# Patient Record
Sex: Female | Born: 1960 | Race: Black or African American | Hispanic: No | Marital: Single | Smoking: Former smoker
Health system: Southern US, Community
[De-identification: ages and names within clinical notes are randomized; demographics above are authoritative.]

## PROBLEM LIST (undated history)

## (undated) DIAGNOSIS — I10 Essential (primary) hypertension: Secondary | ICD-10-CM

## (undated) DIAGNOSIS — B977 Papillomavirus as the cause of diseases classified elsewhere: Secondary | ICD-10-CM

## (undated) DIAGNOSIS — M1990 Unspecified osteoarthritis, unspecified site: Secondary | ICD-10-CM

## (undated) DIAGNOSIS — K219 Gastro-esophageal reflux disease without esophagitis: Secondary | ICD-10-CM

## (undated) DIAGNOSIS — R011 Cardiac murmur, unspecified: Secondary | ICD-10-CM

## (undated) DIAGNOSIS — N939 Abnormal uterine and vaginal bleeding, unspecified: Secondary | ICD-10-CM

## (undated) DIAGNOSIS — E785 Hyperlipidemia, unspecified: Secondary | ICD-10-CM

## (undated) DIAGNOSIS — Z86010 Personal history of colonic polyps: Secondary | ICD-10-CM

## (undated) DIAGNOSIS — E119 Type 2 diabetes mellitus without complications: Secondary | ICD-10-CM

## (undated) DIAGNOSIS — F1910 Other psychoactive substance abuse, uncomplicated: Secondary | ICD-10-CM

## (undated) DIAGNOSIS — T7840XA Allergy, unspecified, initial encounter: Secondary | ICD-10-CM

## (undated) HISTORY — DX: Papillomavirus as the cause of diseases classified elsewhere: B97.7

## (undated) HISTORY — DX: Abnormal uterine and vaginal bleeding, unspecified: N93.9

## (undated) HISTORY — DX: Personal history of colonic polyps: Z86.010

## (undated) HISTORY — DX: Allergy, unspecified, initial encounter: T78.40XA

## (undated) HISTORY — DX: Essential (primary) hypertension: I10

## (undated) HISTORY — DX: Hyperlipidemia, unspecified: E78.5

## (undated) HISTORY — DX: Unspecified osteoarthritis, unspecified site: M19.90

## (undated) HISTORY — DX: Other psychoactive substance abuse, uncomplicated: F19.10

## (undated) HISTORY — DX: Type 2 diabetes mellitus without complications: E11.9

## (undated) HISTORY — PX: TONSILLECTOMY: SUR1361

## (undated) HISTORY — DX: Gastro-esophageal reflux disease without esophagitis: K21.9

## (undated) HISTORY — DX: Cardiac murmur, unspecified: R01.1

---

## 1999-01-10 ENCOUNTER — Emergency Department (HOSPITAL_COMMUNITY): Admission: EM | Admit: 1999-01-10 | Discharge: 1999-01-11 | Payer: Self-pay

## 1999-02-26 ENCOUNTER — Emergency Department (HOSPITAL_COMMUNITY): Admission: EM | Admit: 1999-02-26 | Discharge: 1999-02-26 | Payer: Self-pay | Admitting: Emergency Medicine

## 2000-04-25 ENCOUNTER — Emergency Department (HOSPITAL_COMMUNITY): Admission: EM | Admit: 2000-04-25 | Discharge: 2000-04-25 | Payer: Self-pay | Admitting: *Deleted

## 2001-04-19 ENCOUNTER — Emergency Department (HOSPITAL_COMMUNITY): Admission: EM | Admit: 2001-04-19 | Discharge: 2001-04-19 | Payer: Self-pay | Admitting: Internal Medicine

## 2001-12-15 ENCOUNTER — Ambulatory Visit (HOSPITAL_COMMUNITY): Admission: RE | Admit: 2001-12-15 | Discharge: 2001-12-15 | Payer: Self-pay | Admitting: Radiology

## 2002-05-09 ENCOUNTER — Emergency Department (HOSPITAL_COMMUNITY): Admission: EM | Admit: 2002-05-09 | Discharge: 2002-05-09 | Payer: Self-pay | Admitting: Emergency Medicine

## 2002-11-03 ENCOUNTER — Encounter: Admission: RE | Admit: 2002-11-03 | Discharge: 2002-11-03 | Payer: Self-pay | Admitting: *Deleted

## 2002-11-03 ENCOUNTER — Encounter (INDEPENDENT_AMBULATORY_CARE_PROVIDER_SITE_OTHER): Payer: Self-pay | Admitting: *Deleted

## 2002-11-03 ENCOUNTER — Other Ambulatory Visit: Admission: RE | Admit: 2002-11-03 | Discharge: 2002-11-03 | Payer: Self-pay | Admitting: Family Medicine

## 2002-12-19 ENCOUNTER — Emergency Department (HOSPITAL_COMMUNITY): Admission: EM | Admit: 2002-12-19 | Discharge: 2002-12-19 | Payer: Self-pay | Admitting: Emergency Medicine

## 2004-02-16 ENCOUNTER — Encounter (INDEPENDENT_AMBULATORY_CARE_PROVIDER_SITE_OTHER): Payer: Self-pay | Admitting: *Deleted

## 2004-02-16 ENCOUNTER — Encounter: Admission: RE | Admit: 2004-02-16 | Discharge: 2004-02-16 | Payer: Self-pay | Admitting: Family Medicine

## 2004-03-07 ENCOUNTER — Encounter: Admission: RE | Admit: 2004-03-07 | Discharge: 2004-03-07 | Payer: Self-pay | Admitting: *Deleted

## 2004-03-20 ENCOUNTER — Ambulatory Visit (HOSPITAL_COMMUNITY): Admission: RE | Admit: 2004-03-20 | Discharge: 2004-03-20 | Payer: Self-pay | Admitting: Internal Medicine

## 2004-03-20 ENCOUNTER — Encounter: Admission: RE | Admit: 2004-03-20 | Discharge: 2004-03-20 | Payer: Self-pay | Admitting: Internal Medicine

## 2004-03-21 ENCOUNTER — Encounter: Admission: RE | Admit: 2004-03-21 | Discharge: 2004-03-21 | Payer: Self-pay | Admitting: Internal Medicine

## 2004-03-22 ENCOUNTER — Encounter: Admission: RE | Admit: 2004-03-22 | Discharge: 2004-03-22 | Payer: Self-pay | Admitting: Internal Medicine

## 2004-03-27 ENCOUNTER — Encounter: Admission: RE | Admit: 2004-03-27 | Discharge: 2004-03-27 | Payer: Self-pay | Admitting: Internal Medicine

## 2004-05-16 ENCOUNTER — Ambulatory Visit: Payer: Self-pay

## 2004-05-22 ENCOUNTER — Ambulatory Visit (HOSPITAL_COMMUNITY): Admission: RE | Admit: 2004-05-22 | Discharge: 2004-05-22 | Payer: Self-pay | Admitting: Internal Medicine

## 2004-05-24 ENCOUNTER — Ambulatory Visit: Payer: Self-pay | Admitting: Internal Medicine

## 2004-11-22 ENCOUNTER — Ambulatory Visit: Payer: Self-pay | Admitting: Internal Medicine

## 2005-01-09 ENCOUNTER — Emergency Department (HOSPITAL_COMMUNITY): Admission: EM | Admit: 2005-01-09 | Discharge: 2005-01-09 | Payer: Self-pay | Admitting: Emergency Medicine

## 2005-03-21 ENCOUNTER — Ambulatory Visit: Payer: Self-pay | Admitting: Internal Medicine

## 2005-05-02 ENCOUNTER — Ambulatory Visit (HOSPITAL_COMMUNITY): Admission: RE | Admit: 2005-05-02 | Discharge: 2005-05-02 | Payer: Self-pay | Admitting: Internal Medicine

## 2005-05-02 ENCOUNTER — Ambulatory Visit: Payer: Self-pay | Admitting: Internal Medicine

## 2005-05-06 ENCOUNTER — Ambulatory Visit: Payer: Self-pay | Admitting: Internal Medicine

## 2005-06-28 ENCOUNTER — Emergency Department (HOSPITAL_COMMUNITY): Admission: EM | Admit: 2005-06-28 | Discharge: 2005-06-28 | Payer: Self-pay | Admitting: Emergency Medicine

## 2005-09-29 ENCOUNTER — Ambulatory Visit (HOSPITAL_COMMUNITY): Admission: RE | Admit: 2005-09-29 | Discharge: 2005-09-29 | Payer: Self-pay | Admitting: Internal Medicine

## 2005-12-29 ENCOUNTER — Ambulatory Visit: Payer: Self-pay | Admitting: Internal Medicine

## 2006-01-05 ENCOUNTER — Ambulatory Visit: Payer: Self-pay | Admitting: Internal Medicine

## 2006-03-16 ENCOUNTER — Ambulatory Visit: Payer: Self-pay | Admitting: Internal Medicine

## 2006-09-30 ENCOUNTER — Ambulatory Visit (HOSPITAL_COMMUNITY): Admission: RE | Admit: 2006-09-30 | Discharge: 2006-09-30 | Payer: Self-pay | Admitting: Internal Medicine

## 2006-10-12 ENCOUNTER — Ambulatory Visit: Payer: Self-pay | Admitting: Internal Medicine

## 2006-10-12 ENCOUNTER — Encounter (INDEPENDENT_AMBULATORY_CARE_PROVIDER_SITE_OTHER): Payer: Self-pay | Admitting: Internal Medicine

## 2006-10-12 LAB — CONVERTED CEMR LAB: VLDL: 15 mg/dL (ref 0–40)

## 2006-10-19 DIAGNOSIS — E785 Hyperlipidemia, unspecified: Secondary | ICD-10-CM | POA: Insufficient documentation

## 2006-10-19 DIAGNOSIS — M1990 Unspecified osteoarthritis, unspecified site: Secondary | ICD-10-CM

## 2006-10-19 DIAGNOSIS — I10 Essential (primary) hypertension: Secondary | ICD-10-CM

## 2006-10-19 DIAGNOSIS — F1910 Other psychoactive substance abuse, uncomplicated: Secondary | ICD-10-CM

## 2006-10-19 DIAGNOSIS — B977 Papillomavirus as the cause of diseases classified elsewhere: Secondary | ICD-10-CM

## 2006-10-19 DIAGNOSIS — H18609 Keratoconus, unspecified, unspecified eye: Secondary | ICD-10-CM | POA: Insufficient documentation

## 2006-10-21 ENCOUNTER — Ambulatory Visit: Payer: Self-pay | Admitting: Internal Medicine

## 2006-10-21 ENCOUNTER — Encounter (INDEPENDENT_AMBULATORY_CARE_PROVIDER_SITE_OTHER): Payer: Self-pay | Admitting: Unknown Physician Specialty

## 2006-10-27 ENCOUNTER — Encounter (INDEPENDENT_AMBULATORY_CARE_PROVIDER_SITE_OTHER): Payer: Self-pay | Admitting: Unknown Physician Specialty

## 2006-11-19 LAB — CONVERTED CEMR LAB: Pap Smear: NORMAL

## 2006-11-27 ENCOUNTER — Telehealth (INDEPENDENT_AMBULATORY_CARE_PROVIDER_SITE_OTHER): Payer: Self-pay | Admitting: *Deleted

## 2007-01-18 ENCOUNTER — Telehealth (INDEPENDENT_AMBULATORY_CARE_PROVIDER_SITE_OTHER): Payer: Self-pay | Admitting: *Deleted

## 2007-01-25 ENCOUNTER — Telehealth: Payer: Self-pay | Admitting: *Deleted

## 2007-02-14 ENCOUNTER — Emergency Department (HOSPITAL_COMMUNITY): Admission: EM | Admit: 2007-02-14 | Discharge: 2007-02-14 | Payer: Self-pay | Admitting: Family Medicine

## 2007-04-19 ENCOUNTER — Encounter (INDEPENDENT_AMBULATORY_CARE_PROVIDER_SITE_OTHER): Payer: Self-pay | Admitting: *Deleted

## 2007-04-19 ENCOUNTER — Ambulatory Visit: Payer: Self-pay | Admitting: Internal Medicine

## 2007-04-27 ENCOUNTER — Ambulatory Visit: Payer: Self-pay | Admitting: *Deleted

## 2007-04-27 ENCOUNTER — Encounter (INDEPENDENT_AMBULATORY_CARE_PROVIDER_SITE_OTHER): Payer: Self-pay | Admitting: *Deleted

## 2007-04-28 LAB — CONVERTED CEMR LAB
Alkaline Phosphatase: 64 units/L (ref 39–117)
Calcium: 8.8 mg/dL (ref 8.4–10.5)
Cholesterol: 148 mg/dL (ref 0–200)
Creatinine, Ser: 0.64 mg/dL (ref 0.40–1.20)
Glucose, Bld: 100 mg/dL — ABNORMAL HIGH (ref 70–99)
Potassium: 3.2 meq/L — ABNORMAL LOW (ref 3.5–5.3)
Triglycerides: 66 mg/dL (ref <150)
VLDL: 13 mg/dL (ref 0–40)

## 2007-06-08 ENCOUNTER — Encounter (INDEPENDENT_AMBULATORY_CARE_PROVIDER_SITE_OTHER): Payer: Self-pay | Admitting: *Deleted

## 2007-06-08 ENCOUNTER — Ambulatory Visit: Payer: Self-pay | Admitting: Hospitalist

## 2007-06-10 LAB — CONVERTED CEMR LAB
ALT: 17 units/L (ref 0–35)
CO2: 30 meq/L (ref 19–32)
Calcium: 8.7 mg/dL (ref 8.4–10.5)
Chloride: 101 meq/L (ref 96–112)
Creatinine, Ser: 0.69 mg/dL (ref 0.40–1.20)
Glucose, Bld: 110 mg/dL — ABNORMAL HIGH (ref 70–99)
Potassium: 3.3 meq/L — ABNORMAL LOW (ref 3.5–5.3)
Total Protein: 6.7 g/dL (ref 6.0–8.3)

## 2007-07-19 ENCOUNTER — Encounter (INDEPENDENT_AMBULATORY_CARE_PROVIDER_SITE_OTHER): Payer: Self-pay | Admitting: *Deleted

## 2007-07-19 ENCOUNTER — Telehealth (INDEPENDENT_AMBULATORY_CARE_PROVIDER_SITE_OTHER): Payer: Self-pay | Admitting: *Deleted

## 2007-07-30 ENCOUNTER — Telehealth (INDEPENDENT_AMBULATORY_CARE_PROVIDER_SITE_OTHER): Payer: Self-pay | Admitting: *Deleted

## 2007-07-30 ENCOUNTER — Encounter (INDEPENDENT_AMBULATORY_CARE_PROVIDER_SITE_OTHER): Payer: Self-pay | Admitting: *Deleted

## 2007-07-30 ENCOUNTER — Ambulatory Visit: Payer: Self-pay | Admitting: Internal Medicine

## 2007-07-30 LAB — CONVERTED CEMR LAB
CO2: 30 meq/L (ref 19–32)
Calcium: 9.3 mg/dL (ref 8.4–10.5)
Chloride: 100 meq/L (ref 96–112)
Magnesium: 2.1 mg/dL (ref 1.5–2.5)

## 2007-10-13 ENCOUNTER — Ambulatory Visit (HOSPITAL_COMMUNITY): Admission: RE | Admit: 2007-10-13 | Discharge: 2007-10-13 | Payer: Self-pay | Admitting: *Deleted

## 2007-11-03 ENCOUNTER — Ambulatory Visit: Payer: Self-pay | Admitting: Internal Medicine

## 2007-11-03 ENCOUNTER — Encounter (INDEPENDENT_AMBULATORY_CARE_PROVIDER_SITE_OTHER): Payer: Self-pay | Admitting: *Deleted

## 2007-11-03 LAB — CONVERTED CEMR LAB
Calcium: 8.6 mg/dL (ref 8.4–10.5)
Glucose, Bld: 96 mg/dL (ref 70–99)
Sodium: 139 meq/L (ref 135–145)

## 2007-11-10 ENCOUNTER — Ambulatory Visit: Payer: Self-pay | Admitting: Internal Medicine

## 2007-11-10 ENCOUNTER — Encounter (INDEPENDENT_AMBULATORY_CARE_PROVIDER_SITE_OTHER): Payer: Self-pay | Admitting: *Deleted

## 2008-02-15 ENCOUNTER — Ambulatory Visit: Payer: Self-pay | Admitting: Internal Medicine

## 2008-02-15 ENCOUNTER — Encounter (INDEPENDENT_AMBULATORY_CARE_PROVIDER_SITE_OTHER): Payer: Self-pay | Admitting: *Deleted

## 2008-02-16 LAB — CONVERTED CEMR LAB
AST: 16 units/L (ref 0–37)
Albumin: 4.4 g/dL (ref 3.5–5.2)
Alkaline Phosphatase: 60 units/L (ref 39–117)
Calcium: 9.2 mg/dL (ref 8.4–10.5)
Cholesterol: 180 mg/dL (ref 0–200)
Creatinine, Ser: 0.73 mg/dL (ref 0.40–1.20)
HDL: 74 mg/dL (ref >39)
LDL Cholesterol: 89 mg/dL (ref 0–99)
Total Bilirubin: 0.6 mg/dL (ref 0.3–1.2)
Total CHOL/HDL Ratio: 2.4
Total Protein: 7.5 g/dL (ref 6.0–8.3)
VLDL: 17 mg/dL (ref 0–40)

## 2008-10-31 ENCOUNTER — Telehealth: Payer: Self-pay | Admitting: Internal Medicine

## 2008-11-01 ENCOUNTER — Ambulatory Visit: Payer: Self-pay | Admitting: Internal Medicine

## 2008-11-01 ENCOUNTER — Encounter: Payer: Self-pay | Admitting: Internal Medicine

## 2008-11-01 LAB — CONVERTED CEMR LAB
Alkaline Phosphatase: 69 units/L (ref 39–117)
BUN: 10 mg/dL (ref 6–23)
Chloride: 101 meq/L (ref 96–112)
Creatinine, Ser: 0.71 mg/dL (ref 0.40–1.20)
HDL: 71 mg/dL (ref >39)
LDL Cholesterol: 70 mg/dL (ref 0–99)
Potassium: 3.6 meq/L (ref 3.5–5.3)
Total Bilirubin: 0.3 mg/dL (ref 0.3–1.2)
Total Protein: 7.5 g/dL (ref 6.0–8.3)
Triglycerides: 148 mg/dL (ref <150)

## 2008-11-14 ENCOUNTER — Encounter: Payer: Self-pay | Admitting: Internal Medicine

## 2008-12-12 ENCOUNTER — Ambulatory Visit (HOSPITAL_COMMUNITY): Admission: RE | Admit: 2008-12-12 | Discharge: 2008-12-12 | Payer: Self-pay | Admitting: *Deleted

## 2008-12-12 ENCOUNTER — Encounter: Payer: Self-pay | Admitting: *Deleted

## 2008-12-21 ENCOUNTER — Encounter: Payer: Self-pay | Admitting: Internal Medicine

## 2008-12-21 ENCOUNTER — Ambulatory Visit: Payer: Self-pay | Admitting: Internal Medicine

## 2008-12-21 LAB — CONVERTED CEMR LAB
CO2: 26 meq/L (ref 19–32)
Calcium: 8.8 mg/dL (ref 8.4–10.5)
GFR calc Af Amer: >60 mL/min (ref >60)
GFR calc non Af Amer: >60 mL/min (ref >60)
Potassium: 3.8 meq/L (ref 3.5–5.3)

## 2009-02-13 DIAGNOSIS — N939 Abnormal uterine and vaginal bleeding, unspecified: Secondary | ICD-10-CM

## 2009-02-13 HISTORY — DX: Abnormal uterine and vaginal bleeding, unspecified: N93.9

## 2009-03-06 ENCOUNTER — Ambulatory Visit: Payer: Self-pay | Admitting: Internal Medicine

## 2009-03-06 DIAGNOSIS — N926 Irregular menstruation, unspecified: Secondary | ICD-10-CM

## 2009-03-06 DIAGNOSIS — N939 Abnormal uterine and vaginal bleeding, unspecified: Secondary | ICD-10-CM

## 2009-03-20 ENCOUNTER — Telehealth: Payer: Self-pay | Admitting: Internal Medicine

## 2009-03-26 ENCOUNTER — Telehealth: Payer: Self-pay | Admitting: Internal Medicine

## 2009-04-05 ENCOUNTER — Encounter: Payer: Self-pay | Admitting: Internal Medicine

## 2009-04-06 ENCOUNTER — Ambulatory Visit (HOSPITAL_COMMUNITY): Admission: RE | Admit: 2009-04-06 | Discharge: 2009-04-06 | Payer: Self-pay | Admitting: Obstetrics & Gynecology

## 2009-07-13 ENCOUNTER — Ambulatory Visit: Payer: Self-pay | Admitting: Internal Medicine

## 2009-07-19 ENCOUNTER — Ambulatory Visit: Payer: Self-pay | Admitting: Internal Medicine

## 2009-07-19 LAB — CONVERTED CEMR LAB
ALT: 20 units/L (ref 0–35)
Albumin: 4.5 g/dL (ref 3.5–5.2)
Alkaline Phosphatase: 65 units/L (ref 39–117)
CO2: 26 meq/L (ref 19–32)
Calcium: 9.3 mg/dL (ref 8.4–10.5)
Creatinine, Ser: 0.64 mg/dL (ref 0.40–1.20)
Glucose, Bld: 98 mg/dL (ref 70–99)
Sodium: 139 meq/L (ref 135–145)
Total Bilirubin: 0.4 mg/dL (ref 0.3–1.2)
Total Protein: 7.3 g/dL (ref 6.0–8.3)
VLDL: 16 mg/dL (ref 0–40)

## 2009-07-26 ENCOUNTER — Encounter: Payer: Self-pay | Admitting: Internal Medicine

## 2009-08-28 ENCOUNTER — Telehealth: Payer: Self-pay | Admitting: Internal Medicine

## 2009-11-26 ENCOUNTER — Encounter: Payer: Self-pay | Admitting: Internal Medicine

## 2009-12-03 ENCOUNTER — Ambulatory Visit: Payer: Self-pay | Admitting: Internal Medicine

## 2009-12-04 ENCOUNTER — Encounter: Payer: Self-pay | Admitting: Internal Medicine

## 2009-12-04 LAB — CONVERTED CEMR LAB
ALT: 22 units/L (ref 0–35)
Albumin: 4.3 g/dL (ref 3.5–5.2)
Alkaline Phosphatase: 65 units/L (ref 39–117)
BUN: 8 mg/dL (ref 6–23)
CO2: 27 meq/L (ref 19–32)
Calcium: 9.7 mg/dL (ref 8.4–10.5)
Chloride: 101 meq/L (ref 96–112)
Glucose, Bld: 116 mg/dL — ABNORMAL HIGH (ref 70–99)
Total Protein: 7 g/dL (ref 6.0–8.3)

## 2009-12-18 ENCOUNTER — Ambulatory Visit (HOSPITAL_COMMUNITY): Admission: RE | Admit: 2009-12-18 | Discharge: 2009-12-18 | Payer: Self-pay | Admitting: Internal Medicine

## 2009-12-18 LAB — HM MAMMOGRAPHY

## 2010-03-19 ENCOUNTER — Ambulatory Visit: Payer: Self-pay | Admitting: Internal Medicine

## 2010-03-21 ENCOUNTER — Ambulatory Visit: Payer: Self-pay | Admitting: Internal Medicine

## 2010-03-21 LAB — CONVERTED CEMR LAB
ALT: 16 units/L (ref 0–35)
AST: 17 units/L (ref 0–37)
Albumin: 4.1 g/dL (ref 3.5–5.2)
Alkaline Phosphatase: 56 units/L (ref 39–117)
BUN: 9 mg/dL (ref 6–23)
Calcium: 8.8 mg/dL (ref 8.4–10.5)
Cholesterol: 168 mg/dL (ref 0–200)
Glucose, Bld: 97 mg/dL (ref 70–99)
LDL Cholesterol: 75 mg/dL (ref 0–99)
Potassium: 4.1 meq/L (ref 3.5–5.3)
Total Protein: 6.7 g/dL (ref 6.0–8.3)
Triglycerides: 113 mg/dL (ref <150)

## 2010-03-25 ENCOUNTER — Encounter (INDEPENDENT_AMBULATORY_CARE_PROVIDER_SITE_OTHER): Payer: Self-pay | Admitting: *Deleted

## 2010-03-27 ENCOUNTER — Ambulatory Visit: Payer: Self-pay | Admitting: Internal Medicine

## 2010-06-18 ENCOUNTER — Telehealth: Payer: Self-pay | Admitting: Internal Medicine

## 2010-07-02 ENCOUNTER — Telehealth (INDEPENDENT_AMBULATORY_CARE_PROVIDER_SITE_OTHER): Payer: Self-pay | Admitting: *Deleted

## 2010-07-10 ENCOUNTER — Ambulatory Visit: Payer: Self-pay | Admitting: Internal Medicine

## 2010-09-26 ENCOUNTER — Ambulatory Visit: Admit: 2010-09-26 | Payer: Self-pay

## 2010-10-17 ENCOUNTER — Other Ambulatory Visit: Payer: Self-pay | Admitting: *Deleted

## 2010-10-17 MED ORDER — PRAVASTATIN SODIUM 20 MG PO TABS: 20.0000 mg | ORAL_TABLET | Freq: Every evening | ORAL | Status: DC

## 2010-10-17 NOTE — Progress Notes (Signed)
Summary: Refill/gh  Phone Note Refill Request Message from:  Patient on July 02, 2010 9:01 AM  Refills Requested: Medication #1:  OSCAL 500/200 D-3 500-200 MG-UNIT TABS take 1 tablet twice a day.. Last labs and visit were 03/19/2010 annd 03/21/2010.   Method Requested: Fax to Local Pharmacy Initial call taken by: Angelina Ok RN,  July 02, 2010 9:01 AM  Follow-up for Phone Call        Rx faxed to pharmacy Follow-up by: Mariea Stable MD,  July 02, 2010 9:04 AM    Prescriptions: OSCAL 500/200 D-3 500-200 MG-UNIT TABS (CALCIUM-VITAMIN D) take 1 tablet twice a day.  #60 x 1   Entered and Authorized by:   Mariea Stable MD   Signed by:   Mariea Stable MD on 07/02/2010   Method used:   [facility]        [facility] [phone]* (retail)       [address]       [place], [place]  [address]       Ph: [phone] or [phone]       Fax: [phone]   RxID:   [id number]

## 2010-10-17 NOTE — Assessment & Plan Note (Signed)
Summary: REASSIGNED NEW TO DR/CFB   Vital Signs:  Patient profile:   50 year old female Height:      68.5 inches (173.99 cm) Weight:      197.2 pounds (89.64 kg) BMI:     29.65 Temp:     97.2 degrees F (36.22 degrees C) oral Pulse rate:   81 / minute BP sitting:   141 / 89  (right arm) Cuff size:   regular  Vitals Entered By: Cynda Familia Duncan Dull) (March 19, 2010 3:35 PM) CC: routine f/u, new to md Is Patient Diabetic? No Pain Assessment Patient in pain? no      Nutritional Status BMI of > 30 = obese  Have you ever been in a relationship where you felt threatened, hurt or afraid?No   Does patient need assistance? Functional Status Self care, Social activities Ambulation Normal   Primary Care Provider:  Hartley Barefoot MD  CC:  routine f/u and new to md.  History of Present Illness: Pt is a 50 yo AAF w/ h/o HTN, HL and OA here for a routine f/u and prescription refil. She has no complaints, has been doing very well clinically since her last visit and has been compliant with her meds.        HTN: well controlled and at goal on Duazide. Blood pressure today is 132/84, although she states she did not take her medication earlier today because she ran out but plans to refill her prescription on her way home.  HL: Last lipid panel obtained in 06/2009 was great (total chol-152, HDL-72, LDL-64, triglycerides- 81).  However her statin was changed in 08/2009 to Pravastatin 20mg  and so we will be checking a fasting lipid panel.   Habits & Providers  Alcohol-Tobacco-Diet     Tobacco Status: current     Tobacco Counseling: to quit use of tobacco products     Cigarette Packs/Day: 1/2     Year Quit: 02/11/08   Allergies (verified): No Known Drug Allergies   Impression & Recommendations:  Problem # 1:  HYPERTENSION (ICD-401.9)  HTN: well controlled and at goal on Dyazide. Blood pressure today is 132/84, although she states she did not take her medication earlier today  because she ran out but plans to refill her prescription on her way home.     Her updated medication list for this problem includes:    Dyazide 37.5-25 Mg Caps (Triamterene-hctz) .Marland Kitchen... Take 1 tablet by mouth once a day  BP today: 141/89 Prior BP: 132/73 (12/03/2009)  10 Yr Risk Heart Disease: 7 %  Labs Reviewed: K+: 3.2 (12/03/2009) Creat: : 0.68 (12/03/2009)   Chol: 152 (07/19/2009)   HDL: 72 (07/19/2009)   LDL: 64 (07/19/2009)   TG: 81 (07/19/2009)  Problem # 2:  HYPERLIPIDEMIA (ICD-272.4)  Last lipid panel obtained in 06/2009 was great (total chol-152, HDL-72, LDL-64, triglycerides- 81).  However her statin was changed in 08/2009 to Pravastatin 20mg  and so we will be checking a fasting lipid panel. She  Her updated medication list for this problem includes:    Pravastatin Sodium 20 Mg Tabs (Pravastatin sodium) .Marland Kitchen... Take 1 tablet by mouth daily.  Orders: T-Lipid Profile [phone])  Labs Reviewed: SGOT: 19 (12/03/2009)   SGPT: 22 (12/03/2009)  Lipid Goals: LDL Goal: 160 (03/19/2010)     10 Yr Risk Heart Disease: 7 %   HDL:72 (07/19/2009), 71 (11/01/2008)  LDL:64 (07/19/2009), 70 (11/01/2008)  Chol:152 (07/19/2009), 171 (11/01/2008)  Trig:81 (07/19/2009), 148 (11/01/2008)  Problem # 3:  HEALTH SCREENING (ICD-V70.0) Pap smear (3/11) - Normal Smoking cessation counseling  Complete Medication List: 1)  Pravastatin Sodium 20 Mg Tabs (Pravastatin sodium) .... Take 1 tablet by mouth daily. 2)  Dyazide 37.5-25 Mg Caps (Triamterene-hctz) .... Take 1 tablet by mouth once a day 3)  Oscal 500/200 D-3 500-200 Mg-unit Tabs (Calcium-vitamin d) .... Take 1 tablet twice a day.  Other Orders: T-Hemoccult Card-Multiple (take home) ([id number]) Gastroenterology Referral (GI) T-Comprehensive Metabolic Panel ([id number])  Patient Instructions: 1)  Pls schedule appointment for fasting labs in the next few days. Also schedule an appointment for a followup clinic appointment in 3 months. 2)   Pls schedule an appointment with our smoking cessation counselor.  3)  Call if there are any problems.  Prevention & Chronic Care Immunizations   Influenza vaccine: Not documented   Influenza vaccine deferral: Deferred  (03/19/2010)   Influenza vaccine due: 05/16/2010    Tetanus booster: 03/06/2009: Td   Td booster deferral: Deferred  (03/19/2010)    Pneumococcal vaccine: Not documented   Pneumococcal vaccine deferral: Refused  (03/06/2009)  Colorectal Screening   Hemoccult: Not documented   Hemoccult action/deferral: Ordered  (03/19/2010)    Colonoscopy: Not documented   Colonoscopy action/deferral: GI referral  (03/19/2010)  Other Screening   Pap smear:  Specimen Adequacy: Satisfactory for evaluation.   Interpretation/Result:Negative for intraepithelial Lesion or Malignancy.     (11/26/2009)   Pap smear action/deferral: GYN Referral  (03/19/2010)   Pap smear due: 11/2010    Mammogram: ASSESSMENT: Negative - BI-RADS 1^MM DIGITAL SCREENING  (12/18/2009)   Mammogram action/deferral: Ordered  (12/03/2009)   Mammogram due: 10/2007   Smoking status: current  (03/19/2010)   Smoking cessation counseling: yes  (03/19/2010)  Lipids   Total Cholesterol: 152  (07/19/2009)   Lipid panel action/deferral: Lipid Panel ordered   LDL: 64  (07/19/2009)   LDL Direct: Not documented   HDL: 72  (07/19/2009)   Triglycerides: 81  (07/19/2009)   Lipid panel due: 11/01/2009    SGOT (AST): 19  (12/03/2009)   BMP action: Ordered   SGPT (ALT): 22  (12/03/2009) CMP ordered    Alkaline phosphatase: 65  (12/03/2009)   Total bilirubin: 0.4  (12/03/2009)    Lipid flowsheet reviewed?: Yes   Progress toward LDL goal: At goal  Hypertension   Last Blood Pressure: 141 / 89  (03/19/2010)   Serum creatinine: 0.68  (12/03/2009)   BMP action: Ordered   Serum potassium 3.2  (12/03/2009) CMP ordered    Basic metabolic panel due: 03/06/2010    Hypertension flowsheet reviewed?: Yes   Progress  toward BP goal: At goal  Self-Management Support :   Personal Goals (by the next clinic visit) :      Personal blood pressure goal: 140/90  (03/06/2009)   Patient will work on the following items until the next clinic visit to reach self-care goals:     Medications and monitoring: take my medicines every day  (03/19/2010)     Eating: drink diet soda or water instead of juice or soda, eat more vegetables, eat foods that are low in salt, eat baked foods instead of fried foods  (03/19/2010)     Activity: take a 30 minute walk every day  (03/19/2010)    Hypertension self-management support: Pre-printed educational material, Resources for patients handout, Written self-care plan  (03/19/2010)   Hypertension self-care plan printed.    Lipid self-management support: Pre-printed educational material, Resources for patients handout, Written self-care plan  (03/19/2010)  Lipid self-care plan printed.      Resource handout printed.   Nursing Instructions: GI  prefers am appt Provide Hemoccult cards with instructions (see order)   Process Orders Check Orders Results:     [facility]: ABN not required for this insurance Tests Sent for requisitioning (March 26, 2010 9:53 AM):     03/19/2010: [facility] -- T-Lipid Profile [phone] (signed)     03/19/2010: [facility] -- T-Comprehensive Metabolic Panel [phone] (signed)     Pap Smear  Procedure date:  11/26/2009  Findings:       Specimen Adequacy: Satisfactory for evaluation.   Interpretation/Result:Negative for intraepithelial Lesion or Malignancy.     Comments:      Repeat Pap in 1 year.    Procedures Next Due Date:    Pap Smear: 11/2010

## 2010-10-17 NOTE — Assessment & Plan Note (Signed)
Summary: ACUTE-LEG PAIN(ISAMAH)/CFB   Vital Signs:  Patient profile:   50 year old female Height:      68.5 inches (173.99 cm) Weight:      198.8 pounds (89.64 kg) BMI:     29.65 Temp:     97.7 degrees F (36.50 degrees C) oral Pulse rate:   77 / minute BP sitting:   128 / 71  (left arm) Cuff size:   regular  Vitals Entered By: Theotis Barrio NT II (July 10, 2010 11:58 AM) CC: left leg pain / ? lab work to be done Is Patient Diabetic? No Pain Assessment Patient in pain? yes     Location: left leg Intensity: 8 Type: aching Onset of pain  last thursday  Have you ever been in a relationship where you felt threatened, hurt or afraid?No   Does patient need assistance? Functional Status Self care Ambulation Normal Comments left leg pain / ? lab work to be done    Primary Care Provider:  Hartley Barefoot MD  CC:  left leg pain / ? lab work to be done.  History of Present Illness: 50 yr old woman with pmhx as described below comes to the clinic complaining left leg pain since last thursday. Patient has been to  the clinic before for this pain. In the past was told that it was due to arthritis. Associated with stiffness. Patient has not been taking anything for pain.   Preventive Screening-Counseling & Management  Alcohol-Tobacco     Smoking Status: current     Smoking Cessation Counseling: yes     Packs/Day: 1/2     Year Quit: 02/11/08      Tobacco Counseling: to quit use of tobacco products  Caffeine-Diet-Exercise     Does Patient Exercise: yes     Type of exercise: walk 30 minutes     Times/week: 3  Problems Prior to Update: 1)  Vaginal Bleeding, Abnormal  (ICD-626.9) 2)  Allergic Rhinitis  (ICD-477.9) 3)  Hypertension  (ICD-401.9) 4)  Hyperlipidemia  (ICD-272.4) 5)  Osteoarthritis  (ICD-715.90) 6)  Health Screening  (ICD-V70.0) 7)  Substance Abuse, Multiple  (ICD-305.90) 8)  Hx, Personal, Tobacco Use  (ICD-V15.82) 9)  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 Take 1 Tablet By Mouth Daily. 2)  Dyazide 37.5-25 Mg  Caps (Triamterene-Hctz) .... Take 1 Tablet By Mouth Once A Day 3)  Oscal 500/200 D-3 500-200 Mg-Unit Tabs (Calcium-Vitamin D) .... Take 1 Tablet Twice A Day.  Current Medications (verified): 1)  Pravastatin Sodium 20 Mg Tabs (Pravastatin Sodium) .... Take 1 Tablet By Mouth Daily. 2)  Dyazide 37.5-25 Mg  Caps (Triamterene-Hctz) .... Take 1 Tablet By Mouth Once A Day 3)  Oscal 500/200 D-3 500-200 Mg-Unit Tabs (Calcium-Vitamin D) .... Take 1 Tablet Twice A Day.  Allergies: No Known Drug Allergies  Past History:  Past Medical History: Last updated: 10/21/2006 Hyperlipidemia Hypertension Osteoarthritis H/o substance abuse- Tobacco, marijuana H/o Keratoconus- f/b Dr. 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  Family History: Last updated: 2008-03-05 Father: deceased appendicitis, otherwise healthy Mother: healthy Siblings: healthy  Social History: Last updated: 04/19/2007 Marital Status: divorced Children: 2 girls- 30, 26 1 boy-16 Occupation: Engineer, site-- Server in cafeteria  Risk Factors: Exercise: yes (07/10/2010)  Risk Factors: Smoking Status: current (07/10/2010) Packs/Day: 1/2 (07/10/2010)  Family History: Reviewed history from 02/15/2008 and no changes required. Father: deceased appendicitis, otherwise healthy Mother: healthy Siblings: healthy  Social History: Reviewed history from 04/19/2007 and no changes required. Marital Status: divorced Children: 2 girls- 54, 45 1 boy-16 Occupation: Acupuncturist in [employer]  Review of Systems  The patient denies fever, chest pain, dyspnea on exertion, hemoptysis, abdominal pain, melena, hematochezia, hematuria, muscle weakness, and difficulty walking.     Physical Exam  General:  NAD Mouth:  MMM Neck:  supple.   Lungs:  normal respiratory effort, no intercostal retractions, no accessory muscle use, and normal breath sounds.   Heart:  normal rate and regular rhythm.   Abdomen:  soft, non-tender, normal bowel sounds, and no distention.   Msk:  normal ROM, no joint swelling, no joint warmth, and no redness over joints.   Extremities:  no edema Neurologic:  alert & oriented X3.     Impression & Recommendations:  Problem # 1:  OSTEOARTHRITIS (ICD-715.90) OA left hip. Most likely causing patient's leg pain. Start patient on naproxen and follow up.  Her updated medication list for this problem includes:    Naproxen 500 Mg Tabs (Naproxen) .Marland Kitchen... Take 1 tablet by mouth two times a day as needed for pain  Problem # 2:  HYPERTENSION (ICD-401.9) Controlled. Continue current regimen.  Her updated medication list for this problem includes:    Dyazide 37.5-25 Mg Caps (Triamterene-hctz) .Marland Kitchen... Take 1 tablet by mouth once a day  BP today: 128/21 Prior BP: 141/89 (03/19/2010)  Prior 10 Yr Risk Heart Disease: 7 % (03/19/2010)  Labs Reviewed: K+: 4.1 (03/21/2010) Creat: : 0.67 (03/21/2010)   Chol: 168 (03/21/2010)   HDL: 70 (03/21/2010)   LDL: 75 (03/21/2010)   TG: 113 (03/21/2010)  Complete Medication List: 1)  Pravastatin Sodium 20 Mg Tabs (Pravastatin sodium) .... Take 1 tablet by mouth daily. 2)  Dyazide 37.5-25 Mg Caps (Triamterene-hctz) .... Take 1 tablet by mouth once a day 3)  Oscal 500/200 D-3 500-200 Mg-unit Tabs (Calcium-vitamin d) .... Take 1 tablet twice a day. 4)  Naproxen 500 Mg Tabs (Naproxen) .... Take 1 tablet by mouth two times a day as needed for pain  Patient Instructions: 1)  Please schedule a follow-up appointment in 3 months. 2)  Take all medication as directed.  Prescriptions: NAPROXEN 500 MG TABS (NAPROXEN) Take 1 tablet by mouth two times a day  #30 x 2   Entered and Authorized by:   Laren Everts MD    Signed by:   Laren Everts MD on 07/10/2010   Method used:   Faxed to ...       [facility] (retail)       [address]       [place], [place]  [address]       Ph: [phone]       Fax: [phone]   RxID:   [id number]    Orders Added: 1)  Est. Patient Level III [25366]     Prevention & Chronic Care Immunizations   Influenza vaccine: Not documented   Influenza vaccine deferral: Deferred  (03/19/2010)   Influenza vaccine due: 05/16/2010    Tetanus booster: 03/06/2009: Td   Td booster deferral: Deferred  (03/19/2010)    Pneumococcal vaccine: Not documented   Pneumococcal vaccine deferral: Refused  (03/06/2009)  Colorectal Screening   Hemoccult: Not documented   Hemoccult action/deferral: Ordered  (03/19/2010)    Colonoscopy: Not documented   Colonoscopy action/deferral: GI  referral  (03/19/2010)  Other Screening   Pap smear:  Specimen Adequacy: Satisfactory for evaluation.   Interpretation/Result:Negative for intraepithelial Lesion or Malignancy.     (11/26/2009)   Pap smear action/deferral: GYN Referral  (03/19/2010)   Pap smear due: 11/2010    Mammogram: ASSESSMENT: Negative - BI-RADS 1^MM DIGITAL SCREENING  (12/18/2009)   Mammogram action/deferral: Ordered  (12/03/2009)   Mammogram due: 10/2007   Smoking status: current  (07/10/2010)   Smoking cessation counseling: yes  (07/10/2010)  Lipids   Total Cholesterol: 168  (03/21/2010)   Lipid panel action/deferral: Lipid Panel ordered   LDL: 75  (03/21/2010)   LDL Direct: Not documented   HDL: 70  (03/21/2010)   Triglycerides: 113  (03/21/2010)   Lipid panel due: 11/01/2009    SGOT (AST): 17  (03/21/2010)   BMP action: Ordered   SGPT (ALT): 16  (03/21/2010)   Alkaline phosphatase: 56  (03/21/2010)   Total bilirubin: 0.4  (03/21/2010)    Lipid flowsheet reviewed?: Yes   Progress toward LDL goal: At goal  Hypertension   Last Blood Pressure: 128 / 71   (07/10/2010)   Serum creatinine: 0.67  (03/21/2010)   BMP action: Ordered   Serum potassium 4.1  (03/21/2010)   Basic metabolic panel due: 03/06/2010    Hypertension flowsheet reviewed?: Yes   Progress toward BP goal: At goal  Self-Management Support :   Personal Goals (by the next clinic visit) :      Personal blood pressure goal: 140/90  (03/06/2009)     Personal LDL goal: 100  (07/10/2010)    Patient will work on the following items until the next clinic visit to reach self-care goals:     Medications and monitoring: take my medicines every day, bring all of my medications to every visit  (07/10/2010)     Eating: drink diet soda or water instead of juice or soda, eat more vegetables, use fresh or frozen vegetables, eat baked foods instead of fried foods, eat fruit for snacks and desserts, limit or avoid alcohol  (07/10/2010)     Activity: take a 30 minute walk every day  (07/10/2010)    Hypertension self-management support: Resources for patients handout  (07/10/2010)    Lipid self-management support: Resources for patients handout  (07/10/2010)        Resource handout printed.   Appended Document: ACUTE-LEG PAIN(ISAMAH)/CFB Naproxen Rx called to [facility] pharmacy; pt. was called and made awared.

## 2010-10-17 NOTE — Progress Notes (Signed)
Summary: refill/gg  Phone Note Refill Request  on June 18, 2010 11:15 AM  Refills Requested: Medication #1:  DYAZIDE 37.5-25 MG  CAPS Take 1 tablet by mouth once a day   Last Refilled: 05/19/2010  Method Requested: Electronic Initial call taken by: Merrie Roof RN,  June 18, 2010 11:15 AM  Follow-up for Phone Call        Rx faxed to pharmacy Follow-up by: Mariea Stable MD,  June 18, 2010 11:19 AM    Prescriptions: Ilsa Iha 37.5-25 MG  CAPS (TRIAMTERENE-HCTZ) Take 1 tablet by mouth once a day  #30 x 5   Entered and Authorized by:   Mariea Stable MD   Signed by:   Mariea Stable MD on 06/18/2010   Method used:   [facility]        [facility] [phone]* (retail)       [address]       [place], [place]  [address]       Ph: [phone] or [phone]       Fax: [phone]   RxID:   [id number]

## 2010-10-17 NOTE — Miscellaneous (Signed)
Summary: [facility]  Clinical Lists Changes  Medications: Added new medication of MOVIPREP 100 GM  SOLR (PEG-KCL-NACL-NASULF-NA ASC-C) As per prep instructions. - Signed Rx of MOVIPREP 100 GM  SOLR (PEG-KCL-NACL-NASULF-NA ASC-C) As per prep instructions.;  #1 x 0;  Signed;  Entered by: Ezra Sites RN;  Authorized by: Hilarie Fredrickson MD;  Method used: [facility] [phone]*, [address], [place], [place]  [address], Ph: [phone] or [phone], Fax: [phone] Observations: Added new observation of NKA: T (03/27/2010 12:46)    Prescriptions: MOVIPREP 100 GM  SOLR (PEG-KCL-NACL-NASULF-NA ASC-C) As per prep instructions.  #1 x 0   Entered by:   Ezra Sites RN   Authorized by:   Hilarie Fredrickson MD   Signed by:   Ezra Sites RN on 03/27/2010   Method used:   [facility] to        [facility] [phone]* (retail)       [address]       [place], [place]  [address]       Ph: [phone] or [phone]       Fax: [phone]   RxID:   [id number]

## 2010-10-17 NOTE — Miscellaneous (Signed)
  Clinical Lists Changes Hypokalemia: I will give Klor supplement for 5 days.  Medications: Added new medication of KLOR-CON 10 10 MEQ CR-TABS (POTASSIUM CHLORIDE) Take 2 tablet twice a day for 5 days. - Signed Rx of KLOR-CON 10 10 MEQ CR-TABS (POTASSIUM CHLORIDE) Take 2 tablet twice a day for 5 days.;  #20 x 0;  Signed;  Entered by: Hartley Barefoot MD;  Authorized by: Hartley Barefoot MD;  Method used: Telephoned to [facility], [address] [place], [place], [place]  [address], Ph: [phone], Fax: [phone]    Prescriptions: KLOR-CON 10 10 MEQ CR-TABS (POTASSIUM CHLORIDE) Take 2 tablet twice a day for 5 days.  #20 x 0   Entered and Authorized by:   Hartley Barefoot MD   Signed by:   Hartley Barefoot MD on 12/04/2009   Method used:   Telephoned to ...       [facility] (retail)       [address] [place], [place]  [address]       Ph: [phone]       Fax: [phone]   RxID:   [id number]

## 2010-10-17 NOTE — Letter (Signed)
Summary: [facility] Instructions  [facility]  [address] [place], [place] [address]   Phone: [phone]  Fax: [phone]       Cassandra Park    1961/05/25    MRN: [id number]        Procedure Day /Date:  Friday 04/05/2010     Arrival Time: 8:30 am      Procedure Time: 9:30 am     Location of Procedure:                    _x _  [facility] (4th Floor)                        PREPARATION FOR COLONOSCOPY WITH MOVIPREP   Starting 5 days prior to your procedure Sunday 7/17 do not eat nuts, seeds, popcorn, corn, beans, peas,  salads, or any raw vegetables.  Do not take any fiber supplements (e.g. Metamucil, Citrucel, and Benefiber).  THE DAY BEFORE YOUR PROCEDURE         DATE: Thursday 7/21  1.  Drink clear liquids the entire day-NO SOLID FOOD  2.  Do not drink anything colored red or purple.  Avoid juices with pulp.  No orange juice.  3.  Drink at least 64 oz. (8 glasses) of fluid/clear liquids during the day to prevent dehydration and help the prep work efficiently.  CLEAR LIQUIDS INCLUDE: Water Jello Ice Popsicles Tea (sugar ok, no milk/cream) Powdered fruit flavored drinks Coffee (sugar ok, no milk/cream) Gatorade Juice: apple, white grape, white cranberry  Lemonade Clear bullion, consomm, broth Carbonated beverages (any kind) Strained chicken noodle soup Hard Candy                             4.  In the morning, mix first dose of MoviPrep solution:    Empty 1 Pouch A and 1 Pouch B into the disposable container    Add lukewarm drinking water to the top line of the container. Mix to dissolve    Refrigerate (mixed solution should be used within 24 hrs)  5.  Begin drinking the prep at 5:00 p.m. The MoviPrep container is divided by 4 marks.   Every 15 minutes drink the solution down to the next mark (approximately 8 oz) until the full liter is complete.   6.  Follow completed prep with 16 oz of clear liquid of your choice (Nothing  red or purple).  Continue to drink clear liquids until bedtime.  7.  Before going to bed, mix second dose of MoviPrep solution:    Empty 1 Pouch A and 1 Pouch B into the disposable container    Add lukewarm drinking water to the top line of the container. Mix to dissolve    Refrigerate  THE DAY OF YOUR PROCEDURE      DATE: Friday 7/22  Beginning at 4:30 a.m. (5 hours before procedure):         1. Every 15 minutes, drink the solution down to the next mark (approx 8 oz) until the full liter is complete.  2. Follow completed prep with 16 oz. of clear liquid of your choice.    3. You may drink clear liquids until 7:30 am (2 HOURS BEFORE PROCEDURE).   MEDICATION INSTRUCTIONS  Unless otherwise instructed, you should take regular prescription medications with a small sip of water   as early as possible the morning of  your procedure.    Additional medication instructions:  Do not take Triam/HCTZ day of procedure.         OTHER INSTRUCTIONS  You will need a responsible adult at least 52 years of age to accompany you and drive you home.   This person must remain in the waiting room during your procedure.  Wear loose fitting clothing that is easily removed.  Leave jewelry and other valuables at home.  However, you may wish to bring a book to read or  an iPod/MP3 player to listen to music as you wait for your procedure to start.  Remove all body piercing jewelry and leave at home.  Total time from sign-in until discharge is approximately 2-3 hours.  You should go home directly after your procedure and rest.  You can resume normal activities the  day after your procedure.  The day of your procedure you should not:   Drive   Make legal decisions   Operate machinery   Drink alcohol   Return to work  You will receive specific instructions about eating, activities and medications before you leave.    The above instructions have been reviewed and explained to me by    Ezra Sites RN  March 27, 2010 1:02 PM     I fully understand and can verbalize these instructions _____________________________ Date _________

## 2010-10-17 NOTE — Assessment & Plan Note (Signed)
Summary: CHECKUP/SB.   Vital Signs:  Patient profile:   50 year old female Height:      68.5 inches Weight:      199.7 pounds BMI:     30.03 Temp:     98.1 degrees F oral Pulse rate:   95 / minute BP sitting:   132 / 73  (right arm)  Vitals Entered By: Filomena Jungling NT II (December 03, 2009 3:10 PM) CC: need  refill Is Patient Diabetic? No Pain Assessment Patient in pain? no      Nutritional Status BMI of > 30 = obese  Does patient need assistance? Functional Status Self care Ambulation Normal   Primary Care Provider:  Hartley Barefoot MD  CC:  need  refill.  History of Present Illness: 50 year old with Past Medical History: Hyperlipidemia Hypertension Osteoarthritis H/o substance abuse- Tobacco, marijuana H/o Keratoconus- f/b Dr. 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   She is doing exercise and working on diet. She is feeling good. Denies chest pain or dyspnea. She has been taking her medications. No adversed effect from medications. She needs refills. Her mestrual period has stop since January. Her irregular Bleed was 2 to perimenopause per GYN.  Preventive Screening-Counseling & Management  Alcohol-Tobacco     Smoking Status: current     Smoking Cessation Counseling: yes     Packs/Day: 1/2     Year Quit: 02/11/08   Caffeine-Diet-Exercise     Does Patient Exercise: yes     Type of exercise: walk 30 minutes     Times/week: 3  Current Medications (verified): 1)  Pravastatin Sodium 20 Mg Tabs (Pravastatin Sodium) .... Take 1 Tablet By Mouth Daily. 2)  Dyazide 37.5-25 Mg  Caps (Triamterene-Hctz) .... Take 1 Tablet By Mouth Once A Day 3)  Oscal 500/200 D-3 500-200 Mg-Unit Tabs (Calcium-Vitamin D) .... Take 1 Tablet Twice A Day.  Allergies: No Known Drug Allergies  Review of Systems  The patient denies fever, chest pain, dyspnea on exertion, peripheral edema, prolonged cough,  headaches, hemoptysis, abdominal pain, and melena.    Physical Exam  General:  alert, well-developed, and well-nourished.   Head:  normocephalic, atraumatic, and no abnormalities observed.   Lungs:  normal respiratory effort, no intercostal retractions, no accessory muscle use, and normal breath sounds.   Heart:  normal rate and regular rhythm.   Abdomen:  soft, non-tender, normal bowel sounds, and no distention.   Extremities:  no edema.   Impression & Recommendations:  Problem # 1:  HYPERTENSION (ICD-401.9) Her blood pressure has improved since prior visit. I will continue with current regimen. I will check Bmet today. Her updated medication list for this problem includes:    Dyazide 37.5-25 Mg Caps (Triamterene-hctz) .Marland Kitchen... Take 1 tablet by mouth once a day  Orders: T-Comprehensive Metabolic Panel ([id number])  BP today: 132/73 Prior BP: 139/85 (07/13/2009)  Labs Reviewed: K+: 3.8 (07/19/2009) Creat: : 0.64 (07/19/2009)   Chol: 152 (07/19/2009)   HDL: 72 (07/19/2009)   LDL: 64 (07/19/2009)   TG: 81 (07/19/2009)  Problem # 2:  HYPERLIPIDEMIA (ICD-272.4) I will check liver function test. Will check fasting lipid next visit. Her updated medication list for this problem includes:    Pravastatin Sodium 20 Mg Tabs (Pravastatin sodium) .Marland Kitchen... Take 1 tablet by mouth daily.  Orders: T-Comprehensive Metabolic Panel ([id number])  Problem # 3:  Preventive Health Care (ICD-V70.0) I will get mammogram.  She want to refer colonoscopy for the summer.   Problem # 4:  SUBSTANCE ABUSE, MULTIPLE (ICD-305.90) I counsel her for smoking cessation.   Complete Medication List: 1)  Pravastatin Sodium 20 Mg Tabs (Pravastatin sodium) .... Take 1 tablet by mouth daily. 2)  Dyazide 37.5-25 Mg Caps (Triamterene-hctz) .... Take 1 tablet by mouth once a day 3)  Oscal 500/200 D-3 500-200 Mg-unit Tabs (Calcium-vitamin d) .... Take 1 tablet twice a day.  Other Orders: Mammogram (Screening)  (Mammo)  Patient Instructions: 1)  Please schedule a follow-up appointment in 3 months. Prescriptions: DYAZIDE 37.5-25 MG  CAPS (TRIAMTERENE-HCTZ) Take 1 tablet by mouth once a day  #30 x 5   Entered and Authorized by:   Hartley Barefoot MD   Signed by:   Hartley Barefoot MD on 12/03/2009   Method used:   Print then Give to Patient   RxID:   [id number] PRAVASTATIN SODIUM 20 MG TABS (PRAVASTATIN SODIUM) Take 1 tablet by mouth daily.  #30 x 6   Entered and Authorized by:   Hartley Barefoot MD   Signed by:   Hartley Barefoot MD on 12/03/2009   Method used:   Print then Give to Patient   RxID:   [id number] DYAZIDE 37.5-25 MG  CAPS (TRIAMTERENE-HCTZ) Take 1 tablet by mouth once a day  #30 x 5   Entered and Authorized by:   Hartley Barefoot MD   Signed by:   Hartley Barefoot MD on 12/03/2009   Method used:   Electronically to        [facility] [phone]* (retail)       [address] [place]  [address]       Ph: [phone] or [phone]       Fax: [phone]   RxID:   [id number] PRAVASTATIN SODIUM 20 MG TABS (PRAVASTATIN SODIUM) Take 1 tablet by mouth daily.  #30 x 6   Entered and Authorized by:   Hartley Barefoot MD   Signed by:   Hartley Barefoot MD on 12/03/2009   Method used:   Electronically to        [facility] [phone]* (retail)       [address] [place]  [address]       Ph: [phone] or [phone]       Fax: [phone]   RxID:   [id number]   Prevention & Chronic Care Immunizations   Influenza vaccine: Not documented   Influenza vaccine deferral: Refused  (03/06/2009)    Tetanus booster: 03/06/2009: Td    Pneumococcal vaccine: Not documented   Pneumococcal vaccine deferral: Refused  (03/06/2009)  Colorectal Screening   Hemoccult: Not documented   Hemoccult action/deferral: Ordered  (03/06/2009)    Colonoscopy: Not documented   Colonoscopy action/deferral: Deferred  (12/03/2009)  Other Screening   Pap  smear: Normal  (11/19/2006)   Pap smear due: 03/06/2010    Mammogram: Assessment: BIRADS 1.   (10/13/2007)   Mammogram action/deferral: Ordered  (12/03/2009)   Mammogram due: 10/2007   Smoking status: current  (12/03/2009)   Smoking cessation counseling: yes  (12/03/2009)  Lipids   Total Cholesterol: 152  (07/19/2009)   LDL: 64  (07/19/2009)   LDL Direct: Not documented   HDL: 72  (07/19/2009)   Triglycerides: 81  (07/19/2009)   Lipid panel due: 11/01/2009    SGOT (AST): 23  (07/19/2009)   SGPT (ALT): 20  (  07/19/2009) CMP ordered    Alkaline phosphatase: 65  (07/19/2009)   Total bilirubin: 0.4  (07/19/2009)  Hypertension   Last Blood Pressure: 132 / 73  (12/03/2009)   Serum creatinine: 0.64  (07/19/2009)   Serum potassium 3.8  (07/19/2009) CMP ordered    Basic metabolic panel due: 03/06/2010    Hypertension flowsheet reviewed?: Yes   Progress toward BP goal: Improved  Self-Management Support :   Personal Goals (by the next clinic visit) :      Personal blood pressure goal: 140/90  (03/06/2009)   Patient will work on the following items until the next clinic visit to reach self-care goals:     Medications and monitoring: take my medicines every day, bring all of my medications to every visit  (12/03/2009)     Eating: drink diet soda or water instead of juice or soda, eat more vegetables, use fresh or frozen vegetables, eat foods that are low in salt, eat baked foods instead of fried foods, eat fruit for snacks and desserts  (12/03/2009)     Activity: take a 30 minute walk every day  (12/03/2009)    Hypertension self-management support: Education handout, Resources for patients handout, Written self-care plan  (12/03/2009)   Hypertension self-care plan printed.   Hypertension education handout printed    Lipid self-management support: Pre-printed educational material, Resources for patients handout  (12/03/2009)        Resource handout printed.   Nursing  Instructions: Schedule screening mammogram (see order)    Process Orders Check Orders Results:     [facility]: ABN not required for this insurance Tests Sent for requisitioning (December 03, 2009 4:18 PM):     12/03/2009: [facility] -- T-Comprehensive Metabolic Panel [phone] (signed)

## 2010-10-19 ENCOUNTER — Other Ambulatory Visit: Payer: Self-pay | Admitting: *Deleted

## 2010-11-07 ENCOUNTER — Telehealth: Payer: Self-pay | Admitting: *Deleted

## 2010-11-07 NOTE — Telephone Encounter (Signed)
Pt called to say she had "a bad cold", was getting better but stayed out of work today and just felt she probably needed to take something otc, she ask for suggestions and is referred to her pharmacy to speak w/ the pharmacist and ask for a review of her prescribed meds and what would be the best otc to use, she is agreeable w/ this and is cautioned if she becomes worse or feels the need to be seen that she may call back to clinic or visit the urgent care, she is agreeable

## 2010-11-21 ENCOUNTER — Encounter: Payer: Self-pay | Admitting: Internal Medicine

## 2010-12-16 ENCOUNTER — Other Ambulatory Visit: Payer: Self-pay | Admitting: *Deleted

## 2010-12-17 ENCOUNTER — Other Ambulatory Visit: Payer: Self-pay | Admitting: *Deleted

## 2010-12-17 MED ORDER — TRIAMTERENE-HCTZ 37.5-25 MG PO CAPS: 1.0000 | ORAL_CAPSULE | Freq: Every day | ORAL | Status: DC

## 2010-12-17 NOTE — Telephone Encounter (Signed)
Last seen 10/11 and BP good. Has May appt. Will refill

## 2010-12-31 ENCOUNTER — Encounter: Payer: Self-pay | Admitting: Internal Medicine

## 2011-01-06 ENCOUNTER — Other Ambulatory Visit: Payer: Self-pay | Admitting: *Deleted

## 2011-01-06 MED ORDER — NAPROXEN 500 MG PO TABS: 500.0000 mg | ORAL_TABLET | Freq: Two times a day (BID) | ORAL | Status: DC

## 2011-02-04 ENCOUNTER — Encounter: Payer: Self-pay | Admitting: Internal Medicine

## 2011-03-04 ENCOUNTER — Other Ambulatory Visit: Payer: Self-pay | Admitting: *Deleted

## 2011-03-04 MED ORDER — NAPROXEN 500 MG PO TABS: 500.0000 mg | ORAL_TABLET | Freq: Two times a day (BID) | ORAL | Status: DC

## 2011-03-04 NOTE — Telephone Encounter (Signed)
Faxed to the [facility]. 

## 2011-03-24 ENCOUNTER — Encounter: Payer: Self-pay | Admitting: Internal Medicine

## 2011-03-24 ENCOUNTER — Other Ambulatory Visit: Payer: Self-pay | Admitting: Internal Medicine

## 2011-03-24 ENCOUNTER — Ambulatory Visit (INDEPENDENT_AMBULATORY_CARE_PROVIDER_SITE_OTHER): Payer: Self-pay | Admitting: Internal Medicine

## 2011-03-24 VITALS — BP 160/84 | HR 90 | Temp 97.1°F | Ht 68.0 in | Wt 201.5 lb

## 2011-03-24 DIAGNOSIS — M1990 Unspecified osteoarthritis, unspecified site: Secondary | ICD-10-CM

## 2011-03-24 DIAGNOSIS — I10 Essential (primary) hypertension: Secondary | ICD-10-CM

## 2011-03-24 DIAGNOSIS — K3189 Other diseases of stomach and duodenum: Secondary | ICD-10-CM

## 2011-03-24 DIAGNOSIS — Z1239 Encounter for other screening for malignant neoplasm of breast: Secondary | ICD-10-CM

## 2011-03-24 DIAGNOSIS — E785 Hyperlipidemia, unspecified: Secondary | ICD-10-CM

## 2011-03-24 DIAGNOSIS — R1013 Epigastric pain: Secondary | ICD-10-CM

## 2011-03-24 DIAGNOSIS — Z1231 Encounter for screening mammogram for malignant neoplasm of breast: Secondary | ICD-10-CM

## 2011-03-24 DIAGNOSIS — Z1211 Encounter for screening for malignant neoplasm of colon: Secondary | ICD-10-CM

## 2011-03-24 DIAGNOSIS — B977 Papillomavirus as the cause of diseases classified elsewhere: Secondary | ICD-10-CM

## 2011-03-24 MED ORDER — AMLODIPINE BESYLATE 2.5 MG PO TABS: 2.5000 mg | ORAL_TABLET | Freq: Every day | ORAL | Status: DC

## 2011-03-24 MED ORDER — OMEPRAZOLE 20 MG PO CPDR: 20.0000 mg | DELAYED_RELEASE_CAPSULE | Freq: Every day | ORAL | Status: DC

## 2011-03-24 NOTE — Progress Notes (Signed)
  Subjective:    Patient ID: Cassandra Park, female    DOB: 28-May-1961, 50 y.o.   MRN: [id number]  HPI Patient is 50 yo female with PMH of hypertension, hyperlipidemia, osteoarthtitis, keratoconus and human papillomavirus return for a follow-up visit. Patient reports that she has been taking all her medications regularly. She has mild indigestion problem and denies heart burn and diarrhea. She has not noticed any blood stool in her stool.   Review of Systems  Constitutional: Negative.   HENT: Negative.   Eyes: Positive for visual disturbance.       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  Respiratory: Negative.   Cardiovascular: Negative.   Gastrointestinal: Negative for nausea, vomiting, abdominal pain, diarrhea, constipation, blood in stool, abdominal distention, anal bleeding and rectal pain.       Mild indigestion.  Musculoskeletal: Positive for arthralgias. Negative for myalgias, back pain, joint swelling and gait problem.  Skin: Negative.   Neurological: Negative.   Psychiatric/Behavioral: Negative.        Objective:   Physical Exam    General: alert, well-developed, and cooperative to examination.  Eyes: lost vision in left eye. Right eye has keratoconus. EOMI.  Mouth: pharynx pink and moist, no erythema, and no exudates.  Neck: supple, full ROM, no thyromegaly, no JVD, and no carotid bruits.  Lungs: normal respiratory effort, no accessory muscle use, normal breath sounds, no crackles, and no wheezes. Heart: normal rate, regular rhythm, no murmur, no gallop, and no rub.  Abdomen: soft, non-tender, normal bowel sounds, no distention, no guarding, no rebound tenderness, no hepatomegaly, and no splenomegaly.  Msk: no joint swelling, no joint warmth, and no redness over joints.  Pulses: 2+ DP/PT pulses bilaterally Extremities: No cyanosis, clubbing, edema Neurologic: alert & oriented X3, cranial nerves II-XII intact, strength normal in all extremities, sensation  intact to light touch, and gait normal.  Skin: turgor normal and no rashes.       Assessment & Plan:

## 2011-03-24 NOTE — Patient Instructions (Signed)
Please return for fasting blood work tomorrow morning. Please start Amlodipine 2.5 mg one tablet daily for high blood pressure. Please start Omeprazole (Prilosec) 20 mg one capsule daily for indigestion. Take the naproxen only when you have pain, not on a regular basis.

## 2011-03-24 NOTE — Assessment & Plan Note (Addendum)
Patient reports mild indigestion symptoms. This may be related to the Naproxen usage. Since patient does not have constant pain from her osteoarthritis, she is instructed to take Naproxen only when she has pain.  Prilosec is added to her treatment regimen.

## 2011-03-24 NOTE — Assessment & Plan Note (Signed)
Patient's recent Pap smear done on Oct 28, 2010 was normal. She does not have any new symptoms. No further treatment, will follow up.

## 2011-03-24 NOTE — Assessment & Plan Note (Signed)
Patient has been taking Pravachol regularly. Her last lipid panel done March 22, 2011 showed that LDL and HDL was 70 and 75, respectively. Patient will continue the treatment with same medication. Fasting lipid panel and CMBT are ordered.

## 2011-03-24 NOTE — Assessment & Plan Note (Signed)
Patient is currently taking Dyazide (one 37.5/25 mg capsule daily). She does not check her blood pressure at home regularly. Her Bp is 160/84 mmHg at clinic. Amlodipine (2.5 mg daily) is added to her treatment regimen. CMBT was ordered.

## 2011-03-24 NOTE — Assessment & Plan Note (Addendum)
Patient does not have constant pain. She is instructed to take Naproxen only when she has pain.

## 2011-03-25 ENCOUNTER — Other Ambulatory Visit: Payer: Self-pay

## 2011-03-25 DIAGNOSIS — M1990 Unspecified osteoarthritis, unspecified site: Secondary | ICD-10-CM

## 2011-03-25 DIAGNOSIS — E785 Hyperlipidemia, unspecified: Secondary | ICD-10-CM

## 2011-03-25 LAB — CBC WITH DIFFERENTIAL/PLATELET
Basophils Relative: 0 % (ref 0–1)
Eosinophils Absolute: 0.2 10*3/uL (ref 0.0–0.7)
Eosinophils Relative: 2 % (ref 0–5)
Hemoglobin: 14.5 g/dL (ref 12.0–15.0)
MCH: 31.5 pg (ref 26.0–34.0)
MCHC: 32.6 g/dL (ref 30.0–36.0)
Monocytes Absolute: 0.5 10*3/uL (ref 0.1–1.0)
Monocytes Relative: 6 % (ref 3–12)
Neutrophils Relative %: 56 % (ref 43–77)

## 2011-03-25 LAB — LIPID PANEL
Cholesterol: 187 mg/dL (ref 0–200)
LDL Cholesterol: 81 mg/dL (ref 0–99)
Triglycerides: 84 mg/dL (ref <150)
VLDL: 17 mg/dL (ref 0–40)

## 2011-03-25 NOTE — Progress Notes (Signed)
I saw patient and discussed her care with resident Dr. Clyde Lundborg.  I agree with the clinical findings and plans as outlined in his note.

## 2011-03-26 LAB — COMPLETE METABOLIC PANEL WITH GFR
ALT: 14 U/L (ref 0–35)
Albumin: 4.6 g/dL (ref 3.5–5.2)
Alkaline Phosphatase: 62 U/L (ref 39–117)
BUN: 10 mg/dL (ref 6–23)
Calcium: 9.6 mg/dL (ref 8.4–10.5)
Chloride: 103 mEq/L (ref 96–112)
Creat: 0.71 mg/dL (ref 0.50–1.10)
Total Bilirubin: 0.5 mg/dL (ref 0.3–1.2)

## 2011-04-03 ENCOUNTER — Ambulatory Visit (HOSPITAL_COMMUNITY)
Admission: RE | Admit: 2011-04-03 | Discharge: 2011-04-03 | Disposition: A | Discharge status: Home / Self Care | Payer: Self-pay | Source: Ambulatory Visit | Attending: Internal Medicine | Admitting: Internal Medicine

## 2011-04-03 DIAGNOSIS — Z1231 Encounter for screening mammogram for malignant neoplasm of breast: Secondary | ICD-10-CM | POA: Insufficient documentation

## 2011-04-09 ENCOUNTER — Other Ambulatory Visit (INDEPENDENT_AMBULATORY_CARE_PROVIDER_SITE_OTHER): Payer: Self-pay

## 2011-04-09 DIAGNOSIS — Z1211 Encounter for screening for malignant neoplasm of colon: Secondary | ICD-10-CM

## 2011-04-09 LAB — POC HEMOCCULT BLD/STL (HOME/3-CARD/SCREEN): Card #3 Fecal Occult Blood, POC: NEGATIVE

## 2011-04-29 ENCOUNTER — Ambulatory Visit (INDEPENDENT_AMBULATORY_CARE_PROVIDER_SITE_OTHER): Payer: Self-pay | Admitting: Internal Medicine

## 2011-04-29 ENCOUNTER — Encounter: Payer: Self-pay | Admitting: Internal Medicine

## 2011-04-29 VITALS — BP 125/78 | HR 84

## 2011-04-29 DIAGNOSIS — Z87891 Personal history of nicotine dependence: Secondary | ICD-10-CM

## 2011-04-29 DIAGNOSIS — M1990 Unspecified osteoarthritis, unspecified site: Secondary | ICD-10-CM

## 2011-04-29 DIAGNOSIS — R1013 Epigastric pain: Secondary | ICD-10-CM

## 2011-04-29 DIAGNOSIS — K3189 Other diseases of stomach and duodenum: Secondary | ICD-10-CM

## 2011-04-29 DIAGNOSIS — E785 Hyperlipidemia, unspecified: Secondary | ICD-10-CM

## 2011-04-29 DIAGNOSIS — I10 Essential (primary) hypertension: Secondary | ICD-10-CM

## 2011-04-29 NOTE — Assessment & Plan Note (Signed)
After stopped Naproxen due to no joint pain and started Prilosec, patient's dyspepsia is getting much better. She only has very mild heart burn which happens because she did not get her Prilosec refill. She was instructed to continue her Prilosec today.

## 2011-04-29 NOTE — Assessment & Plan Note (Signed)
After amlodipine was added to her regimen, her Bp is better controlled. Today her Bp is 125/78 mmHg. Her recent BMP was normal except for glucose (102). Will continue current regimen.

## 2011-04-29 NOTE — Assessment & Plan Note (Signed)
Patient's hyperlipidemia is well controlled with Pravachol. The result of her recent lipid panel was normal. She has not noticed any side effects from this medication such as muscle pain. Her AST/ALT was normal. Will continue her current regimen.

## 2011-04-29 NOTE — Assessment & Plan Note (Signed)
Patient is still not ready to quit her smoking. She was encouraged to cut down on her cigarettes.

## 2011-04-29 NOTE — Patient Instructions (Signed)
1. All your lab tests came back normal. You have done excellent in taking your medications regularly. I appreciate that very much. Please keep doing that.  2. Please let me know when you are ready to quit smoking.  3. Please come back for a follow-up visit in 6 months.

## 2011-04-29 NOTE — Progress Notes (Signed)
  Subjective:    Patient ID: Cassandra Park, female    DOB: 07-Mar-1961, 50 y.o.   MRN: [id number]  HPI Patient is 50 yo female with PMH of hypertension, hyperlipidemia, osteoarthritis, keratoconus and dyspepsia, who presents for a follow-up visit. Patient was last seen on March 24, 2011, when several tests were ordered. All tests including BMP, lipid panel, CBC and FOBT came back normal except for glucose (102). Patient reports that she has been taking all her medications regularly. Today patient feels good and does not have any new complaints. She did not notice any side effects from her current medications   Review of Systems Constitutional: Negative.  HENT: Negative.  Eyes: Positive for visual disturbance.  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  Respiratory: Negative.  Cardiovascular: Negative.  Gastrointestinal: Negative for nausea, vomiting, abdominal pain, diarrhea, constipation, blood in stool, abdominal distention, anal bleeding and rectal pain.  Mild heart burn.  Musculoskeletal: Negative for myalgias, back pain, joint swelling and gait problem.  Skin: Negative.  Neurological: Negative.  Psychiatric/Behavioral: Negative.     Objective:   Physical Exam General: alert, well-developed, and cooperative to examination.  Eyes: lost vision in left eye. Right eye has keratoconus. EOMI.  Mouth: pharynx pink and moist, no erythema, and no exudates.  Neck: supple, full ROM, no thyromegaly, no JVD, and no carotid bruits.  Lungs: normal respiratory effort, no accessory muscle use, normal breath sounds, no crackles, and no wheezes. Heart: normal rate, regular rhythm, no murmur, no gallop, and no rub.  Abdomen: soft, non-tender, normal bowel sounds, no distention, no guarding, no rebound tenderness, no hepatomegaly, and no splenomegaly.  Msk: no joint swelling, no joint warmth, and no redness over joints.  Pulses: 2+ DP/PT pulses bilaterally Extremities: No cyanosis, clubbing,  edema      Assessment & Plan:

## 2011-04-29 NOTE — Assessment & Plan Note (Signed)
Patient has not had pain in the past month. She is instructed to take Naproxen only when she has pain.

## 2011-05-12 NOTE — Progress Notes (Signed)
Ms. Priore' history and physical examination were reviewed with Dr. Clyde Lundborg and the assessment and plan were formulated together.  I agree with the documentation.

## 2011-06-10 ENCOUNTER — Other Ambulatory Visit: Payer: Self-pay | Admitting: Internal Medicine

## 2011-06-12 ENCOUNTER — Other Ambulatory Visit: Payer: Self-pay | Admitting: Internal Medicine

## 2011-07-22 ENCOUNTER — Other Ambulatory Visit: Payer: Self-pay | Admitting: *Deleted

## 2011-07-22 DIAGNOSIS — I10 Essential (primary) hypertension: Secondary | ICD-10-CM

## 2011-07-22 MED ORDER — AMLODIPINE BESYLATE 2.5 MG PO TABS: 2.5000 mg | ORAL_TABLET | Freq: Every day | ORAL | Status: DC

## 2011-07-23 NOTE — Telephone Encounter (Signed)
Called to [facility]

## 2011-10-23 ENCOUNTER — Other Ambulatory Visit: Payer: Self-pay | Admitting: Internal Medicine

## 2011-11-14 ENCOUNTER — Other Ambulatory Visit: Payer: Self-pay | Admitting: *Deleted

## 2011-11-14 DIAGNOSIS — I10 Essential (primary) hypertension: Secondary | ICD-10-CM

## 2011-11-17 MED ORDER — AMLODIPINE BESYLATE 2.5 MG PO TABS: 2.5000 mg | ORAL_TABLET | Freq: Every day | ORAL | Status: DC

## 2011-11-17 NOTE — Telephone Encounter (Signed)
Pt needs an appt. Was requested Feb F/U.

## 2011-11-17 NOTE — Telephone Encounter (Signed)
Message sent to front office to schedule appointment. Rx faxed in

## 2011-12-04 ENCOUNTER — Encounter: Payer: Self-pay | Admitting: Internal Medicine

## 2012-01-27 ENCOUNTER — Encounter: Payer: Self-pay | Admitting: Internal Medicine

## 2012-01-27 ENCOUNTER — Ambulatory Visit (INDEPENDENT_AMBULATORY_CARE_PROVIDER_SITE_OTHER): Payer: Self-pay | Admitting: Internal Medicine

## 2012-01-27 VITALS — BP 147/89 | HR 90 | Temp 97.0°F | Ht 68.5 in | Wt 191.8 lb

## 2012-01-27 DIAGNOSIS — Z0000 Encounter for general adult medical examination without abnormal findings: Secondary | ICD-10-CM | POA: Insufficient documentation

## 2012-01-27 DIAGNOSIS — Z1211 Encounter for screening for malignant neoplasm of colon: Secondary | ICD-10-CM

## 2012-01-27 DIAGNOSIS — I10 Essential (primary) hypertension: Secondary | ICD-10-CM

## 2012-01-27 DIAGNOSIS — Z1239 Encounter for other screening for malignant neoplasm of breast: Secondary | ICD-10-CM

## 2012-01-27 DIAGNOSIS — E785 Hyperlipidemia, unspecified: Secondary | ICD-10-CM

## 2012-01-27 DIAGNOSIS — J309 Allergic rhinitis, unspecified: Secondary | ICD-10-CM | POA: Insufficient documentation

## 2012-01-27 DIAGNOSIS — Z1231 Encounter for screening mammogram for malignant neoplasm of breast: Secondary | ICD-10-CM

## 2012-01-27 DIAGNOSIS — N951 Menopausal and female climacteric states: Secondary | ICD-10-CM

## 2012-01-27 LAB — COMPREHENSIVE METABOLIC PANEL
Albumin: 4.4 g/dL (ref 3.5–5.2)
Alkaline Phosphatase: 77 U/L (ref 39–117)
BUN: 10 mg/dL (ref 6–23)
Calcium: 9.8 mg/dL (ref 8.4–10.5)
Chloride: 103 mEq/L (ref 96–112)
Creat: 0.74 mg/dL (ref 0.50–1.10)
Glucose, Bld: 96 mg/dL (ref 70–99)
Potassium: 3.6 mEq/L (ref 3.5–5.3)
Sodium: 142 mEq/L (ref 135–145)
Total Bilirubin: 0.4 mg/dL (ref 0.3–1.2)

## 2012-01-27 LAB — LIPID PANEL
Cholesterol: 193 mg/dL (ref 0–200)
HDL: 63 mg/dL (ref >39)
Total CHOL/HDL Ratio: 3.1 Ratio
Triglycerides: 190 mg/dL — ABNORMAL HIGH (ref <150)
VLDL: 38 mg/dL (ref 0–40)

## 2012-01-27 MED ORDER — LORATADINE 10 MG PO TABS: 10.0000 mg | ORAL_TABLET | Freq: Every day | ORAL | Status: DC

## 2012-01-27 MED ORDER — AMLODIPINE BESYLATE 2.5 MG PO TABS: 2.5000 mg | ORAL_TABLET | Freq: Every day | ORAL | Status: DC

## 2012-01-27 MED ORDER — ASPIRIN EC 81 MG PO TBEC: 81.0000 mg | DELAYED_RELEASE_TABLET | Freq: Every day | ORAL | Status: AC

## 2012-01-27 NOTE — Assessment & Plan Note (Signed)
Blood pressure slightly above goal today but within normal limits. Will check metabolic panel to assess left assessment renal function. Have patient followup in one to 3 months repeat blood pressure check.  Will not make any changes to her blood pressure regimen at this time based on a single reading; would consider increasing Norvasc if blood pressure remains elevated.

## 2012-01-27 NOTE — Progress Notes (Signed)
Loratadine, ASA, and Amlodipine rxs called to [facility] pharmacy on [address] per pt's request.

## 2012-01-27 NOTE — Progress Notes (Signed)
Patient ID: Cassandra Park, female   DOB: Jan 16, 1961, 51 y.o.   MRN: [id number]  Subjective:     HPI: pt is a 51 y/o F here today for annual follow up.   Menopause: pt has started to have hot flashes.  She notes her hot flashes last for a few minutes at a time and do not interfere with her ability to work.  She has no other concerns or complaints today.  Review of Systems Constitutional: Negative for fever, chills, diaphoresis, activity change, appetite change, fatigue and unexpected weight change.  HENT: Negative for hearing loss, congestion and neck stiffness.   Eyes: Negative for photophobia, pain and visual disturbance.  Respiratory: Negative for cough, chest tightness, shortness of breath and wheezing.   Cardiovascular: Negative for chest pain and palpitations.  Gastrointestinal: Negative for abdominal pain, blood in stool and anal bleeding.  Genitourinary: Negative for dysuria, hematuria and difficulty urinating.  Musculoskeletal: Negative for joint swelling.  Neurological: Negative for dizziness, syncope, speech difficulty, weakness, numbness and headaches. ]     Objective:   Physical Exam VItal signs reviewed and stable. GEN: No apparent distress.  Alert and oriented x 3.  Pleasant, conversant, and cooperative to exam. HEENT: head is autraumatic and normocephalic.  Neck is supple without palpable masses or lymphadenopathy.  No JVD or carotid bruits.  Vision intact.  EOMI.  PERRLA.  Sclerae anicteric.  Conjunctivae without pallor or injection. Mucous membranes are moist.  Oropharynx is without erythema, exudates, or other abnormal lesions.  Small amounts of mucoid drainage noted in posterior oropharynx. Nasal mucosa appears pale and boggy. RESP:  Lungs are clear to ascultation bilaterally with good air movement.  No wheezes, ronchi, or rubs. CARDIOVASCULAR: regular rate, normal rhythm.  Clear S1, S2, no murmurs, gallops, or rubs. ABDOMEN: soft, non-tender, non-distended.  Bowels  sounds present in all quadrants and normoactive.  No palpable masses. EXT: warm and dry.  Peripheral pulses equal, intact, and +2 globally.  No clubbing or cyanosis.  No edema in bilateral lower extremities. SKIN: warm and dry with normal turgor.  No rashes or abnormal lesions observed. NEURO: CN II-XII grossly intact.  Muscle strength +5/5 in bilateral upper and lower extremities.  Sensation is grossly intact.  No focal deficit.     Assessment/Plan:

## 2012-01-27 NOTE — Assessment & Plan Note (Signed)
Pt dues for screening mammogram. WIll refer today.  Denies any abnormal breast lesions or nipple discharge.

## 2012-01-27 NOTE — Assessment & Plan Note (Signed)
Patient is tolerating her statin well. We'll repeat fasting lipid profile today. When last checked a year ago her LDL was at goal.

## 2012-01-27 NOTE — Assessment & Plan Note (Signed)
Patient reports increased sinus congestion with postnasal drip, and dry cough. She is afebrile and without history of productive cough or other symptoms concerning for pneumonia/upper or strain tension. Will prescribe her loratadine for control of her allergic rhinitis. She is advised to return to clinic or go to the emergency room should she develop worsening shortness of breath, fever, shaking chills, chest pain, or other concerning symptoms

## 2012-01-27 NOTE — Assessment & Plan Note (Signed)
Will refer patient for screening colonoscopy and screening mammogram today. Have asked for followup with me in one to 2 weeks for routine pelvic exam and Pap smear.

## 2012-01-27 NOTE — Assessment & Plan Note (Signed)
Patient is agreeable to screening colonoscopy.  She denies any family history of colon cancer. She denies loss of weight, para blood per rectum, dark tarry stools, or change in bowel habits. Will refer her to GI today

## 2012-01-27 NOTE — Patient Instructions (Addendum)
Schedule a follow up appointment with Dr. Arvilla Market in 1-2 weeks for your annual pelvic exam and pap smear. Schedule followup appointment in 3 months with her primary care provider to check your blood pressure. We will schedule you for a mammogram to screen for breast cancer. Will refer you to a gastroenterologist for a screening colonoscopy to detect early colon cancer. I will call you if any of your blood work is abnormal. Loratadine is a medicine to help with allergies. Take one pill daily as directed.  If you develop a temperature greater than 101, shaking chills, worsening shortness of breath, worsening cough, coughing up blood, passing out, or other concerning symptoms come back to the clinic or go immediately to the emergency room. Keep taking your medications as directed. Call the clinic at [phone] with any concerns or questions.

## 2012-01-27 NOTE — Assessment & Plan Note (Signed)
Discussed various medications used for treatment of hot flashes. Patient states she does not wish to begin a medication for treatment of her hot flashes it is not adversely impact her life at this point. Advised her to return to clinic to discuss starting an SSRI if her hot flashes worsen, or she develops other concerning symptoms of menopause.

## 2012-01-28 NOTE — Progress Notes (Signed)
Rx already at [facility].

## 2012-01-28 NOTE — Progress Notes (Signed)
Rx was at [facility].

## 2012-02-12 ENCOUNTER — Encounter: Payer: Self-pay | Admitting: Internal Medicine

## 2012-02-24 ENCOUNTER — Encounter: Payer: Self-pay | Admitting: Internal Medicine

## 2012-03-02 ENCOUNTER — Encounter: Payer: Self-pay | Admitting: Internal Medicine

## 2012-03-02 NOTE — Addendum Note (Signed)
Addended by: Neomia Dear on: 03/02/2012 06:19 PM   Modules accepted: Orders

## 2012-03-15 ENCOUNTER — Other Ambulatory Visit (HOSPITAL_COMMUNITY)
Admission: RE | Admit: 2012-03-15 | Discharge: 2012-03-15 | Disposition: A | Discharge status: Home / Self Care | Payer: Self-pay | Source: Ambulatory Visit | Attending: Internal Medicine | Admitting: Internal Medicine

## 2012-03-15 ENCOUNTER — Ambulatory Visit (INDEPENDENT_AMBULATORY_CARE_PROVIDER_SITE_OTHER): Payer: Self-pay | Admitting: Internal Medicine

## 2012-03-15 ENCOUNTER — Encounter: Payer: Self-pay | Admitting: Internal Medicine

## 2012-03-15 VITALS — BP 149/91 | HR 81 | Temp 97.8°F | Ht 69.0 in | Wt 194.8 lb

## 2012-03-15 DIAGNOSIS — J309 Allergic rhinitis, unspecified: Secondary | ICD-10-CM

## 2012-03-15 DIAGNOSIS — M1990 Unspecified osteoarthritis, unspecified site: Secondary | ICD-10-CM

## 2012-03-15 DIAGNOSIS — Z789 Other specified health status: Secondary | ICD-10-CM

## 2012-03-15 DIAGNOSIS — Z01419 Encounter for gynecological examination (general) (routine) without abnormal findings: Secondary | ICD-10-CM | POA: Insufficient documentation

## 2012-03-15 DIAGNOSIS — I10 Essential (primary) hypertension: Secondary | ICD-10-CM

## 2012-03-15 DIAGNOSIS — E785 Hyperlipidemia, unspecified: Secondary | ICD-10-CM

## 2012-03-15 MED ORDER — AMLODIPINE BESYLATE 2.5 MG PO TABS: 5.0000 mg | ORAL_TABLET | Freq: Every day | ORAL | Status: DC

## 2012-03-15 MED ORDER — TRIAMTERENE-HCTZ 37.5-25 MG PO TABS: 1.0000 | ORAL_TABLET | Freq: Every day | ORAL | Status: DC

## 2012-03-15 NOTE — Patient Instructions (Signed)
1. your blood pressure is still slightly elevated. I increased  amlodipine dose from a 2.5 mg to  5.0 mg. Please take 5.0 mg of amlodipine from now.  2. Please take all medications as prescribed.  3. If you have worsening of your symptoms or new symptoms arise, please call the clinic ([phone]), or go to the ER immediately if symptoms are severe.  You have done great job in taking all your medications. I appreciate it very much. Please continue doing that.

## 2012-03-15 NOTE — Progress Notes (Signed)
Patient ID: Cassandra Park, female   DOB: 03-27-1961, 51 y.o.   MRN: [id number]   Subjective:   Patient ID: Cassandra Park female   DOB: April 22, 1961 51 y.o.   MRN: [id number]  HPI: [name] is a 51 y.o. past medical history as outlined below, who presents for a regular followup visit.  Patient's reports that she has been taking all her medications regularly. She does she doesn't have any new complaints. She stopped her ranitidine because of no symptoms of allergy. She also stopped her naproxen because her arthritis does not bother her anymore. She also stopped her Prilosec because she doesn't have acid reflex problem any more.  Patient reports that she is scheduled for mammogram on July 16. She will also call for colonoscopy at June 15   Past Medical History  Diagnosis Date  . Vaginal bleeding, abnormal June 2010    Thought to be 2/2 DUB vs endometrial CA. Gyn referral made at that time.  . Allergic rhinitis   . HTN (hypertension)   . Hyperlipidemia   . Osteoarthritis     Left hip  . Substance abuse     tobacco, alcohol  . Keratoconus     f/u @ [facility] eye center. Not needing corneal transplant.   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 2 pap smears since then normal. [facility]   Current Outpatient Prescriptions  Medication Sig Dispense Refill  . amLODipine (NORVASC) 2.5 MG tablet Take 2 tablets (5 mg total) by mouth daily.  30 tablet  2  . aspirin EC 81 MG tablet Take 1 tablet (81 mg total) by mouth daily.  30 tablet  1  . calcium-vitamin D (OSCAL WITH D 500-200) 500-200 MG-UNIT per tablet Take 1 tablet by mouth 2 (two) times daily.        . pravastatin (PRAVACHOL) 20 MG tablet TAKE ONE TABLET BY MOUTH IN THE EVENING  30 tablet  6  . DISCONTD: amLODipine (NORVASC) 2.5 MG tablet Take 1 tablet (2.5 mg total) by mouth daily.  30 tablet  2  . triamterene-hydrochlorothiazide (MAXZIDE-25) 37.5-25 MG per tablet Take 1 each (1 tablet total) by  mouth daily.  30 tablet  11   Family History  Problem Relation Age of Onset  . Appendicitis Father     deceased   History   Social History  . Marital Status: Married    Spouse Name: N/A    Number of Children: N/A  . Years of Education: N/A   Social History Main Topics  . Smoking status: Current Everyday Smoker -- 0.5 packs/day    Types: Cigarettes  . Smokeless tobacco: Current User  . Alcohol Use: Yes     occasionally  . Drug Use: No  . Sexually Active: None   Other Topics Concern  . 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  HEENT: Denies photophobia, eye pain, redness, hearing loss, ear pain, congestion, sore throat, rhinorrhea, sneezing, mouth sores, trouble swallowing, neck pain, neck stiffness and tinnitus.   Respiratory: Denies SOB, DOE, cough, chest tightness,  and wheezing.   Cardiovascular: Denies chest pain, palpitations and leg swelling.  Gastrointestinal: Denies nausea, vomiting,  abdominal pain, diarrhea, constipation, blood in stool and abdominal distention.  Genitourinary: Denies dysuria, urgency, frequency, hematuria, flank pain and difficulty urinating.  Musculoskeletal: Denies myalgias, back pain, joint swelling, arthralgias and gait problem.  Skin: Denies pallor, rash and wound.  Neurological: Denies dizziness, seizures, syncope, weakness, light-headedness, numbness and headaches.  Hematological: Denies adenopathy. Easy bruising, personal or family bleeding history  Psychiatric/Behavioral: Denies suicidal ideation, mood changes, confusion, nervousness, sleep disturbance and agitation  Objective:  Physical Exam: Filed Vitals:   03/15/12 0823  BP: 149/91  Pulse: 81  Temp: 97.8 F  (36.6 C)  TempSrc: Oral  Height: 5\' 9"  (1.753 m)  Weight: 194 lb 12.8 oz (88.361 kg)    General: resting in bed, not in acute distress HEENT: PERRL, EOMI, no scleral icterus Cardiac: S1/S2, RRR, No murmurs, gallops or rubs Pulm: Good air movement bilaterally, Clear to auscultation bilaterally, No rales, wheezing, rhonchi or rubs. Abd: Soft,  nondistended, nontender, no rebound pain, no organomegaly, BS present Ext: No rashes or edema, 2+DP/PT pulse bilaterally Musculoskeletal: No joint deformities, erythema, or stiffness, ROM full and nontender Skin: no rashes. No skin bruise. Neuro: alert and oriented X3, cranial nerves II-XII grossly intact, muscle strength 5/5 in all extremeties,  sensation to light touch intact.  Psych.: patient is not psychotic, no suicidal or hemocidal ideation.    Assessment & Plan:   #: Hypertension: Her blood pressure is above the goal. Today her blood pressure is 149/91. Will increase amlodipine dose from 2.5 to 5 mg daily.  # Hyperlipidemia: Her LDL was 92 on 01/27/12. She is currently taking pravastatin. She doesn't have any side effects from pravastatin, such as muscle pain. AST/ALT were normal on 5/09/19/11.  Will continue her current regimen.  # Osteoarthritis: Patient doesn't have any joint pain. She stopped taking naproxen. Will followup..  # Allergic rhinitis: This issue resolved. Patient doesn't take any medications currently. Will followup. Marland Kitchen Healthy maintenance: Will get a Pap smear. Patient is scheduled for mammogram on 7/16. She is going to call GI for colonoscopy appointment on 7/15.  Lorretta Harp, MD PGY1, Internal Medicine Teaching Service Pager: [phone];

## 2012-03-19 ENCOUNTER — Other Ambulatory Visit: Payer: Self-pay | Admitting: Internal Medicine

## 2012-03-31 ENCOUNTER — Telehealth: Payer: Self-pay | Admitting: *Deleted

## 2012-03-31 NOTE — Telephone Encounter (Signed)
Call from pt.  Stated Amlodipine too expensive at [facility] - >$100 and cannot afford.  She called [facility] and they do not have it.  Can you change to a different med; either on $4.00 list at [facility] or One that is available at the [facility]?    Thanks

## 2012-04-01 ENCOUNTER — Other Ambulatory Visit: Payer: Self-pay | Admitting: Internal Medicine

## 2012-04-01 DIAGNOSIS — I10 Essential (primary) hypertension: Secondary | ICD-10-CM

## 2012-04-01 MED ORDER — LISINOPRIL 10 MG PO TABS: 10.0000 mg | ORAL_TABLET | Freq: Every day | ORAL | Status: DC

## 2012-04-01 NOTE — Telephone Encounter (Signed)
Since patient can not afford for amlodipin, will switch amlodipine to lisinopril 10 mg daily. Will check patient BMP in 4 weeks.

## 2012-04-02 NOTE — Telephone Encounter (Signed)
Lisinopril rx faxed to [facility] per pt's request; lab appt was also scheduled for 04/26/12.

## 2012-04-05 ENCOUNTER — Ambulatory Visit (HOSPITAL_COMMUNITY)
Admission: RE | Admit: 2012-04-05 | Discharge: 2012-04-05 | Disposition: A | Discharge status: Home / Self Care | Payer: Self-pay | Source: Ambulatory Visit | Attending: Internal Medicine | Admitting: Internal Medicine

## 2012-04-05 DIAGNOSIS — Z1231 Encounter for screening mammogram for malignant neoplasm of breast: Secondary | ICD-10-CM | POA: Insufficient documentation

## 2012-04-13 ENCOUNTER — Telehealth: Payer: Self-pay | Admitting: Internal Medicine

## 2012-04-13 NOTE — Telephone Encounter (Signed)
I called the patient today and asked her to come to clinic to get a BMP. Patient agreed to come on tomorrow morning. Patient was switched from amlodipine to lisinopril on 04/01/12 (she could not afford for amlodipine). She is also on Maxzide. Her potassium and creatinine levels need to be monitored.   Lorretta Harp, MD PGY2, Internal Medicine Teaching Service Pager: [phone]

## 2012-04-14 ENCOUNTER — Other Ambulatory Visit (INDEPENDENT_AMBULATORY_CARE_PROVIDER_SITE_OTHER): Payer: Self-pay

## 2012-04-14 DIAGNOSIS — I10 Essential (primary) hypertension: Secondary | ICD-10-CM

## 2012-04-14 LAB — BASIC METABOLIC PANEL WITH GFR
Chloride: 106 mEq/L (ref 96–112)
GFR, Est African American: >89 mL/min
GFR, Est Non African American: >89 mL/min
Potassium: 3.6 mEq/L (ref 3.5–5.3)
Sodium: 143 mEq/L (ref 135–145)

## 2012-04-26 ENCOUNTER — Other Ambulatory Visit: Payer: Self-pay

## 2012-04-26 ENCOUNTER — Encounter: Payer: Self-pay | Admitting: *Deleted

## 2012-04-26 NOTE — Progress Notes (Signed)
Pt presented to lab for appt scheduled before her last lab work, i believe this lab today should have been cancelled, please review and i will call pt back if needed, please advise

## 2012-04-26 NOTE — Addendum Note (Signed)
Addended by: Lorretta Harp on: 04/26/2012 02:13 PM   Modules accepted: Orders

## 2012-08-30 ENCOUNTER — Other Ambulatory Visit: Payer: Self-pay | Admitting: Internal Medicine

## 2012-09-30 ENCOUNTER — Other Ambulatory Visit: Payer: Self-pay | Admitting: *Deleted

## 2012-09-30 DIAGNOSIS — I10 Essential (primary) hypertension: Secondary | ICD-10-CM

## 2012-09-30 MED ORDER — LISINOPRIL 10 MG PO TABS: 10.0000 mg | ORAL_TABLET | Freq: Every day | ORAL | Status: DC

## 2012-09-30 NOTE — Telephone Encounter (Signed)
Rx called in to pharmacy. 

## 2012-10-05 ENCOUNTER — Ambulatory Visit (INDEPENDENT_AMBULATORY_CARE_PROVIDER_SITE_OTHER): Payer: No Typology Code available for payment source | Admitting: Internal Medicine

## 2012-10-05 ENCOUNTER — Encounter: Payer: Self-pay | Admitting: Internal Medicine

## 2012-10-05 VITALS — BP 144/85 | HR 85 | Temp 97.0°F | Ht 69.0 in | Wt 189.5 lb

## 2012-10-05 DIAGNOSIS — I10 Essential (primary) hypertension: Secondary | ICD-10-CM

## 2012-10-05 DIAGNOSIS — E785 Hyperlipidemia, unspecified: Secondary | ICD-10-CM

## 2012-10-05 DIAGNOSIS — F17200 Nicotine dependence, unspecified, uncomplicated: Secondary | ICD-10-CM

## 2012-10-05 DIAGNOSIS — Z720 Tobacco use: Secondary | ICD-10-CM

## 2012-10-05 DIAGNOSIS — Z0000 Encounter for general adult medical examination without abnormal findings: Secondary | ICD-10-CM

## 2012-10-05 NOTE — Assessment & Plan Note (Signed)
  Assessment:  Progress toward smoking cessation:   (patient has made decisin to quit smoking on 10/11/12)  Barriers to progress toward smoking cessation:   none  Comments:   Plan:  Instruction/counseling given:  I reviewed strategies for preventing relapses.  Educational resources provided:  Quitline[employer]) brochure  Self management tools provided:  smoking cessation plan (STAR Quit Plan)  Medications to assist with smoking cessation:  Nicotine Patch, but patient does not need it. Patient agreed to the following self-care plans for smoking cessation:  set a quit date and stop smoking   Other: patient made her decision for quitting smoking on 10/11/12. I offered her nicotine patch for assistting her, patient does not need it at this moment.

## 2012-10-05 NOTE — Assessment & Plan Note (Signed)
Patient's LDL was 92 at 01/27/12. She is currently taking pravastatin 20 mg daily. She does not notice any side effects, such as muscle pain. Her liver function was normal at 01/27/12. We'll continue current regimen.

## 2012-10-05 NOTE — Assessment & Plan Note (Signed)
-  Patient refused Pap smear today, will postpone -Patient would like to postpone her colonoscopy to summer when she will have vacation. Will postpone.

## 2012-10-05 NOTE — Progress Notes (Signed)
Patient ID: Cassandra Park, female   DOB: 06/14/1961, 52 y.o.   MRN: [id number] Subjective:   Patient ID: Cassandra Park female   DOB: 07-19-1961 52 y.o.   MRN: [id number]  CC:  6 month follow up visit for HTN.  HPI:  Cassandra Park is a 52 y.o. lady lady with past medical history as outlined below, who presents for followup visit.    Patient's reports that she has been taking all her medications regularly. She does she doesn't have any new complaints.   She reports that she has not taken  her lisinopril since last Thursday (09/30/12) because of running out of her medications. She said that she will pick up her medications today and restart taking lisinopril.  Patient reports that she has made her decision to quit smoking on 10/11/12. When asked whether she needs nicotine patch, she said she would like to try without using any medications first.  Denies fever, chills, fatigue, headaches,  cough, chest pain, SOB,  abdominal pain,diarrhea, constipation, dysuria, urgency, frequency, hematuria, joint pain or leg swelling.   Past Medical History  Diagnosis Date  . Vaginal bleeding, abnormal June 2010    Thought to be 2/2 DUB vs endometrial CA. Gyn referral made at that time.  . Allergic rhinitis   . HTN (hypertension)   . Hyperlipidemia   . Osteoarthritis     Left hip  . Substance abuse     tobacco, alcohol  . Keratoconus     f/u @ [facility] eye center. Not needing corneal transplant.   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 2 pap smears since then normal. [facility]   Current Outpatient Prescriptions  Medication Sig Dispense Refill  . aspirin EC 81 MG tablet Take 1 tablet (81 mg total) by mouth daily.  30 tablet  1  . calcium-vitamin D (OSCAL WITH D 500-200) 500-200 MG-UNIT per tablet Take 1 tablet by mouth 2 (two) times daily.        Marland Kitchen lisinopril (ZESTRIL) 10 MG tablet Take 1 tablet (10 mg total) by mouth daily.  30 tablet  5  . pravastatin  (PRAVACHOL) 20 MG tablet TAKE ONE TABLET BY MOUTH IN THE EVENING  30 tablet  5  . triamterene-hydrochlorothiazide (MAXZIDE-25) 37.5-25 MG per tablet Take 1 each (1 tablet total) by mouth daily.  30 tablet  11   Family History  Problem Relation Age of Onset  . Appendicitis Father     deceased   History   Social History  . Marital Status: Legally Separated    Spouse Name: N/A    Number of Children: N/A  . Years of Education: N/A   Social History Main Topics  . Smoking status: Current Every Day Smoker -- 0.5 packs/day    Types: Cigarettes  . Smokeless tobacco: Current User  . Alcohol Use: Yes     Comment: occasionally  . Drug Use: No  . Sexually Active: None   Other Topics Concern  . 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 at [employer] and has [facility] card, [employer] September 24, 2010 4.31pm    Review of Systems: General: no fevers, chills, no changes in body weight, no changes in appetite Skin: no rash HEENT: no blurry vision, hearing changes or sore throat Pulm: no dyspnea, coughing, wheezing CV: no chest  pain, palpitations, shortness of breath Abd: no nausea/vomiting, abdominal pain, diarrhea/constipation GU: no dysuria, hematuria, polyuria Ext: no arthralgias, myalgias Neuro: no weakness, numbness, or tingling  Objective:  Physical Exam: Filed Vitals:   10/05/12 0854  BP: 144/85  Pulse: 85  Temp: 97 F (36.1 C)  TempSrc: Oral  Height: 5\' 9"  (1.753 m)  Weight: 189 lb 8 oz (85.957 kg)  SpO2: 98%   General: Not in acute distress HEENT: PERRL, EOMI, no scleral icterus, No bruit or JVD Cardiac: S1/S2, RRR, No murmurs, gallops or rubs Pulm: Good air movement bilaterally, Clear to auscultation bilaterally, No rales, wheezing, rhonchi or rubs. Abd: Soft,  nondistended, nontender, no rebound pain, no organomegaly, BS present Ext: No rashes or edema, 2+DP/PT  pulse bilaterally Musculoskeletal: No joint deformities, erythema, or stiffness, ROM full and nontender Skin: no rashes. No skin bruise. Neuro: Alert and oriented X3, cranial nerves II-XII grossly intact, muscle strength 5/5 in all extremeties,  sensation to light touch intact. Brachial reflexes 1+ bilaterally. Knee reflexes 2+ bilaterally. Babinski's sign negative. Psych: patient is not psychotic, no suicidal or hemocidal ideation.   Assessment & Plan:

## 2012-10-05 NOTE — Patient Instructions (Addendum)
1. You have done great job in taking all your medications. I appreciate it very much. Please continue doing that. 2.  Congradulations to you for deciding to quit smoking. Please call me if you need any help.  3. If you have worsening of your symptoms or new symptoms arise, please call the clinic ([phone]), or go to the ER immediately if symptoms are severe.

## 2012-10-05 NOTE — Assessment & Plan Note (Signed)
BP Readings from Last 3 Encounters:  10/05/12 144/85  03/15/12 149/91  01/27/12 147/89    Lab Results  Component Value Date   NA 143 04/14/2012   K 3.6 04/14/2012   CREATININE 0.68 04/14/2012    Assessment:  Blood pressure control: mildly elevated  Progress toward BP goal:  improved  Comments:   Plan:  Medications:  continue current medications  Educational resources provided: handout  Self management tools provided: home blood pressure logbook  Other plans: Today blood pressure is slightly elevated. Blood pressure is 144/85. Patient reports that she has not taken her lisinopril since 09/30/12 because of her and out of her medications. Patient reports that she will pick up her medication and not restarted today. I think the slight elevation of her blood pressure is likely due to recent noncompliance, will encourage patient to take medications regularly. Will not change her regimen today.

## 2012-11-10 ENCOUNTER — Other Ambulatory Visit: Payer: Self-pay | Admitting: Internal Medicine

## 2012-11-10 ENCOUNTER — Telehealth: Payer: Self-pay | Admitting: *Deleted

## 2012-11-10 ENCOUNTER — Ambulatory Visit: Payer: No Typology Code available for payment source

## 2012-11-10 DIAGNOSIS — E785 Hyperlipidemia, unspecified: Secondary | ICD-10-CM

## 2012-11-10 MED ORDER — LOVASTATIN 20 MG PO TABS: 20.0000 mg | ORAL_TABLET | Freq: Every day | ORAL | Status: DC

## 2012-11-10 NOTE — Telephone Encounter (Signed)
I will switch her to Lovastatin 20 mg daily. I sent prescription to pharmacy already.  Lorretta Harp, MD PGY2, Internal Medicine Teaching Service Pager: [phone]

## 2012-11-10 NOTE — Telephone Encounter (Signed)
Pt aware.

## 2012-11-10 NOTE — Telephone Encounter (Signed)
Pt stopped by [facility] and states unable to get Pravastatin - no longer on  $4.00 list. Is there

## 2012-11-10 NOTE — Progress Notes (Signed)
Patient could not afford Pravastatin which is not on the $ 4.0 list any more. Will switch to Lovastatin 20 mg daily.   Lorretta Harp, MD PGY2, Internal Medicine Teaching Service Pager: [phone]

## 2012-11-10 NOTE — Telephone Encounter (Signed)
Pt stopped by [facility] and states Pravastatin is not on $4.00 list at [facility]. Needs refill on chol med - but needs something for $4.00 for chol. Pt wants to stay with [facility]. Stanton Kidney Nasire Reali RN 11/10/12 9AM

## 2012-12-21 ENCOUNTER — Encounter: Payer: PRIVATE HEALTH INSURANCE | Admitting: Internal Medicine

## 2013-02-01 ENCOUNTER — Encounter: Payer: PRIVATE HEALTH INSURANCE | Admitting: Internal Medicine

## 2013-02-22 ENCOUNTER — Encounter: Payer: PRIVATE HEALTH INSURANCE | Admitting: Internal Medicine

## 2013-03-01 ENCOUNTER — Encounter: Payer: Self-pay | Admitting: Internal Medicine

## 2013-03-01 ENCOUNTER — Ambulatory Visit (INDEPENDENT_AMBULATORY_CARE_PROVIDER_SITE_OTHER): Payer: PRIVATE HEALTH INSURANCE | Admitting: Internal Medicine

## 2013-03-01 VITALS — BP 109/73 | HR 82 | Temp 98.2°F | Ht 69.0 in | Wt 185.1 lb

## 2013-03-01 DIAGNOSIS — F17200 Nicotine dependence, unspecified, uncomplicated: Secondary | ICD-10-CM

## 2013-03-01 DIAGNOSIS — E785 Hyperlipidemia, unspecified: Secondary | ICD-10-CM

## 2013-03-01 DIAGNOSIS — I10 Essential (primary) hypertension: Secondary | ICD-10-CM

## 2013-03-01 DIAGNOSIS — Z0000 Encounter for general adult medical examination without abnormal findings: Secondary | ICD-10-CM

## 2013-03-01 DIAGNOSIS — Z720 Tobacco use: Secondary | ICD-10-CM

## 2013-03-01 NOTE — Assessment & Plan Note (Addendum)
BP Readings from Last 3 Encounters:  03/01/13 109/73  10/05/12 144/85  03/15/12 149/91    Lab Results  Component Value Date   NA 143 04/14/2012   K 3.6 04/14/2012   CREATININE 0.68 04/14/2012    Assessment: Blood pressure control: controlled Progress toward BP goal:  at goal Comments:   Plan: Medications:  continue current medications Educational resources provided: brochure Self management tools provided: home blood pressure logbook Other plans: it is well controlled. Last BMP was normal at 01/27/12. Since her on triamterene and lisinopril, it is important to make sure her K is OK. Will repeat BMP today.

## 2013-03-01 NOTE — Assessment & Plan Note (Signed)
  Assessment: Progress toward smoking cessation:  smoking less Barriers to progress toward smoking cessation:  lack of motivation to quit Comments:   Plan: Instruction/counseling given:  I counseled patient on the dangers of tobacco use, advised patient to stop smoking, and reviewed strategies to maximize success. Educational resources provided:  Quitline[place] (1-800-QUIT-NOW) brochure Self management tools provided:    Medications to assist with smoking cessation:  None patient does not need per her. Patient agreed to the following self-care plans for smoking cessation:    Other plans: patient was counseled and she would like to try her best to quit smoking.

## 2013-03-01 NOTE — Patient Instructions (Signed)
1. You have done great job in taking all your medications. I appreciate it very much. Please continue doing that. 2. I will check your stool for occult blood, if it is positive for blood, you may need to do colonoscopy. 3. If you have worsening of your symptoms or new symptoms arise, please call the clinic ([phone]), or go to the ER immediately if symptoms are severe.

## 2013-03-01 NOTE — Progress Notes (Signed)
Patient ID: CALAYAH GUADARRAMA, female   DOB: [birth year], 52 y.o.   MRN: [id number] Subjective:   Patient ID: CALEY CIARAMITARO female   DOB: [birth year] 52 y.o.   MRN: [id number]  CC:  follow up visit.   HPI:  Ms.Brinleigh Lacretia Nicks Didonato is a 52 y.o.  Lady with past medical history as outlined below, who presents for followup visit.  Patient's reports that she has been taking all her medications regularly. She does she doesn't have any new complaints.   1. HTN:  Bp is 109/73. Patient does not have chest pain, shortness of breath, palpitation or leg edema.  2. HLD: last LDL was 92 on 01/27/12. LFT was normal on 01/27/12. She has not noticed any side effects of statine such as muscle pain.  3.  Tobacco abuse: Patient reports that she has cut down his smoking from 0.5 PAD to current 5 to 6 cigarette per day. When asked whether she needs nicotine patch, she said she would like to try to quit smoing without using any medications first.   ROS: Denies fever, chills, fatigue, headaches,  cough, chest pain, SOB,  abdominal pain,diarrhea, constipation, dysuria, urgency, frequency, hematuria, joint pain or leg swelling.   Past Medical History  Diagnosis Date  . Vaginal bleeding, abnormal June 2010    Thought to be 2/2 DUB vs endometrial CA. Gyn referral made at that time.  . Allergic rhinitis   . HTN (hypertension)   . Hyperlipidemia   . Osteoarthritis     Left hip  . Substance abuse     tobacco, alcohol  . Keratoconus     f/u @ [facility]. Not needing corneal transplant.   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 2 pap smears since then normal. [facility]   Current Outpatient Prescriptions  Medication Sig Dispense Refill  . calcium-vitamin D (OSCAL WITH D 500-200) 500-200 MG-UNIT per tablet Take 1 tablet by mouth 2 (two) times daily.        Marland Kitchen lisinopril (ZESTRIL) 10 MG tablet Take 1 tablet (10 mg total) by mouth daily.  30 tablet  5  . lovastatin (MEVACOR) 20 MG  tablet Take 1 tablet (20 mg total) by mouth daily.  30 tablet  11  . triamterene-hydrochlorothiazide (MAXZIDE-25) 37.5-25 MG per tablet Take 1 each (1 tablet total) by mouth daily.  30 tablet  11   No current facility-administered medications for this visit.   Family History  Problem Relation Age of Onset  . Appendicitis Father     deceased   History   Social History  . Marital Status: Legally Separated    Spouse Name: N/A    Number of Children: N/A  . Years of Education: N/A   Social History Main Topics  . Smoking status: Current Every Day Smoker -- 0.50 packs/day    Types: Cigarettes  . Smokeless tobacco: Current User  . Alcohol Use: Yes     Comment: occasionally  . Drug Use: No  . Sexually Active: Not on file   Other Topics Concern  . 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 at [employer] and has [facility] card, [employer] September 24, 2010 4.31pm             Review of  Systems: Full 14-point review of systems otherwise negative. See HPI.   Objective:  Physical Exam: There were no vitals filed for this visit. Constitutional: Vital signs reviewed.  Patient is a well-developed and well-nourished, in no acute distress and cooperative with exam.   HEENT:  Head: Normocephalic and atraumatic Ear: TM normal bilaterally Mouth: no erythema or exudates, MMM Eyes: PERRL, EOMI, conjunctivae normal, No scleral icterus.  Neck: Supple, Trachea midline normal ROM, No JVD, mass, thyromegaly, or carotid bruit present. No lymph node enlargement. Cardiovascular: RRR, S1 normal, S2 normal, no MRG, pulses symmetric and intact bilaterally Pulmonary/Chest: CTAB, no wheezes, rales, or rhonchi Abdominal: Soft. Non-tender, non-distended, bowel sounds are normal, no masses, organomegaly, or guarding present.  GU: no CVA tenderness Musculoskeletal: No joint deformities,  erythema, or stiffness, ROM full and non-tender Hematology: no cervical, inginal, or axillary adenopathy.  Neurological: A&O x3, Strength is normal and symmetric bilaterally, cranial nerve II-XII are grossly intact, no focal motor deficit, sensory intact to light touch bilaterally. Brachial reflex 2+ bilaterally. Knee reflex 2+ bilaterally. Babinski's sign negative. Finger to nose test normal. Skin: Warm, dry and intact. No rash, cyanosis, or clubbing.  Psychiatric: Normal mood and affect. speech and behavior is normal. Judgment and thought content normal. Cognition and memory are normal.   Assessment & Plan:

## 2013-03-01 NOTE — Assessment & Plan Note (Signed)
Patient refused colonoscopy today. 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 She agreed to do FOBT cards today.

## 2013-03-01 NOTE — Assessment & Plan Note (Signed)
Her LDL was 92 on 01/27/12. Patient is on lovastatin 20 mg daily. She did not have side effects, such as muscle pain. Her liver function was normal on 01/27/12. Will repeat Lipid panel today.

## 2013-03-02 LAB — BASIC METABOLIC PANEL WITH GFR
Chloride: 101 mEq/L (ref 96–112)
Creat: 0.75 mg/dL (ref 0.50–1.10)
GFR, Est Non African American: >89 mL/min
Potassium: 3.7 mEq/L (ref 3.5–5.3)
Sodium: 139 mEq/L (ref 135–145)

## 2013-03-02 LAB — LIPID PANEL
Cholesterol: 200 mg/dL (ref 0–200)
HDL: 83 mg/dL (ref >39)
Total CHOL/HDL Ratio: 2.4 Ratio
Triglycerides: 101 mg/dL (ref <150)

## 2013-03-02 NOTE — Progress Notes (Signed)
Case discussed with Dr. Clyde Lundborg immediately after the resident saw the patient. We reviewed the resident's history and exam and pertinent patient test results. I agree with the assessment, diagnosis and plan of care documented in the resident's note.

## 2013-03-08 ENCOUNTER — Other Ambulatory Visit: Payer: Self-pay | Admitting: Internal Medicine

## 2013-03-08 DIAGNOSIS — Z1231 Encounter for screening mammogram for malignant neoplasm of breast: Secondary | ICD-10-CM

## 2013-03-31 ENCOUNTER — Other Ambulatory Visit: Payer: Self-pay | Admitting: *Deleted

## 2013-03-31 DIAGNOSIS — I10 Essential (primary) hypertension: Secondary | ICD-10-CM

## 2013-03-31 MED ORDER — LISINOPRIL 10 MG PO TABS: 10.0000 mg | ORAL_TABLET | Freq: Every day | ORAL | Status: DC

## 2013-03-31 MED ORDER — TRIAMTERENE-HCTZ 37.5-25 MG PO TABS
1.0000 | ORAL_TABLET | Freq: Every day | ORAL | Status: DC
Start: 2013-03-31 — End: 2014-03-27

## 2013-03-31 NOTE — Telephone Encounter (Signed)
lisinopril filled at [facility] - I will call this one in. Maxzide filled at [facility].

## 2013-04-07 ENCOUNTER — Ambulatory Visit (HOSPITAL_COMMUNITY)
Admission: RE | Admit: 2013-04-07 | Discharge: 2013-04-07 | Disposition: A | Discharge status: Home / Self Care | Payer: PRIVATE HEALTH INSURANCE | Source: Ambulatory Visit | Attending: Internal Medicine | Admitting: Internal Medicine

## 2013-04-07 DIAGNOSIS — Z1231 Encounter for screening mammogram for malignant neoplasm of breast: Secondary | ICD-10-CM | POA: Insufficient documentation

## 2013-04-21 ENCOUNTER — Telehealth: Payer: Self-pay | Admitting: *Deleted

## 2013-04-21 ENCOUNTER — Other Ambulatory Visit: Payer: Self-pay | Admitting: Internal Medicine

## 2013-04-21 NOTE — Telephone Encounter (Signed)
Pt calls and request seasonal allergy meds, states she is bothered by coughing, sneezing, runny nose, red eyes. Pt has stopped smoking. She feels congested

## 2013-04-22 ENCOUNTER — Other Ambulatory Visit: Payer: Self-pay | Admitting: Internal Medicine

## 2013-04-22 DIAGNOSIS — J309 Allergic rhinitis, unspecified: Secondary | ICD-10-CM

## 2013-04-22 MED ORDER — CETIRIZINE HCL 5 MG PO CHEW: 5.0000 mg | CHEWABLE_TABLET | Freq: Every evening | ORAL | Status: DC | PRN

## 2013-04-22 NOTE — Telephone Encounter (Signed)
Zyrtec (Cetirizine) prescription was sent to patient's pharmacy.  Lorretta Harp, MD PGY3, Internal Medicine Teaching Service Pager: [phone]

## 2013-05-04 ENCOUNTER — Ambulatory Visit: Payer: PRIVATE HEALTH INSURANCE

## 2013-09-20 ENCOUNTER — Ambulatory Visit: Payer: Self-pay

## 2013-09-20 ENCOUNTER — Ambulatory Visit: Payer: No Typology Code available for payment source

## 2013-09-27 ENCOUNTER — Encounter: Payer: Self-pay | Admitting: Internal Medicine

## 2013-09-27 ENCOUNTER — Other Ambulatory Visit: Payer: Self-pay | Admitting: Internal Medicine

## 2013-09-27 ENCOUNTER — Ambulatory Visit: Payer: Self-pay | Admitting: Internal Medicine

## 2013-09-27 ENCOUNTER — Other Ambulatory Visit: Payer: Self-pay | Admitting: *Deleted

## 2013-09-27 DIAGNOSIS — I10 Essential (primary) hypertension: Secondary | ICD-10-CM

## 2013-09-27 MED ORDER — LISINOPRIL 10 MG PO TABS: 10.0000 mg | ORAL_TABLET | Freq: Every day | ORAL | Status: DC

## 2013-09-27 NOTE — Telephone Encounter (Signed)
Rx called in to pharmacy. 

## 2013-11-01 ENCOUNTER — Encounter: Payer: No Typology Code available for payment source | Admitting: Internal Medicine

## 2013-11-08 ENCOUNTER — Encounter: Payer: No Typology Code available for payment source | Admitting: Internal Medicine

## 2013-11-24 ENCOUNTER — Other Ambulatory Visit: Payer: Self-pay | Admitting: Internal Medicine

## 2013-11-24 DIAGNOSIS — I10 Essential (primary) hypertension: Secondary | ICD-10-CM

## 2013-11-29 ENCOUNTER — Encounter: Payer: Self-pay | Admitting: Internal Medicine

## 2013-11-29 ENCOUNTER — Ambulatory Visit (INDEPENDENT_AMBULATORY_CARE_PROVIDER_SITE_OTHER): Payer: No Typology Code available for payment source | Admitting: Internal Medicine

## 2013-11-29 VITALS — BP 130/74 | HR 99 | Temp 97.8°F | Ht 69.0 in | Wt 206.2 lb

## 2013-11-29 DIAGNOSIS — E785 Hyperlipidemia, unspecified: Secondary | ICD-10-CM

## 2013-11-29 DIAGNOSIS — Z1211 Encounter for screening for malignant neoplasm of colon: Secondary | ICD-10-CM

## 2013-11-29 DIAGNOSIS — I10 Essential (primary) hypertension: Secondary | ICD-10-CM

## 2013-11-29 DIAGNOSIS — Z0000 Encounter for general adult medical examination without abnormal findings: Secondary | ICD-10-CM

## 2013-11-29 LAB — BASIC METABOLIC PANEL WITH GFR
BUN: 11 mg/dL (ref 6–23)
CALCIUM: 9.7 mg/dL (ref 8.4–10.5)
CO2: 30 meq/L (ref 19–32)
CREATININE: 0.67 mg/dL (ref 0.50–1.10)
Chloride: 100 mEq/L (ref 96–112)
GFR, Est African American: >89 mL/min
GFR, Est Non African American: >89 mL/min
GLUCOSE: 91 mg/dL (ref 70–99)
Potassium: 3.9 mEq/L (ref 3.5–5.3)
Sodium: 138 mEq/L (ref 135–145)

## 2013-11-29 MED ORDER — LOVASTATIN 20 MG PO TABS: ORAL_TABLET | ORAL | Status: DC

## 2013-11-29 NOTE — Assessment & Plan Note (Signed)
BP Readings from Last 3 Encounters:  11/29/13 130/74  03/01/13 109/73  10/05/12 144/85    Lab Results  Component Value Date   NA 139 03/01/2013   K 3.7 03/01/2013   CREATININE 0.75 03/01/2013    Assessment: Blood pressure control: controlled Progress toward BP goal:  at goal Comments:   Plan: Medications:  continue current medications Educational resources provided: brochure Self management tools provided:   Other plans: Blood pressure is well controlled. Patient is taking lisinopril 10 mg daily, and Maxzide 37.5/25 mg daily. We'll check BMP.

## 2013-11-29 NOTE — Progress Notes (Signed)
Patient ID: ARUNA NESTLER, female   DOB: [birth year], 53 y.o.   MRN: [id number] Subjective:   Patient ID: CASSAUNDRA RASCH female   DOB: 22-Apr-1961 53 y.o.   MRN: [id number]  CC:   Follow up visit.     HPI:  Ms.Pammie Viona Gilmore Riemenschneider is a 53 y.o. lady with past medical history as outlined below, who presents for a followup visit today  1. HTN:  Bp is 130/74. Patient does not have chest pain, shortness of breath, palpitation or leg edema.  2. HLD: last LDL was 97 on 03/01/13. LFT was normal on 01/27/12. She has not noticed any side effects of statine such as muscle pain.  3. Tobacco abuse: Patient stopped smoking on 03/29/13 after having smoked for more than 35 years. She has been doing well without any withdraw symptoms. Patient was congratulated.  ROS: Denies fever, chills, fatigue, headaches,  cough, chest pain, SOB,  abdominal pain,diarrhea, constipation, dysuria, urgency, frequency, hematuria, joint pain or leg swelling.     Past Medical History  Diagnosis Date  . Vaginal bleeding, abnormal June 2010    Thought to be 2/2 DUB vs endometrial CA. Gyn referral made at that time.  . Allergic rhinitis   . HTN (hypertension)   . Hyperlipidemia   . Osteoarthritis     Left hip  . Substance abuse     tobacco, alcohol  . Keratoconus     f/u @ [facility] eye center. Not needing corneal transplant.   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 2 pap smears since then normal. [facility]   Current Outpatient Prescriptions  Medication Sig Dispense Refill  . calcium-vitamin D (OSCAL WITH D 500-200) 500-200 MG-UNIT per tablet Take 1 tablet by mouth 2 (two) times daily.        . cetirizine (ZYRTEC) 5 MG chewable tablet Chew 1 tablet (5 mg total) by mouth at bedtime as needed for allergies.  30 tablet  0  . lisinopril (ZESTRIL) 10 MG tablet Take 1 tablet (10 mg total) by mouth daily.  30 tablet  5  . lovastatin (MEVACOR) 20 MG tablet TAKE ONE TABLET BY MOUTH ONCE DAILY  30  tablet  5  . triamterene-hydrochlorothiazide (MAXZIDE-25) 37.5-25 MG per tablet Take 1 tablet by mouth daily.  30 tablet  11   No current facility-administered medications for this visit.   Family History  Problem Relation Age of Onset  . Appendicitis Father     deceased   History   Social History  . Marital Status: Single    Spouse Name: N/A    Number of Children: N/A  . Years of Education: N/A   Social History Main Topics  . Smoking status: Current Every Day Smoker -- 0.50 packs/day    Types: Cigarettes  . Smokeless tobacco: Current User  . Alcohol Use: Yes     Comment: occasionally  . Drug Use: No  . Sexual Activity: Not on file   Other Topics Concern  . Not on file   Social History Narrative   Divorced   3 children   Works at [employer] as a Programme researcher, 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 [employer] at [employer] and has [facility] card, [employer] September 24, 2010 4.31pm             Review of Systems: Full 14-point review of systems otherwise negative. See HPI.   Objective:  Physical Exam: Filed Vitals:   11/29/13 1538  BP: 130/74  Pulse: 99  Temp: 97.8 F (36.6 C)  TempSrc: Oral  Height: 5\' 9"  (1.753 m)  Weight: 206 lb 3.2 oz (93.532 kg)  SpO2: 99%   Constitutional: Vital signs reviewed.  Patient is a well-developed and well-nourished, in no acute distress and cooperative with exam.   HEENT:  Head: Normocephalic and atraumatic Mouth: no erythema or exudates, MMM Eyes: PERRL, EOMI, conjunctivae normal, No scleral icterus.  Neck: Supple, Trachea midline normal ROM, No JVD  Cardiovascular: RRR, S1 normal, S2 normal, no MRG, pulses symmetric and intact bilaterally Pulmonary/Chest: CTAB, no wheezes, rales, or rhonchi Abdominal: Soft. Non-tender, non-distended, bowel sounds are normal, no masses, organomegaly, or guarding present.  GU: no CVA tenderness Musculoskeletal: No joint deformities, erythema, or stiffness, ROM full and  non-tender Extremities: No leg edema Hematology: no cervical, inginal, or axillary adenopathy.  Neurological: A&O x3, Strength is normal and symmetric bilaterally, cranial nerve II-XII are grossly intact, no focal motor deficit, sensory intact to light touch bilaterally. Brachial reflex 2+ bilaterally. Skin: Warm, dry and intact. No rash, cyanosis, or clubbing.  Psychiatric: Normal mood and affect. No suicidal or homicidal ideation.  Assessment & Plan:

## 2013-11-29 NOTE — Assessment & Plan Note (Signed)
Last LDL was 97 on 03/01/13. LFT was normal on 01/27/12. She has not noticed any side effects of statine such as muscle pain. We'll continue lovastatin 20 mg daily.

## 2013-11-29 NOTE — Assessment & Plan Note (Signed)
-  Patient refused colonoscopy. We'll give Hemoccult cards today. -Patient refused the flu shot, will postpone.

## 2013-11-29 NOTE — Patient Instructions (Signed)
1. Please return the Hemoccult cards.  2. Please take all medications as prescribed.  3. If you have worsening of your symptoms or new symptoms arise, please call the clinic ([phone]), or go to the ER immediately if symptoms are severe.  You have done great job in taking all your medications. I appreciate it very much. Please continue doing that.  Please bring in all your medication bottles with you in next visit.

## 2013-12-02 NOTE — Progress Notes (Signed)
Case discussed with Dr. Blaine Hamper at the time of the visit.  We reviewed the resident's history and exam and pertinent patient test results.  I agree with the assessment, diagnosis, and plan of care documented in the resident's note.

## 2013-12-14 ENCOUNTER — Other Ambulatory Visit (INDEPENDENT_AMBULATORY_CARE_PROVIDER_SITE_OTHER): Payer: No Typology Code available for payment source

## 2013-12-14 DIAGNOSIS — Z1211 Encounter for screening for malignant neoplasm of colon: Secondary | ICD-10-CM

## 2013-12-14 LAB — POC HEMOCCULT BLD/STL (HOME/3-CARD/SCREEN)
Card #2 Fecal Occult Blod, POC: NEGATIVE
FECAL OCCULT BLD: NEGATIVE
Fecal Occult Blood, POC: NEGATIVE

## 2014-02-28 ENCOUNTER — Other Ambulatory Visit: Payer: Self-pay | Admitting: *Deleted

## 2014-02-28 DIAGNOSIS — I10 Essential (primary) hypertension: Secondary | ICD-10-CM

## 2014-02-28 MED ORDER — LISINOPRIL 10 MG PO TABS
10.0000 mg | ORAL_TABLET | Freq: Every day | ORAL | Status: DC
Start: 2014-02-28 — End: 2014-09-05

## 2014-03-01 NOTE — Telephone Encounter (Signed)
Rx called in to pharmacy. 

## 2014-03-14 ENCOUNTER — Ambulatory Visit: Payer: No Typology Code available for payment source

## 2014-03-16 ENCOUNTER — Ambulatory Visit: Payer: No Typology Code available for payment source

## 2014-03-27 ENCOUNTER — Other Ambulatory Visit: Payer: Self-pay | Admitting: Internal Medicine

## 2014-03-27 NOTE — Telephone Encounter (Signed)
Message sent to front office for an appt.  

## 2014-03-27 NOTE — Telephone Encounter (Signed)
Needs Sept (+/- one month) appt with PCP for routine F/U

## 2014-04-04 ENCOUNTER — Other Ambulatory Visit: Payer: Self-pay | Admitting: Internal Medicine

## 2014-04-04 DIAGNOSIS — Z1231 Encounter for screening mammogram for malignant neoplasm of breast: Secondary | ICD-10-CM

## 2014-04-18 ENCOUNTER — Ambulatory Visit (HOSPITAL_COMMUNITY)
Admission: RE | Admit: 2014-04-18 | Discharge: 2014-04-18 | Disposition: A | Discharge status: Home / Self Care | Payer: No Typology Code available for payment source | Source: Ambulatory Visit | Attending: Internal Medicine | Admitting: Internal Medicine

## 2014-04-18 DIAGNOSIS — Z1231 Encounter for screening mammogram for malignant neoplasm of breast: Secondary | ICD-10-CM | POA: Insufficient documentation

## 2014-04-25 ENCOUNTER — Ambulatory Visit (INDEPENDENT_AMBULATORY_CARE_PROVIDER_SITE_OTHER): Payer: No Typology Code available for payment source | Admitting: Internal Medicine

## 2014-04-25 ENCOUNTER — Encounter: Payer: Self-pay | Admitting: Internal Medicine

## 2014-04-25 VITALS — BP 134/76 | HR 86 | Temp 97.6°F | Ht 69.0 in | Wt 196.9 lb

## 2014-04-25 DIAGNOSIS — R011 Cardiac murmur, unspecified: Secondary | ICD-10-CM

## 2014-04-25 DIAGNOSIS — E663 Overweight: Secondary | ICD-10-CM | POA: Insufficient documentation

## 2014-04-25 DIAGNOSIS — E785 Hyperlipidemia, unspecified: Secondary | ICD-10-CM

## 2014-04-25 DIAGNOSIS — I10 Essential (primary) hypertension: Secondary | ICD-10-CM

## 2014-04-25 NOTE — Patient Instructions (Addendum)
It was a pleasure taking care of you today, Cassandra Park.  Here is a summary of what we discussed:  1. High Cholesterol - Blood work today - Continue Lovastatin  2. High Blood Pressure: - In good control - Continue walking and do your best to avoid sweets, fried foods - Continue taking Lisinopril and HCTZ  3. Weight Management: - Continue walking- you're doing a great job! - Avoid fast food, fried foods, cut down on sweets - Look over list of low calorie foods, add them to your diet - Appointment with nutritionist  Calorie Counting for Weight Loss Calories are energy you get from the things you eat and drink. Your body uses this energy to keep you going throughout the day. The number of calories you eat affects your weight. When you eat more calories than your body needs, your body stores the extra calories as fat. When you eat fewer calories than your body needs, your body burns fat to get the energy it needs. Calorie counting means keeping track of how many calories you eat and drink each day. If you make sure to eat fewer calories than your body needs, you should lose weight. In order for calorie counting to work, you will need to eat the number of calories that are right for you in a day to lose a healthy amount of weight per week. A healthy amount of weight to lose per week is usually 1-2 lb (0.5-0.9 kg). A dietitian can determine how many calories you need in a day and give you suggestions on how to reach your calorie goal.  WHAT IS MY MY PLAN? My goal is to have __________ calories per day.  If I have this many calories per day, I should lose around __________ pounds per week. WHAT DO I NEED TO KNOW ABOUT CALORIE COUNTING? In order to meet your daily calorie goal, you will need to:  Find out how many calories are in each food you would like to eat. Try to do this before you eat.  Decide how much of the food you can eat.  Write down what you ate and how many calories it had.  Doing this is called keeping a food log. WHERE DO I FIND CALORIE INFORMATION? The number of calories in a food can be found on a Nutrition Facts label. Note that all the information on a label is based on a specific serving of the food. If a food does not have a Nutrition Facts label, try to look up the calories online or ask your dietitian for help. HOW DO I DECIDE HOW MUCH TO EAT? To decide how much of the food you can eat, you will need to consider both the number of calories in one serving and the size of one serving. This information can be found on the Nutrition Facts label. If a food does not have a Nutrition Facts label, look up the information online or ask your dietitian for help. Remember that calories are listed per serving. If you choose to have more than one serving of a food, you will have to multiply the calories per serving by the amount of servings you plan to eat. For example, the label on a package of bread might say that a serving size is 1 slice and that there are 90 calories in a serving. If you eat 1 slice, you will have eaten 90 calories. If you eat 2 slices, you will have eaten 180 calories. HOW DO I KEEP A  FOOD LOG? After each meal, record the following information in your food log:  What you ate.  How much of it you ate.  How many calories it had.  Then, add up your calories. Keep your food log near you, such as in a small notebook in your pocket. Another option is to use a mobile app or website. Some programs will calculate calories for you and show you how many calories you have left each time you add an item to the log. WHAT ARE SOME CALORIE COUNTING TIPS?  Use your calories on foods and drinks that will fill you up and not leave you hungry. Some examples of this include foods like nuts and nut butters, vegetables, lean proteins, and high-fiber foods (more than 5 g fiber per serving).  Eat nutritious foods and avoid empty calories. Empty calories are calories you  get from foods or beverages that do not have many nutrients, such as candy and soda. It is better to have a nutritious high-calorie food (such as an avocado) than a food with few nutrients (such as a bag of chips).  Know how many calories are in the foods you eat most often. This way, you do not have to look up how many calories they have each time you eat them.  Look out for foods that may seem like low-calorie foods but are really high-calorie foods, such as baked goods, soda, and fat-free candy.  Pay attention to calories in drinks. Drinks such as sodas, specialty coffee drinks, alcohol, and juices have a lot of calories yet do not fill you up. Choose low-calorie drinks like water and diet drinks.  Focus your calorie counting efforts on higher calorie items. Logging the calories in a garden salad that contains only vegetables is less important than calculating the calories in a milk shake.  Find a way of tracking calories that works for you. Get creative. Most people who are successful find ways to keep track of how much they eat in a day, even if they do not count every calorie. WHAT ARE SOME PORTION CONTROL TIPS?  Know how many calories are in a serving. This will help you know how many servings of a certain food you can have.  Use a measuring cup to measure serving sizes. This is helpful when you start out. With time, you will be able to estimate serving sizes for some foods.  Take some time to put servings of different foods on your favorite plates, bowls, and cups so you know what a serving looks like.  Try not to eat straight from a bag or box. Doing this can lead to overeating. Put the amount you would like to eat in a cup or on a plate to make sure you are eating the right portion.  Use smaller plates, glasses, and bowls to prevent overeating. This is a quick and easy way to practice portion control. If your plate is smaller, less food can fit on it.  Try not to multitask while  eating, such as watching TV or using your computer. If it is time to eat, sit down at a table and enjoy your food. Doing this will help you to start recognizing when you are full. It will also make you more aware of what and how much you are eating. HOW CAN I CALORIE COUNT WHEN EATING OUT?  Ask for smaller portion sizes or child-sized portions.  Consider sharing an entree and sides instead of getting your own entree.  If you  get your own entree, eat only half. Ask for a box at the beginning of your meal and put the rest of your entree in it so you are not tempted to eat it.  Look for the calories on the menu. If calories are listed, choose the lower calorie options.  Choose dishes that include vegetables, fruits, whole grains, low-fat dairy products, and lean protein. Focusing on smart food choices from each of the 5 food groups can help you stay on track at restaurants.  Choose items that are boiled, broiled, grilled, or steamed.  Choose water, milk, unsweetened iced tea, or other drinks without added sugars. If you want an alcoholic beverage, choose a lower calorie option. For example, a regular margarita can have up to 700 calories and a glass of wine has around 150.  Stay away from items that are buttered, battered, fried, or served with cream sauce. Items labeled "crispy" are usually fried, unless stated otherwise.  Ask for dressings, sauces, and syrups on the side. These are usually very high in calories, so do not eat much of them.  Watch out for salads. Many people think salads are a healthy option, but this is often not the case. Many salads come with bacon, fried chicken, lots of cheese, fried chips, and dressing. All of these items have a lot of calories. If you want a salad, choose a garden salad and ask for grilled meats or steak. Ask for the dressing on the side, or ask for olive oil and vinegar or lemon to use as dressing.  Estimate how many servings of a food you are given. For  example, a serving of cooked rice is  cup or about the size of half a tennis ball or one cupcake wrapper. Knowing serving sizes will help you be aware of how much food you are eating at restaurants. The list below tells you how big or small some common portion sizes are based on everyday objects.  1 oz--4 stacked dice.  3 oz--1 deck of cards.  1 tsp--1 dice.  1 Tbsp-- a Ping-Pong ball.  2 Tbsp--1 Ping-Pong ball.   cup--1 tennis ball or 1 cupcake wrapper.  1 cup--1 baseball. Document Released: 09/01/2005 Document Revised: 01/16/2014 Document Reviewed: 07/07/2013 [facility] Patient Information 2015 [place], [place]. This information is not intended to replace advice given to you by your health care provider. Make sure you discuss any questions you have with your health care provider.

## 2014-04-25 NOTE — Assessment & Plan Note (Signed)
Pt's weight 185 in June 2014 --> 206 in March 2015, today is 196. Patient attributes weight gain to quitting smoking in July 2014. She has been making an effort to eat healthier and walk at least 3 times a week for 10-15 mins. She states she would like to increase her time walking to 20-30 mins. - Referral to nutrition - Continue walking, increase time to 20-30 mins 3x/week - Continue eating better, with avoiding fried foods, sweets

## 2014-04-25 NOTE — Assessment & Plan Note (Signed)
BP Readings from Last 3 Encounters:  04/25/14 134/76  11/29/13 130/74  03/01/13 109/73    Lab Results  Component Value Date   NA 138 11/29/2013   K 3.9 11/29/2013   CREATININE 0.67 11/29/2013    Assessment: Blood pressure control: controlled Progress toward BP goal:  at goal Comments:   Plan: Medications:  continue current medications Educational resources provided: brochure Self management tools provided: home blood pressure logbook Other plans:

## 2014-04-25 NOTE — Assessment & Plan Note (Signed)
Last lipid profile on 03/01/14 shows Chol 200, Trigly 101, HDL 83, LDL 97. She takes Lovastatin 20 mg daily.  - Continue Lovastatin

## 2014-04-25 NOTE — Progress Notes (Signed)
   Subjective:    Patient ID: Cassandra Park, female    DOB: Aug 28, 1961, 53 y.o.   MRN: [id number]  HPI Cassandra Park is a 53yo woman w/ PMHx of HTN, HLD, and osteoarthritis who presents today for a routine follow-up appointment with the following:  1. HTN: Patient's blood pressure well controlled. Today her BP is 134/76. She takes Triamterene- HCTZ 37.5-25 mg daily and Lisinopril 10 mg daily.    2. HLD: Last lipid profile on 03/01/14 shows Chol 200, Trigly 101, HDL 83, LDL 97. She takes Lovastatin 20 mg daily.   3. Weight management: Pt's weight was 201 in 2012 --> 185 in June 2014 --> 206 in March 2015. Today her weight is 196. She attributes some of her weight gain in March 2015 from quitting smoking. She also states she was eating more after she quit smoking. Patient is very motivated to continue weight loss and has been walking several days a week for 10-15 minutes. She reports eating healthier, with at least 3 servings of vegetables daily and avoiding fried foods. She admits that giving up sweets is hard for her but she is willing to try to cut down.   4. Hx of Tobacco Abuse: Patient had 35 year hx of smoking before quitting in July 2014. She continues to not smoke. She was commended for this achievement.    Review of Systems General: Denies fever, chills, changes in appetite, night sweats HEENT: Denies headaches, ear pain, rhinorrhea, sore throat CV: Denies CP, SOB, palpitations, orthopnea Pulm: Denies SOB, cough, wheezing GI: Denies abdominal pain, diarrhea, constipation, melena, hematochezia  GU: Denies dysuria, hematuria, frequency Msk: Denies muscle cramps, joint pains Neuro: Denies changes in vision, weakness, numbness, tingling Skin: Denies rashes, bruising    Objective:   Physical Exam General: appears stated age, sitting up in chair, NAD HEENT: [place]/AT, EOMI, PERRL, mucus membranes moist Neck: supple, no JVD CV: RRR, normal W5/I6, systolic ejection murmur heard best at 2nd  right intercostal space Pulm: CTA bilaterally, breaths non-labored Abd: BS+, soft, non-distended, non-tender Ext: warm, moves all, no edema Neuro: alert and oriented x 3, CNs II-XII intact, strength 5/5 in upper and lower extremities bilaterally      Assessment & Plan:

## 2014-04-25 NOTE — Assessment & Plan Note (Signed)
Systolic ejection murmur heard best at 2nd right intercostal space heard today. No S3 or S4 heard. Patient denies CP, SOB, palpitations, orthopnea, leg swelling. This has never been noted in previous notes. Aortic stenosis vs flow murmur.  - Will not work up at this time as patient asymptomatic but will continue to monitor

## 2014-04-26 LAB — LIPID PANEL
CHOL/HDL RATIO: 2.4 ratio
CHOLESTEROL: 182 mg/dL (ref 0–200)
HDL: 76 mg/dL (ref >39)
LDL Cholesterol: 82 mg/dL (ref 0–99)
Triglycerides: 119 mg/dL (ref <150)
VLDL: 24 mg/dL (ref 0–40)

## 2014-04-26 NOTE — Progress Notes (Signed)
I saw and evaluated the patient.  I personally confirmed the key portions of Dr. Shela Commons history and exam and reviewed pertinent patient test results.  The assessment, diagnosis, and plan were formulated together and I agree with the documentation in the resident's note.  Another reason we are just following the heart murmur is that on auscultation it is consistent with a flow murmur and there are no other heart sounds consistent with significant structural changes.  On my examination, there was no AI appreciated on close evaluation.

## 2014-05-02 ENCOUNTER — Ambulatory Visit (INDEPENDENT_AMBULATORY_CARE_PROVIDER_SITE_OTHER): Payer: No Typology Code available for payment source | Admitting: Dietician

## 2014-05-02 ENCOUNTER — Encounter: Payer: Self-pay | Admitting: Dietician

## 2014-05-02 ENCOUNTER — Telehealth: Payer: Self-pay | Admitting: *Deleted

## 2014-05-02 VITALS — Wt 188.2 lb

## 2014-05-02 DIAGNOSIS — E663 Overweight: Secondary | ICD-10-CM

## 2014-05-02 NOTE — Patient Instructions (Signed)
Eat something with protein with your coffee each morning-  peanut butter on wheat bread in morning  Okay to bake- eat deep fried seldom or oven fry using canola or olive  Eat 100% whole wheat bread  Or Triscuit (100% whole wheat) instead of store bought crackers  Try to eat at least 2 dairy every day- real cheese not processed like [place]- or swiss cheese,  Can drink soy milk or regular 1% milk  Bake a potato/sweet potato in microwave- scrub and poke a hole put in microwave for 4 or 5 minutes  Beans are great- buy canned low sodium or use dried   See you on September the 9th. Please write down every thing you eat and drink for a day before you come and bring it with you.   Feel free to call with questions Butch Penny [phone]

## 2014-05-02 NOTE — Progress Notes (Signed)
  Medical Nutrition Therapy:  Appt start time: 1030 end time:  1130.  Assessment:  Primary concerns today: eating healthy to help her heart murmur.   Patient is here with her mother who is very supportive to learn about healthy eating to help her heart murmur. Has been eating very little as she was confused by calorie counting information given to her and not sure what she should be eating.She brought labels of foods she uses from home is reading labels for sodium currently. Her diet is adequate in vegetables, improved in fruits since her recent changes, but still too high in salt and lacks dairy and whole grains Preferred Learning Style:No preference indicated  Learning Readiness: Change in progress Lost 8# in <2 weeks- encouraged her to eat more healthy foods and decrease her weight more gradually MEDICATIONS and labs noted.    DIETARY INTAKE: Usual eating pattern includes 2 meals and 3 snacks per day. Everyday foods include fried foods, candy, cookies and chips.  Avoided foods include milk, tea.   24-hr recall:  B (5 AM): coffee with sugar and creamer Snk ( 9AM):peanutbutter crackers or chips, mentos and peppermint  L (11 AM): salad at work with [place] and  D ( 3 PM): fried chicken, collards greens and corn Snk (6-8 PM): chips but is trying to eat fruit instead now Beverages: used to drink regular soda, juice coffee and water, but has stopped regular soda   Usual physical activity: work and walks several times a week  Estimated energy needs: 1800-2000 calories 250 g carbohydrates 80-90 g protein 60-70 g fat  Progress Towards Goal(s):  In progress.   Nutritional Diagnosis:  NB-1.1 Food and nutrition-related knowledge deficit As related to lack of prior exposure to education about nutrition.  As evidenced by her report, questions and concerns.    Intervention:  Nutrition education about healthy meal planning. Teaching Method Utilized: Visual, Auditory and Hands  on Handouts given during visit include:  15 healthy foods to buy on a budget  Fresh fruit and vegetables market times Barriers to learning/adherence to lifestyle change: low income, work schedule and working with food  Demonstrated degree of understanding via:  Teach Back   Monitoring/Evaluation:  Dietary intake, exercise, and body weight in 3 week(s).

## 2014-05-02 NOTE — Telephone Encounter (Signed)
Pt called - past 2 days having muscle spasms in shoulder, chest, back, hands and fingers. Had low potassium in past and had muscle spasms. Appt made 05/03/14 1:15PM Dr Gordy Levan. Hilda Blades Anelisse Jacobson RN 05/02/14 3:50PM

## 2014-05-03 ENCOUNTER — Encounter: Payer: Self-pay | Admitting: Internal Medicine

## 2014-05-03 ENCOUNTER — Ambulatory Visit (INDEPENDENT_AMBULATORY_CARE_PROVIDER_SITE_OTHER): Payer: No Typology Code available for payment source | Admitting: Internal Medicine

## 2014-05-03 VITALS — BP 123/76 | HR 104 | Temp 97.2°F | Ht 69.0 in | Wt 188.5 lb

## 2014-05-03 DIAGNOSIS — R011 Cardiac murmur, unspecified: Secondary | ICD-10-CM

## 2014-05-03 DIAGNOSIS — I10 Essential (primary) hypertension: Secondary | ICD-10-CM

## 2014-05-03 LAB — COMPLETE METABOLIC PANEL WITH GFR
ALK PHOS: 61 U/L (ref 39–117)
ALT: 19 U/L (ref 0–35)
AST: 24 U/L (ref 0–37)
Albumin: 4.9 g/dL (ref 3.5–5.2)
BUN: 17 mg/dL (ref 6–23)
CALCIUM: 10 mg/dL (ref 8.4–10.5)
CHLORIDE: 97 meq/L (ref 96–112)
CO2: 27 mEq/L (ref 19–32)
Creat: 0.93 mg/dL (ref 0.50–1.10)
GFR, Est African American: 80 mL/min
GFR, Est Non African American: 69 mL/min
Glucose, Bld: 131 mg/dL — ABNORMAL HIGH (ref 70–99)
POTASSIUM: 4.2 meq/L (ref 3.5–5.3)
SODIUM: 136 meq/L (ref 135–145)
TOTAL PROTEIN: 8 g/dL (ref 6.0–8.3)
Total Bilirubin: 0.5 mg/dL (ref 0.2–1.2)

## 2014-05-03 NOTE — Progress Notes (Signed)
Patient ID: Cassandra Park, female   DOB: September 24, 1960, 53 y.o.   MRN: [id number]    Subjective:   Patient ID: Cassandra Park female    DOB: [birth year] 53 y.o.    MRN: [id number] Health Maintenance Due: Health Maintenance Due  Topic Date Due  . Colonoscopy  09/23/2008  . Influenza Vaccine  04/15/2014    _________________________________________________  HPI: [name] is a 53 y.o. female here for an acute visit.  Pt has a PMH outlined below.  Please see problem-based charting assessment and plan note for further details of medical issues addressed at today's visit.  PMH: Past Medical History  Diagnosis Date  . Vaginal bleeding, abnormal June 2010    Thought to be 2/2 DUB vs endometrial CA. Gyn referral made at that time.  . Allergic rhinitis   . HTN (hypertension)   . Hyperlipidemia   . Osteoarthritis     Left hip  . Substance abuse     tobacco, alcohol  . 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 Not needing corneal transplant.   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 2 pap smears since then normal. [facility]    Medications: Current Outpatient Prescriptions on File Prior to Visit  Medication Sig Dispense Refill  . aspirin 81 MG tablet Take 81 mg by mouth daily.      . calcium-vitamin D (OSCAL WITH D 500-200) 500-200 MG-UNIT per tablet Take 1 tablet by mouth 2 (two) times daily.        Marland Kitchen lisinopril (ZESTRIL) 10 MG tablet Take 1 tablet (10 mg total) by mouth daily.  30 tablet  5  . lovastatin (MEVACOR) 20 MG tablet TAKE ONE TABLET BY MOUTH ONCE DAILY  30 tablet  5  . triamterene-hydrochlorothiazide (MAXZIDE-25) 37.5-25 MG per tablet TAKE ONE TABLET BY MOUTH ONCE DAILY  30 tablet  11   No current facility-administered medications on file prior to visit.    Allergies: No Known Allergies  FH: Family History  Problem Relation Age of Onset  . Appendicitis Father     deceased    SH: History   Social History  . Marital  Status: Single    Spouse Name: N/A    Number of Children: N/A  . Years of Education: N/A   Social History Main Topics  . Smoking status: Former Smoker -- 0.50 packs/day    Types: Cigarettes  . Smokeless tobacco: Former Systems developer    Quit date: 03/29/2013     Comment: already quit on 03/29/13   . Alcohol Use: Yes     Comment: occasionally  . Drug Use: No  . Sexual Activity: None   Other Topics Concern  . 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  Eyes: Negative for blurred vision.  Respiratory: Negative for cough and shortness of breath.  Cardiovascular: Negative for chest pain, palpitations and leg swelling.  Gastrointestinal: Negative for nausea, vomiting, abdominal pain, diarrhea, constipation and blood in stool.  Genitourinary: Negative for dysuria, urgency and frequency.  Musculoskeletal: Negative for myalgias and  back pain.  Neurological: Negative for dizziness, weakness and headaches.     Objective:   Vital Signs: Filed Vitals:   05/03/14 1318  BP: 123/76  Pulse: 104  Temp: 97.2 F (36.2 C)  Height: 5\' 9"  (1.753 m)  Weight: 188 lb 8 oz (85.503 kg)  SpO2: 100%      BP Readings from Last 3 Encounters:  05/03/14 123/76  04/25/14 134/76  11/29/13 130/74    Physical Exam: Constitutional: Vital signs reviewed.  Patient is well-developed and well-nourished in NAD and cooperative with exam.  Head: Normocephalic and atraumatic. Eyes: PERRL, EOMI, conjunctivae nl, no scleral icterus.  Neck: Supple. Cardiovascular: tachycardic, 3/6 systolic murmur at right upper sternal border Pulmonary/Chest: normal effort, non-tender to palpation, CTAB, no wheezes, rales, or rhonchi. Abdominal: Soft. NT/ND  +BS. Musculoskeletal: Full range ofmotion. no pain,edema,or deformity.  Nocyanosis,clubbing,oredema. Neurological: A&O x3, cranial nerves II-XII are grossly intact, moving all extremities. Extremities: 2+DP b/l; no pitting edema. Skin: Warm, dry and intact. No rash.    Assessment & Plan:   Assessment and plan was discussed and formulated with my attending.

## 2014-05-03 NOTE — Patient Instructions (Addendum)
Thank you for your visit today.  We will check some labs today to see if your potassium is low. We will also order a picture of your heart.    Please return to the internal medicine clinic in to see your PCP as needed.      Your current medical regimen is effective;  continue present plan and take all medications as prescribed.    Please be sure to bring all of your medications with you to every visit; this includes herbal supplements, vitamins, eye drops, and any over-the-counter medications.   Should you have any questions regarding your medications and/or any new or worsening symptoms, please be sure to call the clinic at [phone].   If you believe that you are suffering from a life threatening condition or one that may result in the loss of limb or function, then you should call 911 or proceed to the nearest Emergency Department.     A healthy lifestyle and preventative care can promote health and wellness.   Maintain regular health, dental, and eye exams.  Eat a healthy diet. Foods like vegetables, fruits, whole grains, low-fat dairy products, and lean protein foods contain the nutrients you need without too many calories. Decrease your intake of foods high in solid fats, added sugars, and salt. Get information about a proper diet from your caregiver, if necessary.  Regular physical exercise is one of the most important things you can do for your health. Most adults should get at least 150 minutes of moderate-intensity exercise (any activity that increases your heart rate and causes you to sweat) each week. In addition, most adults need muscle-strengthening exercises on 2 or more days a week.   Maintain a healthy weight. The body mass index (BMI) is a screening tool to identify possible weight problems. It provides an estimate of body fat based on height and weight. Your caregiver can help determine your BMI, and can help you achieve or maintain a healthy weight. For adults 20  years and older:  A BMI below 18.5 is considered underweight.  A BMI of 18.5 to 24.9 is normal.  A BMI of 25 to 29.9 is considered overweight.  A BMI of 30 and above is considered obese.

## 2014-05-03 NOTE — Assessment & Plan Note (Signed)
-  BP well controlled -continue current meds 

## 2014-05-04 NOTE — Assessment & Plan Note (Signed)
Pt returns to clinic because she is concerned about a murmur that was heard on her last OV.  Reports also that she had muscle spasms for 15 seconds yesterday in her shoulders and legs.  She does have a 3/6 systolic murmur at the right upper sternal border that radiates to the carotids.  She reports having occasional dizziness and feeling fatigued. Also is mildly tachycardic.  Never told she had a murmur previously.  She is extremely worried about this murmur and I feel that further workup is necessary.   -TTE ordered today -return to clinic to follow-up with PCP

## 2014-05-05 ENCOUNTER — Ambulatory Visit (HOSPITAL_COMMUNITY)
Admission: RE | Admit: 2014-05-05 | Discharge: 2014-05-05 | Disposition: A | Discharge status: Home / Self Care | Payer: No Typology Code available for payment source | Source: Ambulatory Visit | Attending: Orthopedic Surgery | Admitting: Orthopedic Surgery

## 2014-05-05 DIAGNOSIS — R011 Cardiac murmur, unspecified: Secondary | ICD-10-CM

## 2014-05-05 DIAGNOSIS — I10 Essential (primary) hypertension: Secondary | ICD-10-CM

## 2014-05-05 NOTE — Progress Notes (Signed)
Echocardiogram 2D Echocardiogram has been performed.  Cassandra Park M 05/05/2014, 1:41 PM

## 2014-05-08 NOTE — Progress Notes (Signed)
INTERNAL MEDICINE TEACHING ATTENDING ADDENDUM - Sharlyn Odonnel, MD: I reviewed and discussed at the time of visit with the resident Dr. Gill, the patient's medical history, physical examination, diagnosis and results of pertinent tests and treatment and I agree with the patient's care as documented.  

## 2014-05-24 ENCOUNTER — Ambulatory Visit: Payer: No Typology Code available for payment source | Admitting: Dietician

## 2014-05-30 ENCOUNTER — Encounter: Payer: Self-pay | Admitting: Internal Medicine

## 2014-05-30 ENCOUNTER — Ambulatory Visit (INDEPENDENT_AMBULATORY_CARE_PROVIDER_SITE_OTHER): Payer: No Typology Code available for payment source | Admitting: Internal Medicine

## 2014-05-30 VITALS — BP 120/65 | HR 90 | Temp 98.4°F | Ht 69.0 in | Wt 189.9 lb

## 2014-05-30 DIAGNOSIS — Z0000 Encounter for general adult medical examination without abnormal findings: Secondary | ICD-10-CM

## 2014-05-30 DIAGNOSIS — R011 Cardiac murmur, unspecified: Secondary | ICD-10-CM

## 2014-05-30 DIAGNOSIS — I10 Essential (primary) hypertension: Secondary | ICD-10-CM

## 2014-05-30 NOTE — Assessment & Plan Note (Signed)
Patient states that she would like to get her colonoscopy "next summer." Will readdress at that time. Patient declined flu vaccine.

## 2014-05-30 NOTE — Assessment & Plan Note (Signed)
BP Readings from Last 3 Encounters:  05/30/14 120/65  05/03/14 123/76  04/25/14 134/76    Lab Results  Component Value Date   NA 136 05/03/2014   K 4.2 05/03/2014   CREATININE 0.93 05/03/2014    Assessment: Blood pressure control: controlled Progress toward BP goal:  at goal Comments:   Plan: Medications:  continue current medications Educational resources provided:   Self management tools provided:   Other plans: BP at goal. Continue Lisinopril 10 mg daily and Maxzde 37.5-25 mg daily.

## 2014-05-30 NOTE — Progress Notes (Signed)
   Subjective:    Patient ID: Cassandra Park, female    DOB: 11/16/1960, 53 y.o.   MRN: [id number]  HPI Cassandra Park is a 53yo woman w/ PMHx of HTN and HLD who presents today for follow-up of her newly diagnosed heart murmur. Patient was found to have a heart murmur on her 55/11 visit. Patient denied previously being told that she had a heart murmur. Further work up was not pursued at that time because patient was asymptomatic. She came back to the clinic on 8/18 because she was worried about the murmur. An echocardiogram was done on 8/21 that showed an EF 65-70%, no wall motion abnormalities, grade I diastolic dysfunction. Patient denies symptoms of chest pain, SOB, palpitations, leg swelling, and orthopnea.    Review of Systems General: Denies fever, chills, night sweats, changes in weight, changes in appetite HEENT: Denies headaches, ear pain, changes in vision, rhinorrhea, sore throat CV: See HPI Pulm: Denies cough, wheezing GI: Denies abdominal pain, nausea, vomiting, diarrhea, constipation, melena, hematochezia GU: Denies dysuria, hematuria, frequency Msk: Denies muscle cramps, joint pains Neuro: Denies weakness, numbness, tingling Skin: Denies rashes, bruising    Objective:   Physical Exam General: appears stated age, sitting up in chair, NAD HEENT: [place]/AT, EOMI, PERRL, sclera anicteric, pharynx non-erythematous, mucus membranes moist Neck: supple, no JVD, no lymphadenopathy CV: RRR, normal P9/X5, 2/6 systolic murmur heard best at 2nd right intercostal space, radiating to carotids Pulm: CTA bilaterally, breaths non-labored, no wheezing Abd: BS+, soft, non-distended, non-tender Ext: warm, no edema, moves all Neuro: alert and oriented x 3, CNs II-XII intact, strength 5/5 in upper and lower extremities bilaterally    Assessment & Plan:

## 2014-05-30 NOTE — Assessment & Plan Note (Signed)
Echocardiogram on 8/21 showed EF 65-70%, no wall motion abnormalities, grade I diastolic dysfunction. Patient reassured that heart function is normal and that she does not need to worry about her newly diagnosed heart murmur. I expressed to her that in the future if she experiences chest pain, she should immediately go to the ED. Patient understands.

## 2014-05-30 NOTE — Patient Instructions (Signed)
It was a pleasure seeing you today, Ms. Mukherjee.  1. Heart murmur - Echocardiogram was normal  2. Colonoscopy - Referral made to Gastroenterology. They will contact you for an appointment date/time.  General Instructions:   Thank you for bringing your medicines today. This helps [place] keep you safe from mistakes.   Progress Toward Treatment Goals:  Treatment Goal 04/25/2014  Blood pressure at goal  Stop smoking -    Self Care Goals & Plans:  Self Care Goal 05/03/2014  Manage my medications take my medicines as prescribed; bring my medications to every visit; refill my medications on time  Monitor my health -  Eat healthy foods eat foods that are low in salt  Be physically active find an activity I enjoy  Stop smoking -    No flowsheet data found.   Care Management & Community Referrals:  Referral 04/25/2014  Referrals made for care management support nutritionist

## 2014-06-05 NOTE — Progress Notes (Signed)
Internal Medicine Clinic Attending  I saw and evaluated the patient.  I personally confirmed the key portions of the history and exam documented by Dr. Rivet and I reviewed pertinent patient test results.  The assessment, diagnosis, and plan were formulated together and I agree with the documentation in the resident's note. 

## 2014-06-06 ENCOUNTER — Encounter: Payer: No Typology Code available for payment source | Admitting: Dietician

## 2014-07-12 ENCOUNTER — Encounter: Payer: Self-pay | Admitting: Internal Medicine

## 2014-07-12 ENCOUNTER — Ambulatory Visit (INDEPENDENT_AMBULATORY_CARE_PROVIDER_SITE_OTHER): Payer: Self-pay | Admitting: Internal Medicine

## 2014-07-12 ENCOUNTER — Ambulatory Visit: Payer: No Typology Code available for payment source

## 2014-07-12 VITALS — BP 126/70 | HR 92 | Temp 97.9°F | Ht 69.0 in | Wt 191.6 lb

## 2014-07-12 DIAGNOSIS — N760 Acute vaginitis: Secondary | ICD-10-CM

## 2014-07-12 DIAGNOSIS — Z0000 Encounter for general adult medical examination without abnormal findings: Secondary | ICD-10-CM

## 2014-07-12 DIAGNOSIS — B9689 Other specified bacterial agents as the cause of diseases classified elsewhere: Secondary | ICD-10-CM

## 2014-07-12 DIAGNOSIS — I10 Essential (primary) hypertension: Secondary | ICD-10-CM

## 2014-07-12 DIAGNOSIS — Z124 Encounter for screening for malignant neoplasm of cervix: Secondary | ICD-10-CM

## 2014-07-12 NOTE — Assessment & Plan Note (Addendum)
BP Readings from Last 3 Encounters:  07/12/14 126/70  05/30/14 120/65  05/03/14 123/76   Lab Results  Component Value Date   NA 136 05/03/2014   K 4.2 05/03/2014   CREATININE 0.93 05/03/2014   Assessment: Blood pressure control: controlled Progress toward BP goal:  at goal Comments: improved on repeat  Plan: Medications:  continue current medications maxzide and lisinopril

## 2014-07-12 NOTE — Assessment & Plan Note (Addendum)
papsmear done today. Noted prior history of HPV CIN1 but most recent papsmear has been negative for malignancy.   -sent pap for HPV retesting along with gonorrhea, chlamydia, gardnerella, candida, and trichomonas. She is having some discharge and suprapubic discomfort -given her history of abnormal papsmears, I would consider GYN referral as well, apparently has been referred in the past but not sure if recently. She needs orange card first prior to referral.  -refused flu vaccine today -needs colonoscopy--referral next visit if orange card in place  Addendum 07/14/14: papsmear positive for BV. Will prescribe flagyl 500mg  po bid x7 days

## 2014-07-12 NOTE — Progress Notes (Signed)
Subjective:   Patient ID: Cassandra Park female   DOB: [birth year] 53 y.o.   MRN: [id number]  HPI: [name] Probus is a 53 y.o. G4P3 female with PMH as listed below presenting to [facility] today for papsmear.   Claims last papsmear 2 years ago, epic report 03/2012 negative for intraepithelial lesions or malignancy. Noted cytology report in 2005 for high risk HPV detected with low grade squamous intraepithelial lesion CIN-1/VAIN-1/HPV noted. 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 Denies family history of breast or cervical cancer. Currently sexually active, last encounter 3 weeks ago, does not always use protection and did not most recently. Would like for other testing including Gonorrhea, chlamydia, and wetprep to be done as well.   Only complaint with clear white discharge that has started since Monday. Denies itching or foul odor of discharge. No bleeding. Also endorses some mild suprapubic discomfort this week that feels like her period is going to start but does not as she has not had a menstrual cycle for at least 5 years.   Past Medical History  Diagnosis Date  . Vaginal bleeding, abnormal June 2010    Thought to be 2/2 DUB vs endometrial CA. Gyn referral made at that time.  . Allergic rhinitis   . HTN (hypertension)   . Hyperlipidemia   . Osteoarthritis     Left hip  . Substance abuse     tobacco, alcohol  . Keratoconus     f/u @ [facility] eye center. Not needing corneal transplant.   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 2 pap smears since then normal. [facility]   Current Outpatient Prescriptions  Medication Sig Dispense Refill  . aspirin 81 MG tablet Take 81 mg by mouth daily.      . calcium-vitamin D (OSCAL WITH D 500-200) 500-200 MG-UNIT per tablet Take 1 tablet by mouth 2 (two) times daily.        Marland Kitchen lisinopril (ZESTRIL) 10 MG tablet Take 1 tablet (10 mg total) by mouth daily.  30 tablet  5  . lovastatin (MEVACOR) 20 MG  tablet TAKE ONE TABLET BY MOUTH ONCE DAILY  30 tablet  5  . triamterene-hydrochlorothiazide (MAXZIDE-25) 37.5-25 MG per tablet TAKE ONE TABLET BY MOUTH ONCE DAILY  30 tablet  11   No current facility-administered medications for this visit.   Family History  Problem Relation Age of Onset  . Appendicitis Father     deceased   History   Social History  . Marital Status: Single    Spouse Name: N/A    Number of Children: N/A  . Years of Education: N/A   Social History Main Topics  . Smoking status: Former Smoker -- 0.50 packs/day    Types: Cigarettes  . Smokeless tobacco: Former Systems developer    Quit date: 03/29/2013     Comment: already quit on 03/29/13   . Alcohol Use: Yes     Comment: occasionally  . Drug Use: No  . Sexual Activity: None   Other Topics Concern  . 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 Occasional suprapubic discomfort lately  Musculoskeletal:  Denies gait problem.   Skin:  Denies pallor, rash and wound.   Neurological:  Denies headaches.    Objective:  Physical Exam: Filed Vitals:   07/12/14 0823 07/12/14 0857  BP: 150/60 126/70  Pulse: 106 92  Temp: 97.9 F (36.6 C)   TempSrc: Oral   Height: 5\' 9"  (1.753 m)   Weight: 191 lb 9.6 oz (86.909 kg)   SpO2: 100%    Vitals reviewed. General: sitting in chair, NAD HEENT: EOMI Cardiac: Tachycardia, +3/6 SEM loudest LUSB Pulm: clear to auscultation bilaterally, no wheezes, rales, or rhonchi Abd: soft, nontender, nondistended, BS present, +stretch marks GYN: no cervical  motion tenderness or obvious masses palpated. Cervix 12'o clock position white patch with no bleeding ?scarring Ext: warm and well perfused, moving all 4 extremities Neuro: alert and oriented X3  Assessment & Plan:  Discussed with Dr. Ellwood Dense

## 2014-07-12 NOTE — Patient Instructions (Addendum)
General Instructions:  Thank you for bringing your medicines today. This helps [place] keep you safe from mistakes.   Follow up with your primary care doctor for next appointment in March as she recommended last month  Please consider getting flu vaccine  Progress Toward Treatment Goals:  Treatment Goal 07/12/2014  Blood pressure deteriorated  Stop smoking -    Self Care Goals & Plans:  Self Care Goal 07/12/2014  Manage my medications take my medicines as prescribed; bring my medications to every visit  Monitor my health -  Eat healthy foods eat more vegetables  Be physically active find an activity I enjoy  Stop smoking -    No flowsheet data found.   Care Management & Community Referrals:  Referral 04/25/2014  Referrals made for care management support nutritionist

## 2014-07-13 LAB — CYTOLOGY - PAP

## 2014-07-14 LAB — CERVICOVAGINAL ANCILLARY ONLY: CANDIDA VAGINITIS: NEGATIVE

## 2014-07-14 MED ORDER — METRONIDAZOLE 500 MG PO TABS: 500.0000 mg | ORAL_TABLET | Freq: Two times a day (BID) | ORAL | Status: AC

## 2014-07-14 NOTE — Addendum Note (Signed)
Addended by: Wilber Oliphant on: 07/14/2014 11:59 PM   Modules accepted: Orders

## 2014-07-14 NOTE — Progress Notes (Signed)
Internal Medicine Clinic Attending  Case discussed with Dr. Qureshi soon after the resident saw the patient.  We reviewed the resident's history and exam and pertinent patient test results.  I agree with the assessment, diagnosis, and plan of care documented in the resident's note. 

## 2014-07-17 ENCOUNTER — Telehealth: Payer: Self-pay | Admitting: Internal Medicine

## 2014-07-17 NOTE — Telephone Encounter (Signed)
I called Cassandra Park to review results of her papsmear and BV. She voiced understanding. Flagyl prescribed to her [facility] pharmacy that she will pick up.

## 2014-07-25 ENCOUNTER — Ambulatory Visit: Payer: Self-pay

## 2014-08-07 ENCOUNTER — Ambulatory Visit: Payer: Self-pay

## 2014-08-09 ENCOUNTER — Ambulatory Visit: Payer: Self-pay

## 2014-09-05 ENCOUNTER — Other Ambulatory Visit: Payer: Self-pay | Admitting: *Deleted

## 2014-09-05 MED ORDER — LISINOPRIL 10 MG PO TABS: 10.0000 mg | ORAL_TABLET | Freq: Every day | ORAL | Status: DC

## 2014-09-06 NOTE — Telephone Encounter (Signed)
Lisinopril rx called to [place] per pt's request.

## 2014-11-03 ENCOUNTER — Telehealth: Payer: Self-pay | Admitting: *Deleted

## 2014-11-03 NOTE — Telephone Encounter (Signed)
Please ask her to take Ibuprofen 400 mg 3 *day as needed for her pain. If it worsens or doesn't improve please ask her to follow up in the clinic

## 2014-11-03 NOTE — Telephone Encounter (Signed)
Pt informed and voices understanding 

## 2014-11-03 NOTE — Telephone Encounter (Signed)
Pt called with c/o swelling to rt knee.  No known injury but stands on cement floor at work.  She has not taken any pain meds.  Onset 3 - 4 days ago. Rates pain 3/10.  On and off. She wants to know if she can take IBU for this. How much? Pt # [id number]

## 2014-11-09 ENCOUNTER — Ambulatory Visit (INDEPENDENT_AMBULATORY_CARE_PROVIDER_SITE_OTHER): Payer: Self-pay | Admitting: Internal Medicine

## 2014-11-09 VITALS — BP 141/67 | HR 88 | Temp 98.2°F | Wt 194.8 lb

## 2014-11-09 DIAGNOSIS — M25462 Effusion, left knee: Secondary | ICD-10-CM | POA: Insufficient documentation

## 2014-11-09 NOTE — Assessment & Plan Note (Signed)
This is likely secondary to osteoarthritis. Patient has had imaging of the left knee in 2006 that confirmed osteoarthritis at that time. Less likely to be septic joint as patient has no other risk factors or signs or symptoms. Gout also seems less likely. Swelling has already begun to diminish and pt related symptoms to over use. Pt having no pain.  -discussion around sports medicine or orthopedics referral was had but pt is not interested in pursing at this time -continue symptomatic management with support compression brace  -f/u prn

## 2014-11-09 NOTE — Patient Instructions (Signed)
General Instructions:   Please try to bring all your medicines next time. This will help [place] keep you safe from mistakes.   Progress Toward Treatment Goals:  Treatment Goal 07/12/2014  Blood pressure at goal  Stop smoking -    Self Care Goals & Plans:  Self Care Goal 11/09/2014  Manage my medications take my medicines as prescribed; bring my medications to every visit; refill my medications on time  Monitor my health keep track of my blood pressure  Eat healthy foods drink diet soda or water instead of juice or soda; eat more vegetables; eat foods that are low in salt; eat baked foods instead of fried foods  Be physically active -  Stop smoking -    No flowsheet data found.   Care Management & Community Referrals:  Referral 04/25/2014  Referrals made for care management support nutritionist       Osteoarthritis Osteoarthritis is a disease that causes soreness and inflammation of a joint. It occurs when the cartilage at the affected joint wears down. Cartilage acts as a cushion, covering the ends of bones where they meet to form a joint. Osteoarthritis is the most common form of arthritis. It often occurs in older people. The joints affected most often by this condition include those in the:  Ends of the fingers.  Thumbs.  Neck.  Lower back.  Knees.  Hips. CAUSES  Over time, the cartilage that covers the ends of bones begins to wear away. This causes bone to rub on bone, producing pain and stiffness in the affected joints.  RISK FACTORS Certain factors can increase your chances of having osteoarthritis, including:  Older age.  Excessive body weight.  Overuse of joints.  Previous joint injury. SIGNS AND SYMPTOMS   Pain, swelling, and stiffness in the joint.  Over time, the joint may lose its normal shape.  Small deposits of bone (osteophytes) may grow on the edges of the joint.  Bits of bone or cartilage can break off and float inside the joint space.  This may cause more pain and damage. DIAGNOSIS  Your health care provider will do a physical exam and ask about your symptoms. Various tests may be ordered, such as:  X-rays of the affected joint.  An MRI scan.  Blood tests to rule out other types of arthritis.  Joint fluid tests. This involves using a needle to draw fluid from the joint and examining the fluid under a microscope. TREATMENT  Goals of treatment are to control pain and improve joint function. Treatment plans may include:  A prescribed exercise program that allows for rest and joint relief.  A weight control plan.  Pain relief techniques, such as:  Properly applied heat and cold.  Electric pulses delivered to nerve endings under the skin (transcutaneous electrical nerve stimulation [TENS]).  Massage.  Certain nutritional supplements.  Medicines to control pain, such as:  Acetaminophen.  Nonsteroidal anti-inflammatory drugs (NSAIDs), such as naproxen.  Narcotic or central-acting agents, such as tramadol.  Corticosteroids. These can be given orally or as an injection.  Surgery to reposition the bones and relieve pain (osteotomy) or to remove loose pieces of bone and cartilage. Joint replacement may be needed in advanced states of osteoarthritis. HOME CARE INSTRUCTIONS   Take medicines only as directed by your health care provider.  Maintain a healthy weight. Follow your health care provider's instructions for weight control. This may include dietary instructions.  Exercise as directed. Your health care provider can recommend specific types of  exercise. These may include:  Strengthening exercises. These are done to strengthen the muscles that support joints affected by arthritis. They can be performed with weights or with exercise bands to add resistance.  Aerobic activities. These are exercises, such as brisk walking or low-impact aerobics, that get your heart pumping.  Range-of-motion activities. These  keep your joints limber.  Balance and agility exercises. These help you maintain daily living skills.  Rest your affected joints as directed by your health care provider.  Keep all follow-up visits as directed by your health care provider. SEEK MEDICAL CARE IF:   Your skin turns red.  You develop a rash in addition to your joint pain.  You have worsening joint pain.  You have a fever along with joint or muscle aches. SEEK IMMEDIATE MEDICAL CARE IF:  You have a significant loss of weight or appetite.  You have night sweats. [place] of Arthritis and Musculoskeletal and Skin Diseases: www.niams.SouthExposed.es  [employer] on Aging: http://kim-miller.com/  American College of Rheumatology: www.rheumatology.org Document Released: 09/01/2005 Document Revised: 01/16/2014 Document Reviewed: 05/09/2013 [facility] Patient Information 2015 [place], [place]. This information is not intended to replace advice given to you by your health care provider. Make sure you discuss any questions you have with your health care provider.

## 2014-11-09 NOTE — Progress Notes (Signed)
Subjective:   Patient ID: Cassandra Park female   DOB: 31-Aug-1961 54 y.o.   MRN: [id number]  HPI: [name] is a 54 y.o. woman pmh as listed below presents for left knee swelling.  Pt states this has been going on for about 2-3 days around the same time she has noticed she has been standing more often at her job. She denies any trauma to the area, any erythema, or warmth she has put a compression brace on the knee that has helped with the swelling along with ibuprofen. She has had no new sexual partners, rashes, fevers or chills, nausea vomiting or diarrhea, weakness, paresthesias, or trouble ambulating. The patient wanted some reassurance that her condition was benign. She has a hx of OA in that knee.    Past Medical History  Diagnosis Date  . Vaginal bleeding, abnormal June 2010    Thought to be 2/2 DUB vs endometrial CA. Gyn referral made at that time.  . Allergic rhinitis   . HTN (hypertension)   . Hyperlipidemia   . Osteoarthritis     Left hip  . Substance abuse     tobacco, alcohol  . Keratoconus     f/u @ [facility] eye center. Not needing corneal transplant.   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 2 pap smears since then normal. [facility]   Current Outpatient Prescriptions  Medication Sig Dispense Refill  . aspirin 81 MG tablet Take 81 mg by mouth daily.    . calcium-vitamin D (OSCAL WITH D 500-200) 500-200 MG-UNIT per tablet Take 1 tablet by mouth 2 (two) times daily.      Marland Kitchen lisinopril (ZESTRIL) 10 MG tablet Take 1 tablet (10 mg total) by mouth daily. 90 tablet 3  . lovastatin (MEVACOR) 20 MG tablet TAKE ONE TABLET BY MOUTH ONCE DAILY 30 tablet 5  . triamterene-hydrochlorothiazide (MAXZIDE-25) 37.5-25 MG per tablet TAKE ONE TABLET BY MOUTH ONCE DAILY 30 tablet 11   No current facility-administered medications for this visit.   Family History  Problem Relation Age of Onset  . Appendicitis Father     deceased   History    Social History  . Marital Status: Single    Spouse Name: N/A  . Number of Children: N/A  . Years of Education: N/A   Social History Main Topics  . Smoking status: Former Smoker -- 0.50 packs/day    Types: Cigarettes  . Smokeless tobacco: Former Systems developer    Quit date: 03/29/2013     Comment: already quit on 03/29/13   . Alcohol Use: Yes     Comment: occasionally  . Drug Use: No  . Sexual Activity: Not on file   Other Topics Concern  . 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 card, [employer] September 24, 2010 4.31pm            Review of Systems: Pertinent items are noted in HPI. Objective:  Physical Exam: Filed Vitals:   11/09/14 1404  BP: 141/67  Pulse: 88  Temp: 98.2 F (36.8 C)  TempSrc: Oral  Weight: 194 lb 12.8 oz (88.361 kg)  SpO2: 98%   General: sitting in chair, NAD  Cardiac: RRR, no rubs, murmurs or gallops  Pulm: clear to auscultation bilaterally, moving normal volumes of air Ext: warm and well perfused, no pedal edema MSK: left knee with some slight diffuse swelling, no ttp no erythema or warmth, full range of motion, 2+ DTRs, 5 out of 5 lower extremity strength bilaterally, normal sensation, normal gait  Assessment & Plan:  Please see problem oriented charting  Pt discussed with Dr. Daryll Drown

## 2014-11-14 NOTE — Progress Notes (Signed)
Internal Medicine Clinic Attending  Case discussed with Dr. Sadek soon after the resident saw the patient.  We reviewed the resident's history and exam and pertinent patient test results.  I agree with the assessment, diagnosis, and plan of care documented in the resident's note. 

## 2014-11-14 NOTE — Addendum Note (Signed)
Addended by: Gilles Chiquito B on: 11/14/2014 10:21 AM   Modules accepted: Level of Service

## 2014-12-25 ENCOUNTER — Other Ambulatory Visit: Payer: Self-pay | Admitting: Internal Medicine

## 2015-01-04 ENCOUNTER — Ambulatory Visit (INDEPENDENT_AMBULATORY_CARE_PROVIDER_SITE_OTHER): Payer: Self-pay | Admitting: Internal Medicine

## 2015-01-04 ENCOUNTER — Encounter: Payer: Self-pay | Admitting: Internal Medicine

## 2015-01-04 VITALS — BP 154/68 | HR 110 | Temp 98.1°F | Ht 69.0 in | Wt 200.8 lb

## 2015-01-04 DIAGNOSIS — Z0000 Encounter for general adult medical examination without abnormal findings: Secondary | ICD-10-CM

## 2015-01-04 DIAGNOSIS — R1030 Lower abdominal pain, unspecified: Secondary | ICD-10-CM

## 2015-01-04 DIAGNOSIS — M7918 Myalgia, other site: Secondary | ICD-10-CM | POA: Insufficient documentation

## 2015-01-04 DIAGNOSIS — I10 Essential (primary) hypertension: Secondary | ICD-10-CM

## 2015-01-04 DIAGNOSIS — R109 Unspecified abdominal pain: Secondary | ICD-10-CM

## 2015-01-04 DIAGNOSIS — R1011 Right upper quadrant pain: Secondary | ICD-10-CM

## 2015-01-04 LAB — POCT URINALYSIS DIPSTICK
Bilirubin, UA: NEGATIVE
Blood, UA: NEGATIVE
Glucose, UA: NEGATIVE
KETONES UA: NEGATIVE
NITRITE UA: NEGATIVE
PH UA: 6.5
PROTEIN UA: NEGATIVE
SPEC GRAV UA: 1.01
UROBILINOGEN UA: 0.2

## 2015-01-04 NOTE — Assessment & Plan Note (Signed)
Patient with right flank pain that radiates to the bilateral lower abdomen for the past 2 weeks. Pain is 2/3 in intensity, intermittent, achy, and associated with constipation. Patient recently started eating yogurt and noticed changes in her bowels since that time. She notes straining during bowel movements and hard stools. She tried a suppository with relief noted. Bowel movements seem to be normal now and pain improving. UA was negative for UTI. Kidney stones unlikely as would have significantly more pain and likely would have blood on urine dipstick. Her flank pain/abdominal pain is likely due to constipation.  - Recommended to try OTC stool softeners/laxatives if still having to strain (Miralax, Dulcolax, Colace) - Instructed to drink plenty of water - Ibuoprofen or Tylenol PRN pain

## 2015-01-04 NOTE — Assessment & Plan Note (Addendum)
BP Readings from Last 3 Encounters:  01/04/15 154/68  11/09/14 141/67  07/12/14 126/70    Lab Results  Component Value Date   NA 136 05/03/2014   K 4.2 05/03/2014   CREATININE 0.93 05/03/2014    Assessment: Blood pressure control: mildly elevated Progress toward BP goal:  deteriorated  Comments: BP slightly elevated in setting of acute abdominal pain.   Plan: Medications:  continue current medications Other plans:  - Will not adjust medications as her elevated BP is likely secondary to pain - Continue Lisinopril 10 mg daily and Maxzide 37.5-25 mg daily  - BP recheck in 2 months

## 2015-01-04 NOTE — Patient Instructions (Addendum)
It was a pleasure seeing you today, Cassandra Park.  1. Abdominal pain - Likely due to constipation - Can try over the counter laxatives if still straining with bowel movements - ibuprofen or tylenol for pain  2. Colonoscopy - Referral made to GI, please schedule for June or July (whichever is more convenient for you)  3. High blood pressure - Continue taking Lisinopril 10 mg daily - Blood pressure recheck in 2 months  General Instructions:   Please bring your medicines with you each time you come to clinic.  Medicines may include prescription medications, over-the-counter medications, herbal remedies, eye drops, vitamins, or other pills.   Progress Toward Treatment Goals:  Treatment Goal 07/12/2014  Blood pressure at goal  Stop smoking -    Self Care Goals & Plans:  Self Care Goal 01/04/2015  Manage my medications bring my medications to every visit; refill my medications on time; take my medicines as prescribed  Monitor my health keep track of my blood pressure  Eat healthy foods eat more vegetables; eat foods that are low in salt; eat baked foods instead of fried foods  Be physically active find an activity I enjoy  Stop smoking -    No flowsheet data found.   Care Management & Community Referrals:  Referral 04/25/2014  Referrals made for care management support nutritionist

## 2015-01-04 NOTE — Progress Notes (Signed)
Internal Medicine Clinic Attending  Case discussed with Dr. Rivet at the time of the visit.  We reviewed the resident's history and exam and pertinent patient test results.  I agree with the assessment, diagnosis, and plan of care documented in the resident's note.  

## 2015-01-04 NOTE — Progress Notes (Signed)
   Subjective:    Patient ID: Cassandra Park, female    DOB: 1961-04-14, 54 y.o.   MRN: [id number]  HPI Cassandra Park is a 54yo woman with PMHx of HTN, hyperlipidemia, and OA who presents today for right sided flank pain that radiates to her bilateral lower abdomen. She states the pain started about 2 weeks ago. She describes the pain as 2-3/10 in severity, achy, intermittent, and does not change with position. She notes that she started eating yogurt daily about 1 month ago and previously never ate yogurt. She also notes picking up a large heavy case of water about 2 weeks ago. She states she felt the pain initially after picking up the box and then the following day. She reports constipation, but denies fever, chills, nausea, vomiting, diarrhea, dysuria, hematuria, melena, and hematochezia. She has not taken any OTC medications to alleviate the pain. She states she used a suppository for her constipation, which has helped. She notes having to strain a lot for the past 2 weeks. She denies any blood with wiping. Her last BM was this morning and was normal. Previously BM's were "hard balls of stool." She states overall the pain is improving.    Review of Systems General: Denies night sweats, changes in weight, changes in appetite HEENT: Denies headaches, ear pain, changes in vision, rhinorrhea, sore throat CV: Denies CP, palpitations, SOB, orthopnea Pulm: Denies SOB, cough, wheezing GI: See above  GU: See above  Msk: Denies muscle cramps, joint pains Neuro: Denies weakness, numbness, tingling Skin: Denies rashes, bruising    Objective:   Physical Exam General: sitting up in chair, NAD  HEENT: [place]/AT, EOMI, sclera anicteric, mucus membranes moist CV: tachycardic in 827M, 2/6 systolic murmur heard best at LUSB Pulm: CTA bilaterally, breaths non-labored Abd: BS+, soft, non-tender, non-distended. No costovertebral tenderness noted.  Ext: warm, no edema, moves all Neuro: alert and oriented x 3, no  focal deficits     Assessment & Plan:  Please refer to A&P documentation.

## 2015-01-04 NOTE — Assessment & Plan Note (Signed)
Referral placed for colonoscopy but [facility] not covering for orange card patients this month - Place referral at next visit

## 2015-01-25 ENCOUNTER — Other Ambulatory Visit: Payer: Self-pay | Admitting: Internal Medicine

## 2015-02-06 ENCOUNTER — Telehealth: Payer: Self-pay | Admitting: Internal Medicine

## 2015-02-06 NOTE — Telephone Encounter (Signed)
Call to patient to confirm appointment for 02/07/15 at 9:30 lmtcb

## 2015-02-07 ENCOUNTER — Ambulatory Visit: Payer: Self-pay

## 2015-02-21 ENCOUNTER — Ambulatory Visit (INDEPENDENT_AMBULATORY_CARE_PROVIDER_SITE_OTHER): Payer: Self-pay | Admitting: Internal Medicine

## 2015-02-21 ENCOUNTER — Encounter: Payer: Self-pay | Admitting: Internal Medicine

## 2015-02-21 VITALS — BP 145/66 | HR 109 | Temp 97.4°F | Ht 69.0 in | Wt 195.6 lb

## 2015-02-21 DIAGNOSIS — R220 Localized swelling, mass and lump, head: Secondary | ICD-10-CM

## 2015-02-21 DIAGNOSIS — K130 Diseases of lips: Secondary | ICD-10-CM

## 2015-02-21 DIAGNOSIS — I10 Essential (primary) hypertension: Secondary | ICD-10-CM

## 2015-02-21 MED ORDER — AMLODIPINE BESYLATE 5 MG PO TABS: 5.0000 mg | ORAL_TABLET | Freq: Every day | ORAL | Status: DC

## 2015-02-21 NOTE — Patient Instructions (Signed)
1. Please schedule a follow up for 1 week. If the swelling gets worse, PLEASE COME TO THE EMERGENCY ROOM.   2. Please take all medications as previously prescribed with the following changes:  STOP TAKING LISINOPRIL. Start taking Norvasc 5 mg daily for blood pressure instead of Lisinopril.   3. If you have worsening of your symptoms or new symptoms arise, please call the clinic ([phone]), or go to the ER immediately if symptoms are severe.  You have done a great job in taking all your medications. Please continue to do this.

## 2015-02-21 NOTE — Progress Notes (Signed)
Subjective:   Patient ID: Cassandra Park female   DOB: 17-Dec-1960 54 y.o.   MRN: [id number]  HPI: [name] is a 54 y.o. female w/ PMHx of HTN, HLD, OA, and h/o DUB, presents to the clinic today for an acute visit for lip swelling. Patient states she noticed her right lower lip started to swell at work today. She denies eating anything different, trauma, bite or sting. Patient states her colleagues started to notice that it was getting more swollen over the period of about an hour. She denies any numbness, tingling, pain, or pruritis. She states she has never had this before. She also notes that it was only the right side of her bottom lip. She has been using ice on it for the past 2 hours. She also notes that it is not changed or grown much over the past 30 minutes to 1 hour. Patient does take an ACEI which she has taken for 3 years or so. She denies any swelling of her tongue or any other part of her mouth. No difficulty breathing.   Past Medical History  Diagnosis Date  . Vaginal bleeding, abnormal June 2010    Thought to be 2/2 DUB vs endometrial CA. Gyn referral made at that time.  . Allergic rhinitis   . HTN (hypertension)   . Hyperlipidemia   . Osteoarthritis     Left hip  . Substance abuse     tobacco, alcohol  . Keratoconus     f/u @ [facility]. Not needing corneal transplant.   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 2 pap smears since then normal. [facility]   Current Outpatient Prescriptions  Medication Sig Dispense Refill  . aspirin 81 MG tablet Take 81 mg by mouth daily.    . calcium-vitamin D (OSCAL WITH D 500-200) 500-200 MG-UNIT per tablet Take 1 tablet by mouth 2 (two) times daily.      Marland Kitchen lisinopril (ZESTRIL) 10 MG tablet Take 1 tablet (10 mg total) by mouth daily. 90 tablet 3  . lovastatin (MEVACOR) 20 MG tablet TAKE ONE TABLET BY MOUTH ONCE DAILY 30 tablet 5  . triamterene-hydrochlorothiazide (MAXZIDE-25)  37.5-25 MG per tablet TAKE ONE TABLET BY MOUTH ONCE DAILY 30 tablet 11   No current facility-administered medications for this visit.   Review of Systems  General: Positive for lower lip swelling. Denies fever, diaphoresis, appetite change, and fatigue.  Respiratory: Denies SOB, cough, and wheezing.   Cardiovascular: Denies chest pain and palpitations.  Gastrointestinal: Denies nausea, vomiting, abdominal pain, and diarrhea Musculoskeletal: Denies myalgias, arthralgias, back pain, and gait problem.  Neurological: Denies dizziness, syncope, weakness, lightheadedness, and headaches.  Psychiatric/Behavioral: Denies mood changes, sleep disturbance, and agitation.   Objective:   Physical Exam: Filed Vitals:   02/21/15 1507  BP: 145/66  Pulse: 109  Temp: 97.4 F (36.3 C)  TempSrc: Oral  Height: 5\' 9"  (1.753 m)  Weight: 195 lb 9.6 oz (88.724 kg)  SpO2: 99%    General: AA female, alert, cooperative, NAD. HEENT: PERRL, EOMI. Moist mucus membranes. Left eye w/ corneal clouding. Right 2/3 of lower lip w/ significant swelling, also involving a small portion of the buccal mucosa. No swelling of tongue or upper lip. No erythema or discharge. No obvious signs of airway compromise.  Neck: Full range of motion without pain, supple, no lymphadenopathy or carotid bruits Lungs: Clear to ascultation bilaterally, normal  work of respiration, no wheezes, rales, rhonchi Heart: RRR, no murmurs, gallops, or rubs Abdomen: Soft, non-tender, non-distended, BS + Extremities: No cyanosis, clubbing, or edema Neurologic: Alert & oriented X3, cranial nerves II-XII intact, strength grossly intact, sensation intact to light touch   Assessment & Plan:   Please see problem based assessment and plan.

## 2015-02-22 NOTE — Progress Notes (Signed)
Medicine attending: Medical history, presenting problems, physical findings, and medications, reviewed with Dr Eden Jones and I concur with his evaluation and management plan. 

## 2015-02-22 NOTE — Assessment & Plan Note (Signed)
Patient w/ acute onset lower lip swelling over the past 2 hours. No known allergies, no recent abnormal foods. No overlying numbness, tingling, pain, or pruritis. Isolated to right 2/3 of bottom lip. She states she has never had this before. Given presentation in the setting of ACEI use, most likely angioedema. Patient has been taking ACEI for quite some time (3 years), however, can present later on in use of ACEI. No airway compromise. Patient says the swelling has not worsened over the past hour.  -Discontinue ACEI -Start Norvasc in its place -Benadryl, steroid do not serve purpose in bradykinin induced angioedema. -Discussed at length with patient, if swelling gets worse, has trouble breathing, she is to come to the ED immediately.   ADDENDUM: Called patient at home on 02/22/15, swelling significantly improved, almost completely resolved.

## 2015-02-22 NOTE — Assessment & Plan Note (Signed)
BP Readings from Last 3 Encounters:  02/21/15 145/66  01/04/15 154/68  11/09/14 141/67    Lab Results  Component Value Date   NA 136 05/03/2014   K 4.2 05/03/2014   CREATININE 0.93 05/03/2014    Assessment: Blood pressure control:  Mildly elevated.  Comments: Patient takes Lisinopril 10 mg daily + Tramterene-HCTZ 37.5-25 mg daily.  Plan: Medications:  Given likely angioedema 2/2 ACEI, will discontinue this at this time, start on Norvasc 5 mg daily in its place. May need increase at next clinic visit.  Educational resources provided: brochure, handout, video Other plans: Patient to return to clinic in 1 week for follow up

## 2015-02-27 ENCOUNTER — Ambulatory Visit (INDEPENDENT_AMBULATORY_CARE_PROVIDER_SITE_OTHER): Payer: Self-pay | Admitting: Internal Medicine

## 2015-02-27 ENCOUNTER — Encounter: Payer: Self-pay | Admitting: Internal Medicine

## 2015-02-27 VITALS — BP 123/66 | HR 79 | Temp 97.7°F | Ht 69.0 in | Wt 197.7 lb

## 2015-02-27 DIAGNOSIS — K5900 Constipation, unspecified: Secondary | ICD-10-CM | POA: Insufficient documentation

## 2015-02-27 DIAGNOSIS — K130 Diseases of lips: Secondary | ICD-10-CM

## 2015-02-27 DIAGNOSIS — Z0000 Encounter for general adult medical examination without abnormal findings: Secondary | ICD-10-CM

## 2015-02-27 DIAGNOSIS — R141 Gas pain: Secondary | ICD-10-CM

## 2015-02-27 DIAGNOSIS — E785 Hyperlipidemia, unspecified: Secondary | ICD-10-CM

## 2015-02-27 DIAGNOSIS — I10 Essential (primary) hypertension: Secondary | ICD-10-CM

## 2015-02-27 DIAGNOSIS — R220 Localized swelling, mass and lump, head: Secondary | ICD-10-CM

## 2015-02-27 MED ORDER — TRIAMTERENE-HCTZ 37.5-25 MG PO TABS: 1.0000 | ORAL_TABLET | Freq: Every day | ORAL | Status: DC

## 2015-02-27 NOTE — Assessment & Plan Note (Signed)
BP Readings from Last 3 Encounters:  02/27/15 123/66  02/21/15 145/66  01/04/15 154/68    Lab Results  Component Value Date   NA 136 05/03/2014   K 4.2 05/03/2014   CREATININE 0.93 05/03/2014    Assessment: Blood pressure control: controlled Progress toward BP goal:  at goal Comments: BP well controlled today.   Plan: Medications:  Continue Amlodipine 5 mg daily and Triamterene-HCTZ 37.5-25 mg daily.

## 2015-02-27 NOTE — Assessment & Plan Note (Signed)
Last lipid panel in June 2015 showed Chol 182, Trigly 119, HDL 76, and LDL 82.  - Lipid profile today - Continue Lovastatin 20 mg daily

## 2015-02-27 NOTE — Assessment & Plan Note (Signed)
Patient noting increased gas for past few weeks. She denies any changes in bowel habits, constipation, diarrhea or abdominal pain. She denies any recent changes in her diet.   - Suggested patient to try OTC Gas-X or Bean-O

## 2015-02-27 NOTE — Progress Notes (Signed)
   Subjective:    Patient ID: Cassandra Park, female    DOB: 1961/05/31, 54 y.o.   MRN: [id number]  HPI Cassandra Park is a 53yo woman with PMHx of HTN, osteoarthritis, and hyperlipidemia who presents today for a BP recheck.  Patient was seen on 6/8 after having lip angioedema from what was presumed to be her ACE-I. This medication was stopped and placed on her allergy list. She was also started on Amlodipine 5 mg daily. Today she reports the swelling resolved within 2 days and denies any other tongue, lip, or mouth swelling. She states she is able to eat normally with no difficulties. BP is very well controlled today with BP 123/66.    Review of Systems General: Denies fever, chills, night sweats, changes in weight, changes in appetite HEENT: Denies headaches, ear pain, changes in vision, rhinorrhea, sore throat CV: Denies CP, palpitations, SOB, orthopnea Pulm: Denies SOB, cough, wheezing GI: Denies nausea, vomiting, diarrhea, constipation, melena, hematochezia GU: Denies dysuria, hematuria, frequency Msk: Denies muscle cramps, joint pains Neuro: Denies weakness, numbness, tingling Skin: Denies rashes, bruising    Objective:   Physical Exam General: middle aged woman sitting up in chair, NAD HEENT: Cassandra Park/AT, EOMI, sclera anicteric. No lip, tongue or mouth swelling present.  CV: RRR, 2/6 systolic murmur heard best at LUSB Pulm: CTA bilaterally, breaths non-labored Abd: BS+, soft, non-tender, non-distended Ext: warm, no edema Neuro: alert and oriented x 3, no focal deficits     Assessment & Plan:  Please refer to A&P documentation.

## 2015-02-27 NOTE — Patient Instructions (Signed)
It was a pleasure taking care of you today, [name].  - Continue taking Amlodipine 5 mg daily and Maxzide 37.5-25 mg daily for your blood pressure - Blood work today for your electrolytes and cholesterol - I have placed a referral to gastroenterology to schedule a colonoscopy. You should hear from them in the next few weeks.   General Instructions:   Thank you for bringing your medicines today. This helps [place] keep you safe from mistakes.   Progress Toward Treatment Goals:  Treatment Goal 01/04/2015  Blood pressure deteriorated  Stop smoking -    Self Care Goals & Plans:  Self Care Goal 02/21/2015  Manage my medications take my medicines as prescribed; bring my medications to every visit; refill my medications on time; follow the sick day instructions if I am sick  Monitor my health -  Eat healthy foods eat more vegetables; eat fruit for snacks and desserts; eat foods that are low in salt; eat baked foods instead of fried foods; eat smaller portions  Be physically active find an activity I enjoy  Stop smoking -    No flowsheet data found.   Care Management & Community Referrals:  Referral 04/25/2014  Referrals made for care management support nutritionist

## 2015-02-27 NOTE — Assessment & Plan Note (Signed)
Referral placed to GI for colonoscopy.

## 2015-02-27 NOTE — Assessment & Plan Note (Signed)
Angioedema has completely resolved. Lisinopril on allergy list. Will continue Amlodipine and Triamterene-HCTZ for BP control.

## 2015-02-28 LAB — BASIC METABOLIC PANEL WITH GFR
BUN: 11 mg/dL (ref 6–23)
CHLORIDE: 99 meq/L (ref 96–112)
CO2: 28 mEq/L (ref 19–32)
Calcium: 9.5 mg/dL (ref 8.4–10.5)
Creat: 0.64 mg/dL (ref 0.50–1.10)
GFR, Est African American: >89 mL/min
GFR, Est Non African American: >89 mL/min
GLUCOSE: 115 mg/dL — AB (ref 70–99)
Potassium: 3.6 mEq/L (ref 3.5–5.3)
Sodium: 138 mEq/L (ref 135–145)

## 2015-02-28 LAB — LIPID PANEL
Cholesterol: 181 mg/dL (ref 0–200)
HDL: 80 mg/dL (ref >=46)
LDL CALC: 62 mg/dL (ref 0–99)
TRIGLYCERIDES: 193 mg/dL — AB (ref <150)
Total CHOL/HDL Ratio: 2.3 Ratio
VLDL: 39 mg/dL (ref 0–40)

## 2015-03-02 NOTE — Progress Notes (Signed)
Internal Medicine Clinic Attending  Case discussed with Dr. Rivet at the time of the visit.  We reviewed the resident's history and exam and pertinent patient test results.  I agree with the assessment, diagnosis, and plan of care documented in the resident's note.  

## 2015-03-06 ENCOUNTER — Encounter: Payer: Self-pay | Admitting: Internal Medicine

## 2015-03-07 NOTE — Addendum Note (Signed)
Addended by: Hulan Fray on: 03/07/2015 05:17 PM   Modules accepted: Orders

## 2015-03-22 ENCOUNTER — Other Ambulatory Visit (INDEPENDENT_AMBULATORY_CARE_PROVIDER_SITE_OTHER): Payer: Self-pay

## 2015-03-22 DIAGNOSIS — Z1211 Encounter for screening for malignant neoplasm of colon: Secondary | ICD-10-CM

## 2015-03-22 LAB — POC HEMOCCULT BLD/STL (HOME/3-CARD/SCREEN)
Card #2 Fecal Occult Blod, POC: NEGATIVE
FECAL OCCULT BLD: NEGATIVE
Fecal Occult Blood, POC: NEGATIVE

## 2015-04-05 ENCOUNTER — Other Ambulatory Visit (HOSPITAL_COMMUNITY): Payer: Self-pay

## 2015-04-05 ENCOUNTER — Other Ambulatory Visit: Payer: Self-pay | Admitting: Internal Medicine

## 2015-04-05 DIAGNOSIS — Z1231 Encounter for screening mammogram for malignant neoplasm of breast: Secondary | ICD-10-CM

## 2015-04-09 ENCOUNTER — Encounter: Payer: Self-pay | Admitting: Internal Medicine

## 2015-04-09 ENCOUNTER — Ambulatory Visit (INDEPENDENT_AMBULATORY_CARE_PROVIDER_SITE_OTHER): Payer: Self-pay | Admitting: Internal Medicine

## 2015-04-09 VITALS — BP 159/82 | HR 113 | Temp 97.5°F | Wt 188.8 lb

## 2015-04-09 DIAGNOSIS — I10 Essential (primary) hypertension: Secondary | ICD-10-CM

## 2015-04-09 DIAGNOSIS — R141 Gas pain: Secondary | ICD-10-CM

## 2015-04-09 MED ORDER — CARVEDILOL 3.125 MG PO TABS: 3.1250 mg | ORAL_TABLET | Freq: Every day | ORAL | Status: DC

## 2015-04-09 NOTE — Patient Instructions (Addendum)
Thank you for your visit.  For your gas and bloating, you can continue to use Gas-X.  Stop Tums use, this may be causing some of your bloating.  For your heartburn and bloating, start Omeprazole 20 mg once a day. You can use this for 4 weeks.  For your constipation, you can try Miralax or milk of magnesia.  For your blood pressure start a medication called Coreg 3.125 mg once a day. This will also help with your heart racing.  Please follow up in 1 month.    Constipation Constipation is when a person has fewer than three bowel movements a week, has difficulty having a bowel movement, or has stools that are dry, hard, or larger than normal. As people grow older, constipation is more common. If you try to fix constipation with medicines that make you have a bowel movement (laxatives), the problem may get worse. Long-term laxative use may cause the muscles of the colon to become weak. A low-fiber diet, not taking in enough fluids, and taking certain medicines may make constipation worse.  CAUSES   Certain medicines, such as antidepressants, pain medicine, iron supplements, antacids, and water pills.   Certain diseases, such as diabetes, irritable bowel syndrome (IBS), thyroid disease, or depression.   Not drinking enough water.   Not eating enough fiber-rich foods.   Stress or travel.   Lack of physical activity or exercise.   Ignoring the urge to have a bowel movement.   Using laxatives too much.  SIGNS AND SYMPTOMS   Having fewer than three bowel movements a week.   Straining to have a bowel movement.   Having stools that are hard, dry, or larger than normal.   Feeling full or bloated.   Pain in the lower abdomen.   Not feeling relief after having a bowel movement.  DIAGNOSIS  Your health care provider will take a medical history and perform a physical exam. Further testing may be done for severe constipation. Some tests may include:  A barium enema  X-ray to examine your rectum, colon, and, sometimes, your small intestine.   A sigmoidoscopy to examine your lower colon.   A colonoscopy to examine your entire colon. TREATMENT  Treatment will depend on the severity of your constipation and what is causing it. Some dietary treatments include drinking more fluids and eating more fiber-rich foods. Lifestyle treatments may include regular exercise. If these diet and lifestyle recommendations do not help, your health care provider may recommend taking over-the-counter laxative medicines to help you have bowel movements. Prescription medicines may be prescribed if over-the-counter medicines do not work.  HOME CARE INSTRUCTIONS   Eat foods that have a lot of fiber, such as fruits, vegetables, whole grains, and beans.  Limit foods high in fat and processed sugars, such as french fries, hamburgers, cookies, candies, and soda.   A fiber supplement may be added to your diet if you cannot get enough fiber from foods.   Drink enough fluids to keep your urine clear or pale yellow.   Exercise regularly or as directed by your health care provider.   Go to the restroom when you have the urge to go. Do not hold it.   Only take over-the-counter or prescription medicines as directed by your health care provider. Do not take other medicines for constipation without talking to your health care provider first.  [place] IF:   You have bright red blood in your stool.   Your constipation lasts for  more than 4 days or gets worse.   You have abdominal or rectal pain.   You have thin, pencil-like stools.   You have unexplained weight loss. MAKE SURE YOU:   Understand these instructions.  Will watch your condition.  Will get help right away if you are not doing well or get worse. Document Released: 05/30/2004 Document Revised: 09/06/2013 Document Reviewed: 06/13/2013 [facility] Patient Information 2015 [place], [place]. This  information is not intended to replace advice given to you by your health care provider. Make sure you discuss any questions you have with your health care provider.

## 2015-04-09 NOTE — Assessment & Plan Note (Signed)
Patient continues to have bloating and gassy pain. She is taking Tums daily and Gas-X every few days. She has constipation and occasional heartburn as well. She states that she is trying to avoid foods which increase her gas/bloating.  -Patient advised to stop Tums use as this can cause constipation, gas, and bloating and because she is already taking calcium as well. -Continue Gas-X as needed. -Trial of Omeprazole 20 mg daily for 4 weeks -For constipation she can continue bisacodyl or try OTC Miralax or milk of magnesia -Patient is given information about constipation

## 2015-04-09 NOTE — Assessment & Plan Note (Signed)
BP Readings from Last 3 Encounters:  04/09/15 159/82  02/27/15 123/66  02/21/15 145/66   Patient's blood pressure on arrival to clinic was 176/86 with pulse 119, recheck 159/82 with pulse 113. Her blood pressures have been somewhat elevated for the last 6 months with pulses in the upper limits of normal to above 100 bpm. She is taking Amlodipine 5 mg and Maxzide 37.5-25 mg. Previous echo shows EF of 65-70%.  -Will try low dose Coreg 3.175 mg daily for blood pressure and to lower heart rate -Continue Amlodipine 5 mg daily and Maxzide 37.5-25 mg daily

## 2015-04-09 NOTE — Progress Notes (Signed)
Patient ID: Cassandra Park, female   DOB: [birth year], 54 y.o.   MRN: [id number]   Subjective:   Patient ID: Cassandra Park female   DOB: 1961-01-13 54 y.o.   MRN: [id number]  HPI: [name] is a 54 y.o. female with PMH of HTN, OA, and HLD who presents to clinic with complaints of abdominal bloating and gas. She states that she has these symptoms for the last month. She came to clinic at that time and was advised to try OTC medications such as Gas-X or Bean-O. She states that she tried Gas-X which was helpful at first, but her symptoms are continued. She does state that she has only used the Gas-X once every 2-3 days, but is taking Tums daily. She describes her bloating as an achy pain across her lower abdomen. She states that there are times where she feels as if she needs to pass wind but is unable.  She reports constipation since last week. She did not have a bowel movement for several days until she tried a laxative (Bisacodyl) which allowed her to have a movement two days ago. She has no bowel movement since then, and has not used laxative either.  She also states that she has associated symptoms of heartburn which usually occur when she is sitting or around mealtime. She states that she does not have these symptoms when supine.    Past Medical History  Diagnosis Date  . Vaginal bleeding, abnormal June 2010    Thought to be 2/2 DUB vs endometrial CA. Gyn referral made at that time.  . Allergic rhinitis   . HTN (hypertension)   . Hyperlipidemia   . Osteoarthritis     Left hip  . Substance abuse     tobacco, alcohol  . 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 2 pap smears since then normal. [facility]   Current Outpatient Prescriptions  Medication Sig Dispense Refill  . amLODipine (NORVASC) 5 MG tablet Take 1 tablet (5 mg total) by mouth daily. 30 tablet 2  .  aspirin 81 MG tablet Take 81 mg by mouth daily.    . calcium-vitamin D (OSCAL WITH D 500-200) 500-200 MG-UNIT per tablet Take 1 tablet by mouth 2 (two) times daily.      . carvedilol (COREG) 3.125 MG tablet Take 1 tablet (3.125 mg total) by mouth daily. 60 tablet 11  . lovastatin (MEVACOR) 20 MG tablet TAKE ONE TABLET BY MOUTH ONCE DAILY 30 tablet 5  . triamterene-hydrochlorothiazide (MAXZIDE-25) 37.5-25 MG per tablet Take 1 tablet by mouth daily. 30 tablet 5   No current facility-administered medications for this visit.   Family History  Problem Relation Age of Onset  . Appendicitis Father     deceased   History   Social History  . Marital Status: Single    Spouse Name: N/A  . Number of Children: N/A  . Years of Education: N/A   Social History Main Topics  . Smoking status: Former Smoker -- 0.50 packs/day    Types: Cigarettes  . Smokeless tobacco: Former Systems developer    Quit date: 03/29/2013     Comment: already quit on 03/29/13   . Alcohol Use: 0.0 oz/week    0 Standard drinks or equivalent per week     Comment: occasionally  . Drug Use: No  .  Sexual Activity: Not on file   Other Topics Concern  . None   Social History Narrative   Divorced   3 children   Works at [employer] as a Programme researcher, 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 [employer] at [employer] and has [facility] card, [employer] September 24, 2010 4.31pm            Review of Systems: Review of Systems  Constitutional: Negative for fever and chills.  Respiratory: Negative for sputum production and shortness of breath.   Cardiovascular: Negative for chest pain and palpitations.  Gastrointestinal: Positive for heartburn, abdominal pain and constipation. Negative for nausea, vomiting, diarrhea, blood in stool and melena.  Genitourinary: Negative for dysuria, urgency, frequency and hematuria.  Neurological: Negative for headaches.    Objective:  Physical Exam: Filed Vitals:   04/09/15 1400 04/09/15 1423    BP: 176/86 159/82  Pulse: 119 113  Temp: 97.5 F (36.4 C)   TempSrc: Oral   Weight: 188 lb 12.8 oz (85.639 kg)   SpO2: 99%    Physical Exam  Constitutional: She is oriented to person, place, and time. She appears well-developed and well-nourished.  HENT:  Head: Normocephalic and atraumatic.  Cardiovascular: Regular rhythm and intact distal pulses.  Tachycardia present.   Pulmonary/Chest: Effort normal and breath sounds normal.  Abdominal: Soft. Bowel sounds are normal. She exhibits no distension. There is no tenderness.  Musculoskeletal: Normal range of motion.  Neurological: She is alert and oriented to person, place, and time.  Skin: Skin is warm.  Psychiatric: She has a normal mood and affect.    Assessment & Plan:  Please see problem list for current assessment and plan.

## 2015-04-09 NOTE — Progress Notes (Signed)
Internal Medicine Clinic Attending  I saw and evaluated the patient.  I personally confirmed the key portions of the history and exam documented by Dr. Zada Finders and I reviewed pertinent patient test results.  The assessment, diagnosis, and plan were formulated together and I agree with the documentation in the resident's note.  54 year old with hypertension, tachycardia and constipation/heartburn. Plan to start her on trial of omeprazole, and coreg for controlling hypertension. Miralax as needed for constipation and education for avoiding constipation provided. Madilyn Fireman MD MPH 04/09/2015 3:39 PM

## 2015-04-11 ENCOUNTER — Telehealth: Payer: Self-pay | Admitting: Internal Medicine

## 2015-04-11 NOTE — Telephone Encounter (Signed)
Pt called and ask should she start taking the omprazole now, she was advised to do so also that she would take the coreg when she picked it up today

## 2015-04-11 NOTE — Telephone Encounter (Signed)
Patient states she has a question about medication that was prescribed for her on Monday 04/09/15.

## 2015-04-23 ENCOUNTER — Ambulatory Visit (HOSPITAL_COMMUNITY)
Admission: RE | Admit: 2015-04-23 | Discharge: 2015-04-23 | Disposition: A | Discharge status: Home / Self Care | Payer: No Typology Code available for payment source | Source: Ambulatory Visit | Attending: Internal Medicine | Admitting: Internal Medicine

## 2015-04-23 DIAGNOSIS — Z1231 Encounter for screening mammogram for malignant neoplasm of breast: Secondary | ICD-10-CM | POA: Insufficient documentation

## 2015-04-25 ENCOUNTER — Telehealth: Payer: Self-pay | Admitting: Internal Medicine

## 2015-04-25 NOTE — Telephone Encounter (Signed)
Patient states she has not had a BM for 3 days. This has been an issue for her in the recent past. Seen in the clinic, told to use Miralax, Bisacodyl, Milk of Magnesia. These agents seemed to work quite well for her. She has since stopped. She states she is passing gas and is only having mild abdominal pain, but denies nausea, vomiting, or decreased appetite.  -Advised using Miralax bid as needed to assist in passing BM. Can also try milk of magnesia if needed.  -Discussed using a stool bulking agent such as Metamucil -Advised hydration, fiber, prune juice, etc.  -If no BM within the next 1-2 days, patient to call clinic and discuss further options (magnesium citrate) vs possible follow up appointment.   Natasha Bence, MD PGY-3, Internal Medicine Pager: [phone]

## 2015-04-26 ENCOUNTER — Telehealth: Payer: Self-pay | Admitting: Internal Medicine

## 2015-04-26 NOTE — Telephone Encounter (Signed)
Pt called requesting the nurse to call back regarding about pt taking Miralax. Please call pt back.

## 2015-04-26 NOTE — Telephone Encounter (Signed)
I returned pt's call. She had a question about Miralax. She had talked with Dr Ronnald Ramp last night so I read his recommendation.  Advised using Miralax bid as needed to assist in passing BM. She is okay with this and will call back if any problem.

## 2015-04-29 ENCOUNTER — Telehealth: Payer: Self-pay | Admitting: Internal Medicine

## 2015-04-29 NOTE — Telephone Encounter (Signed)
Received a call from Ms Zacarias that she was constipated last week but that it was resolved with Miralax, she has not become constipated again with no BM in 3 days,  She took 1 dose of miralax last night without a BM and now she is concerned about what to do next ("Can I take a second dose?")  I advised that she should take Miralax now, and then may even take again the PM (5-6pm) if she does not have a BM.  I advised her to call the clinic tomorrow if she did not have a bowel movement. And advised she call back, go to urgent care, or to the ED if she develops severe uncontrolled abdominal pain.  Lucious Groves, DO

## 2015-04-30 ENCOUNTER — Telehealth: Payer: Self-pay | Admitting: Internal Medicine

## 2015-04-30 NOTE — Telephone Encounter (Signed)
Patient taking miralax  and is still constipated x 1 week.  Would like some advice as it is not working.

## 2015-04-30 NOTE — Telephone Encounter (Signed)
Talked with pt - usually has BM every 2 days. Doing Miralax daily and no BM in one week. Stopped drinking coffee recently. Suggest to do glycerin supp or fleets enema  now for quick results. Continue with Miralax. If no results call clinic back. Hilda Blades Midori Dado RN 04/30/15 10AM

## 2015-05-01 ENCOUNTER — Telehealth: Payer: Self-pay | Admitting: Internal Medicine

## 2015-05-01 DIAGNOSIS — K5900 Constipation, unspecified: Secondary | ICD-10-CM

## 2015-05-01 MED ORDER — SENNOSIDES-DOCUSATE SODIUM 8.6-50 MG PO TABS: 1.0000 | ORAL_TABLET | Freq: Every day | ORAL | Status: DC | PRN

## 2015-05-01 NOTE — Telephone Encounter (Addendum)
Returned call to patient.  She states she talked with someone in clinic yesterday, took advise and used glycerin suppository with small amount of results.   I read the notes and advised her to try the fleets enema today and if no results to call us back.  She agrees and will continue with the Miralax .  I talked with Dr Arcelia Jew and she was not aware of the above problem.  So I called pt back to get more information and she states her last BM was on Thursday the 11th.  No change in diet and she is drinking a lot of fluids.  She did have small hard BM yesterday after glycerine suppository.   Please advise, I told her to hold off on the fleets enema until I call her back.

## 2015-05-01 NOTE — Telephone Encounter (Signed)
Pt is calling nurse back about the constipation

## 2015-05-01 NOTE — Telephone Encounter (Signed)
Pt called to let us know she took the senokot and it worked well.  She had a nice BM.

## 2015-05-01 NOTE — Telephone Encounter (Signed)
Pt called requesting the nurse to call back about constipation and what to do. Please call pt back.

## 2015-05-01 NOTE — Telephone Encounter (Signed)
Agree to hold off on fleet enemas. She can try Senokot-S. Can we phone in prescription? I placed order. Thanks!

## 2015-05-01 NOTE — Telephone Encounter (Signed)
Talked with pt and she will get the Senokot-s. It's over-the-counter and cost $5 at [facility]. Pt will try and call us back if needed.

## 2015-05-07 ENCOUNTER — Encounter: Payer: Self-pay | Admitting: Internal Medicine

## 2015-05-07 ENCOUNTER — Ambulatory Visit (INDEPENDENT_AMBULATORY_CARE_PROVIDER_SITE_OTHER): Payer: Self-pay | Admitting: Internal Medicine

## 2015-05-07 VITALS — BP 156/83 | HR 93 | Temp 98.1°F | Wt 181.1 lb

## 2015-05-07 DIAGNOSIS — R141 Gas pain: Secondary | ICD-10-CM

## 2015-05-07 DIAGNOSIS — I10 Essential (primary) hypertension: Secondary | ICD-10-CM

## 2015-05-07 MED ORDER — AMLODIPINE BESYLATE 10 MG PO TABS: 10.0000 mg | ORAL_TABLET | Freq: Every day | ORAL | Status: DC

## 2015-05-07 MED ORDER — OMEPRAZOLE 20 MG PO CPDR: 20.0000 mg | DELAYED_RELEASE_CAPSULE | Freq: Every day | ORAL | Status: DC

## 2015-05-07 NOTE — Patient Instructions (Signed)
-   Take Sennokot-S once daily to help your constipation - If you are having loose stools or more frequent stools you can cut down to taking it as needed - Start taking Amlodipine 10 mg daily  General Instructions:   Thank you for bringing your medicines today. This helps [place] keep you safe from mistakes.   Progress Toward Treatment Goals:  Treatment Goal 02/27/2015  Blood pressure at goal  Stop smoking -    Self Care Goals & Plans:  Self Care Goal 05/07/2015  Manage my medications take my medicines as prescribed; bring my medications to every visit; refill my medications on time  Monitor my health keep track of my blood pressure; keep track of my weight  Eat healthy foods eat baked foods instead of fried foods; eat foods that are low in salt  Be physically active find an activity I enjoy  Stop smoking -    No flowsheet data found.   Care Management & Community Referrals:  Referral 02/27/2015  Referrals made for care management support none needed

## 2015-05-07 NOTE — Assessment & Plan Note (Signed)
Constipation and gassy pains much improved after using Senokot and Omeprazole 20 mg daily. Patient recommended to stop using Miralax and to use Senokot daily. She was instructed to cut down use of Senokot if she finds she is having frequent loose stools. Encouraged to continue high fiber diet and drinking plenty of water. Will continue Omeprazole 20 mg daily for 2 more months and then try trial without to see if symptoms controlled without medication.

## 2015-05-07 NOTE — Assessment & Plan Note (Addendum)
BP still mildly elevated at 156/83 today. She was started on Coreg 3.125 mg daily last visit due to an elevated HR as well. Her HR is normal at 93 today. Will increase her Amlodipine to 10 mg daily and have her follow up in 2 weeks for a BP recheck. - Given Rx for Amlodipine 10 mg daily - Continue Coreg 3.125 mg daily and Maxzide 37.5-25 mg daily - F/u visit in 2 weeks

## 2015-05-07 NOTE — Progress Notes (Signed)
   Subjective:    Patient ID: Cassandra Park, female    DOB: [birth year], 54 y.o.   MRN: [id number]  HPI Cassandra Park is a 54yo woman with PMHx of HTN, hyperlipidemia, and OA who presents today for a follow up visit in regards to her abdominal gas pains and constipation.   She did not have success with Gas-X or Miralax. She was then recommended to try Senokot-S over the phone. She reports the Senokot worked very well and relieved her constipation and gassy pains. She also notes her heartburn has improved with omeprazole. She notes she increased her fiber and water intake which has helped as well.    Review of Systems General: Denies fever, chills, night sweats, changes in weight, changes in appetite HEENT: Denies headaches, ear pain, changes in vision, rhinorrhea, sore throat CV: Denies CP, palpitations, SOB, orthopnea Pulm: Denies SOB, cough, wheezing GI: Denies nausea, vomiting, diarrhea, melena, hematochezia GU: Denies dysuria, hematuria, frequency Msk: Denies muscle cramps, joint pains Neuro: Denies weakness, numbness, tingling Skin: Denies rashes, bruising    Objective:   Physical Exam General: alert, sitting up in chair, NAD HEENT: [place]/AT, EOMI, sclera anicteric CV: RRR, 2/6 systolic murmur heard best at LUSB Pulm: CTA bilaterally, breaths non-labored Abd: BS+, soft, non-tender, non-distended Ext: warm, no edema Neuro: alert and oriented x 3     Assessment & Plan:  Refer to A&P documentation

## 2015-05-07 NOTE — Progress Notes (Signed)
Case discussed with Dr. Rivet at time of visit.  We reviewed the resident's history and exam and pertinent patient test results.  I agree with the assessment, diagnosis, and plan of care documented in the resident's note. 

## 2015-05-14 ENCOUNTER — Other Ambulatory Visit: Payer: Self-pay | Admitting: Internal Medicine

## 2015-05-14 ENCOUNTER — Telehealth: Payer: Self-pay | Admitting: Internal Medicine

## 2015-05-14 NOTE — Telephone Encounter (Signed)
Pt called with c/o constipation.  Last BM on 8/24, she has taken senokot 2 times since then but not sure what else to do. I advised to drink lots of water, eat fruits and vegetables and take Senokot once a day until she has a BM Call back if not better. She is asking if any of her medication can cause constipation as she has not been constipated in the past.

## 2015-05-14 NOTE — Telephone Encounter (Signed)
Pt want to know what to take for constipation. Please call pt back.

## 2015-05-15 NOTE — Telephone Encounter (Signed)
Patient is calling about the constipation. States to call at work until 3 pm. After call home number.

## 2015-05-15 NOTE — Telephone Encounter (Signed)
Agree with continuing Senokot and to call back if no relief. None of her medications should cause constipation.

## 2015-05-15 NOTE — Telephone Encounter (Signed)
Instructed patient to take Miralax now since that has worked for her in past. Recommended to try Senokot twice daily starting tomorrow and see if that helps her bowel movements. She was recommended to pay attention to which agent works better for her constipation. Patient instructed to call clinic later this week if still having problems.

## 2015-06-05 ENCOUNTER — Ambulatory Visit (INDEPENDENT_AMBULATORY_CARE_PROVIDER_SITE_OTHER): Payer: Self-pay | Admitting: Internal Medicine

## 2015-06-05 ENCOUNTER — Encounter: Payer: Self-pay | Admitting: Internal Medicine

## 2015-06-05 VITALS — BP 150/70 | HR 108 | Temp 97.9°F | Wt 190.1 lb

## 2015-06-05 DIAGNOSIS — I10 Essential (primary) hypertension: Secondary | ICD-10-CM

## 2015-06-05 DIAGNOSIS — K5900 Constipation, unspecified: Secondary | ICD-10-CM

## 2015-06-05 MED ORDER — CARVEDILOL 3.125 MG PO TABS: 3.1250 mg | ORAL_TABLET | Freq: Two times a day (BID) | ORAL | Status: DC

## 2015-06-05 NOTE — Progress Notes (Signed)
   Subjective:    Patient ID: Cassandra Park, female    DOB: 07-06-1961, 54 y.o.   MRN: [id number]  HPI Cassandra Park is a 54yo woman with PMHx of HTN, hyperlipidemia, and osteoarthritis who presents today for follow up of her HTN. Please see the A&P for the status of the patient's chronic medical problems.   Review of Systems General: Denies fever, chills, night sweats, changes in weight, changes in appetite HEENT: Denies headaches, ear pain, changes in vision, rhinorrhea, sore throat CV: Denies CP, palpitations, SOB, orthopnea Pulm: Denies SOB, cough, wheezing GI: Reports constipation- chronic, but improving with miralax daily. Denies abdominal pain, nausea, vomiting, diarrhea, melena, hematochezia GU: Denies dysuria, hematuria, frequency Msk: Denies muscle cramps, joint pains Neuro: Denies weakness, numbness, tingling Skin: Denies rashes, bruising Psych: Denies depression, anxiety, hallucinations    Objective:   Physical Exam General: alert, sitting up in chair, NAD HEENT:  [place]/AT, EOMI, sclera anicteric, mucus membranes moist CV: RRR, 3/6 systolic murmur  Pulm: CTA bilaterally, breaths non-labored Abd: BS+, soft, non-tender, non-distended Ext: warm, no peripheral edema Neuro: alert and oriented x 3    Assessment & Plan:  Refer to A&P documentation.

## 2015-06-05 NOTE — Assessment & Plan Note (Signed)
Patient reports her constipation is doing much better since switching to Miralax daily. She reports having 1 BM a day or 1 every other day. She denies any gas related pains like she was having previously. She denies any bloody stools or dark stools. Her stool cards were negative for blood this year in July.  - Will check CBC to make sure she is not anemic and not missing a malignancy that could be causing her constipation - Check TSH to rule out hypothyroidism as cause - Continue Miralax daily - Advised to adjust Miralax (increase or decrease) based on frequency of BMs

## 2015-06-05 NOTE — Patient Instructions (Signed)
-   Change Carvedilol to twice daily - Blood work today - Continue Miralax once daily. Can adjust to more or less depending upon how many bowel movements you are having  General Instructions:   Thank you for bringing your medicines today. This helps [place] keep you safe from mistakes.   Progress Toward Treatment Goals:  Treatment Goal 02/27/2015  Blood pressure at goal  Stop smoking -    Self Care Goals & Plans:  Self Care Goal 06/05/2015  Manage my medications take my medicines as prescribed; bring my medications to every visit  Monitor my health keep track of my blood pressure; bring my blood pressure log to each visit  Eat healthy foods eat foods that are low in salt; eat baked foods instead of fried foods  Be physically active find an activity I enjoy  Stop smoking -    No flowsheet data found.   Care Management & Community Referrals:  Referral 02/27/2015  Referrals made for care management support none needed

## 2015-06-05 NOTE — Assessment & Plan Note (Signed)
BP Readings from Last 3 Encounters:  06/05/15 150/70  05/07/15 156/83  04/09/15 159/82    Lab Results  Component Value Date   NA 138 02/27/2015   K 3.6 02/27/2015   CREATININE 0.64 02/27/2015    Assessment: Blood pressure control: controlled Progress toward BP goal:  at goal Comments: Initial BP mildly elevated with elevated HR, but on repeat BP 135/70 with HR 80.   Plan: Medications:  Continue Amlodipine 10 mg daily and Maxzide 37.5-25 mg daily. Instructed patient to increase Coreg 3.125 mg to twice daily instead of once daily.  Educational resources provided: brochure Self management tools provided: home blood pressure logbook

## 2015-06-06 LAB — CBC
Hematocrit: 38.7 % (ref 34.0–46.6)
Hemoglobin: 12.6 g/dL (ref 11.1–15.9)
MCH: 30.1 pg (ref 26.6–33.0)
MCHC: 32.6 g/dL (ref 31.5–35.7)
MCV: 93 fL (ref 79–97)
Platelets: 321 10*3/uL (ref 150–379)
RBC: 4.18 x10E6/uL (ref 3.77–5.28)
RDW: 14.3 % (ref 12.3–15.4)
WBC: 7.3 10*3/uL (ref 3.4–10.8)

## 2015-06-06 LAB — TSH: TSH: 0.948 u[IU]/mL (ref 0.450–4.500)

## 2015-06-06 NOTE — Progress Notes (Signed)
Internal Medicine Clinic Attending  Case discussed with Dr. Rivet at the time of the visit.  We reviewed the resident's history and exam and pertinent patient test results.  I agree with the assessment, diagnosis, and plan of care documented in the resident's note.  

## 2015-06-11 NOTE — Progress Notes (Signed)
Rx for generic Coreg called in to [facility]. Hilda Blades Ditzler RN 06/11/15 3:45PM

## 2015-07-17 ENCOUNTER — Encounter: Payer: Self-pay | Admitting: Internal Medicine

## 2015-07-17 ENCOUNTER — Ambulatory Visit (INDEPENDENT_AMBULATORY_CARE_PROVIDER_SITE_OTHER): Payer: Self-pay | Admitting: Internal Medicine

## 2015-07-17 VITALS — BP 143/75 | HR 92 | Temp 98.4°F | Ht 69.0 in | Wt 196.2 lb

## 2015-07-17 DIAGNOSIS — I10 Essential (primary) hypertension: Secondary | ICD-10-CM

## 2015-07-17 DIAGNOSIS — R600 Localized edema: Secondary | ICD-10-CM

## 2015-07-17 NOTE — Patient Instructions (Signed)
-   Wear compression stockings daily to help with your leg/feet swelling - Please make note if you develop any shortness of breath or chest pain  General Instructions:   Please bring your medicines with you each time you come to clinic.  Medicines may include prescription medications, over-the-counter medications, herbal remedies, eye drops, vitamins, or other pills.   Progress Toward Treatment Goals:  Treatment Goal 06/05/2015  Blood pressure at goal  Stop smoking -    Self Care Goals & Plans:  Self Care Goal 06/05/2015  Manage my medications take my medicines as prescribed; bring my medications to every visit  Monitor my health keep track of my blood pressure; bring my blood pressure log to each visit  Eat healthy foods eat foods that are low in salt; eat baked foods instead of fried foods  Be physically active find an activity I enjoy  Stop smoking -    No flowsheet data found.   Care Management & Community Referrals:  Referral 02/27/2015  Referrals made for care management support none needed

## 2015-07-19 DIAGNOSIS — R600 Localized edema: Secondary | ICD-10-CM | POA: Insufficient documentation

## 2015-07-19 NOTE — Assessment & Plan Note (Signed)
BP Readings from Last 3 Encounters:  07/17/15 143/75  06/05/15 150/70  05/07/15 156/83    Lab Results  Component Value Date   NA 138 02/27/2015   K 3.6 02/27/2015   CREATININE 0.64 02/27/2015    Assessment: Blood pressure control: mildly elevated Progress toward BP goal:  unchanged Comments: BP acceptable.  Plan: Medications:  Continue Amlodipine 10 mg daily, Coreg 3.125 mg BID, and Maxzide 37.5-25 mg daily.

## 2015-07-19 NOTE — Progress Notes (Signed)
   Subjective:    Patient ID: Cassandra Park, female    DOB: 20-Nov-1960, 54 y.o.   MRN: [id number]  HPI Ms. Barba is a 54yo woman with PMHx of HTN, OA, and hyperlipidemia who presents today with complaint of bilateral lower extremity edema.  Patient states she noticed swelling in her legs about 1 week ago. She states the swelling is worse when she is on her feet all day, especially at work. She states the swelling gets better if she props up her feet. She reports her legs will occasionally get very tight and this is painful. She denies dyspnea, chest pain, or difficulty urinating.    Review of Systems General: Denies fever, chills, night sweats, changes in weight, changes in appetite HEENT: Denies headaches, ear pain, changes in vision, rhinorrhea, sore throat CV: Denies CP, palpitations, orthopnea Pulm: Denies cough, wheezing GI: Denies abdominal pain, nausea, vomiting, diarrhea, constipation, melena, hematochezia GU: Denies dysuria, hematuria, frequency Msk: Denies muscle cramps, joint pains Neuro: Denies weakness, numbness, tingling Skin: Denies rashes, bruising Psych: Denies depression, anxiety, hallucinations    Objective:   Physical Exam General: sitting up, NAD, pleasant HEENT: [place]/AT, EOMI, sclera anicteric, mucus membranes moist CV: 3/6 systolic murmur heard best at RUSB Pulm: CTA bilaterally, breaths non-labored Ext: warm, 1+ pitting edema in lower extremities bilaterally. No calf tenderness or erythema.  Neuro: alert and oriented x 3     Assessment & Plan:  Please refer to A&P documentation.

## 2015-07-19 NOTE — Assessment & Plan Note (Signed)
Most likely secondary to her increase in Amlodipine on 8/22 visit. She had a normal echo last year and no other signs/symptoms of HF. TSH normal on 9/20. Renal function normal in June. Patient recommended to wear compression stockings daily to see if this will help her swelling. She was educated on the side effects of Amlodipine.

## 2015-07-23 NOTE — Progress Notes (Signed)
Internal Medicine Clinic Attending  Case discussed with Dr. Arcelia Jew soon after the resident saw the patient.  We reviewed the resident's history and exam and pertinent patient test results.  I agree with the assessment, diagnosis, and plan of care documented in the resident's note.  If compression stockings not helping, would recommend switching to a different class of anti-hypertensive.

## 2015-08-15 ENCOUNTER — Ambulatory Visit: Payer: Self-pay

## 2015-08-21 ENCOUNTER — Ambulatory Visit: Payer: Self-pay

## 2015-09-04 ENCOUNTER — Encounter: Payer: Self-pay | Admitting: Internal Medicine

## 2015-09-18 ENCOUNTER — Ambulatory Visit (INDEPENDENT_AMBULATORY_CARE_PROVIDER_SITE_OTHER): Payer: Self-pay | Admitting: Internal Medicine

## 2015-09-18 ENCOUNTER — Encounter: Payer: Self-pay | Admitting: Internal Medicine

## 2015-09-18 VITALS — BP 137/67 | HR 101 | Temp 98.5°F | Ht 69.0 in | Wt 197.1 lb

## 2015-09-18 DIAGNOSIS — Z0000 Encounter for general adult medical examination without abnormal findings: Secondary | ICD-10-CM | POA: Insufficient documentation

## 2015-09-18 DIAGNOSIS — K5900 Constipation, unspecified: Secondary | ICD-10-CM

## 2015-09-18 DIAGNOSIS — R600 Localized edema: Secondary | ICD-10-CM

## 2015-09-18 DIAGNOSIS — E785 Hyperlipidemia, unspecified: Secondary | ICD-10-CM

## 2015-09-18 DIAGNOSIS — M170 Bilateral primary osteoarthritis of knee: Secondary | ICD-10-CM

## 2015-09-18 DIAGNOSIS — I10 Essential (primary) hypertension: Secondary | ICD-10-CM

## 2015-09-18 DIAGNOSIS — Z7189 Other specified counseling: Secondary | ICD-10-CM

## 2015-09-18 DIAGNOSIS — Z124 Encounter for screening for malignant neoplasm of cervix: Secondary | ICD-10-CM | POA: Insufficient documentation

## 2015-09-18 MED ORDER — AMLODIPINE BESYLATE 10 MG PO TABS: 10.0000 mg | ORAL_TABLET | Freq: Every day | ORAL | Status: DC

## 2015-09-18 MED ORDER — TRIAMTERENE-HCTZ 37.5-25 MG PO TABS
1.0000 | ORAL_TABLET | Freq: Every day | ORAL | Status: DC
Start: 2015-09-18 — End: 2016-03-13

## 2015-09-18 MED ORDER — LOVASTATIN 20 MG PO TABS: 20.0000 mg | ORAL_TABLET | Freq: Every day | ORAL | Status: DC

## 2015-09-18 NOTE — Patient Instructions (Signed)
-   [facility] If you have any issues getting the medication please let us know. - Bring MOST form back to your next visit - Follow up appointment in 6 months - Blood work at next visit  General Instructions:   Thank you for bringing your medicines today. This helps [place] keep you safe from mistakes.   Progress Toward Treatment Goals:  Treatment Goal 07/17/2015  Blood pressure unchanged  Stop smoking -    Self Care Goals & Plans:  Self Care Goal 07/17/2015  Manage my medications take my medicines as prescribed; bring my medications to every visit; refill my medications on time  Monitor my health keep track of my blood pressure  Eat healthy foods eat more vegetables; eat foods that are low in salt; eat baked foods instead of fried foods  Be physically active find an activity I enjoy  Stop smoking -    No flowsheet data found.   Care Management & Community Referrals:  Referral 02/27/2015  Referrals made for care management support none needed

## 2015-09-18 NOTE — Assessment & Plan Note (Signed)
Continue Senna as needed.  

## 2015-09-18 NOTE — Assessment & Plan Note (Signed)
We started a discussion about advanced directives today. We discussed the benefits of providing this information early while she is well and initiating a discussion with her children about her wishes for goals of care. I went over the MOST form with her and provided one for her to fill out. She plans on bringing it back to her next visit for me to sign. She understands that this form can be changed at any time.

## 2015-09-18 NOTE — Assessment & Plan Note (Signed)
BP Readings from Last 3 Encounters:  09/18/15 137/67  07/17/15 143/75  06/05/15 150/70    Lab Results  Component Value Date   NA 138 02/27/2015   K 3.6 02/27/2015   CREATININE 0.64 02/27/2015    Assessment: Blood pressure control:  Controlled Progress toward BP goal:   At goal Comments: BP now well controlled. Will continue current regimen.  Plan: Medications:  Continue Amlodipine 10 mg daily, Coreg 3.125 mg BID, and Maxzide 37.5-25 mg daily.

## 2015-09-18 NOTE — Assessment & Plan Note (Signed)
Likely secondary to Amlodipine. Edema much improved compared to last visit. Recommended to continue walking around more even when she goes back to work and to continue TED hose.

## 2015-09-18 NOTE — Assessment & Plan Note (Signed)
Refilled her lovastatin.

## 2015-09-18 NOTE — Progress Notes (Signed)
   Subjective:    Patient ID: SHAYLE AGE, female    DOB: 23-Feb-1961, 55 y.o.   MRN: [id number]  HPI Cassandra Park is a 55yo woman with PMHx of HTN, hyperlipidemia, and OA who presents today for follow up of her HTN.  HTN: BP well controlled at 137/67. She takes Amlodipine 10 mg daily, Coreg 3.125 mg BID, and Maxzide 37.5-25 mg daily.  OA: Reports she has not had much pain lately even with the cold weather. She uses a heating pad occasionally if she does have pain.   Constipation: Reports her constipation has improved. She is only needing Senna about once per week. Denies any dark or bloody bowel movements.  HLD: Requesting a refill on her lovastatin today. She is on Lovastatin 20 mg daily.  Bilateral LE Edema: She notes her edema has improved the last few weeks since being off from work with the holidays (she works as a Recruitment consultant.) She states the TED hose are helping as well. She denies any chest pain or SOB.   Review of Systems General: Denies fever, chills, night sweats, changes in weight, changes in appetite HEENT: Denies headaches, ear pain, changes in vision, rhinorrhea, sore throat CV: Denies CP, palpitations, SOB, orthopnea Pulm: Denies SOB, cough, wheezing GI: Denies abdominal pain, nausea, vomiting, diarrhea, melena, hematochezia GU: Denies dysuria, hematuria, frequency Msk: Denies muscle cramps, joint pains Neuro: Denies weakness, numbness, tingling Skin: Denies rashes, bruising Psych: Denies depression, anxiety, hallucinations    Objective:   Physical Exam General: alert, sitting up, NAD HEENT: [place]/AT, EOMI, sclera anicteric, mucus membranes moist CV: RRR, 3/6 systolic murmur Pulm: CTA bilaterally, breaths non-labored Abd: BS+, soft, obese, non-tender Ext: warm, trace edema, wearing TED hose Neuro: alert and oriented x 3, no focal deficits    Assessment & Plan:  Please refer to A&P documentation.

## 2015-09-18 NOTE — Assessment & Plan Note (Signed)
Continue heating pad as needed. Suggested to patient she can use OTC tylenol or ibuprofen if the pain worsens.

## 2015-09-19 NOTE — Progress Notes (Signed)
Internal Medicine Clinic Attending  Case discussed with Dr. Rivet at the time of the visit.  We reviewed the resident's history and exam and pertinent patient test results.  I agree with the assessment, diagnosis, and plan of care documented in the resident's note.  

## 2016-02-04 ENCOUNTER — Ambulatory Visit (INDEPENDENT_AMBULATORY_CARE_PROVIDER_SITE_OTHER): Payer: Self-pay | Admitting: Internal Medicine

## 2016-02-04 ENCOUNTER — Encounter: Payer: Self-pay | Admitting: Internal Medicine

## 2016-02-04 VITALS — BP 157/75 | HR 107 | Temp 98.2°F | Wt 193.8 lb

## 2016-02-04 DIAGNOSIS — N898 Other specified noninflammatory disorders of vagina: Secondary | ICD-10-CM | POA: Insufficient documentation

## 2016-02-04 NOTE — Progress Notes (Signed)
Subjective:   Patient ID: Cassandra Park female   DOB: Jun 09, 1961 55 y.o.   MRN: [id number]  HPI: [name] is a 55 y.o. female w/ PMHx of HTN, HLD, OA, presents to the clinic today for an acute visit for vaginal discharge. She says she has had this for a few days now, is generally clear/cloudy, no odor, no discomfort. She denies pruritis, burning, abdominal pain, flank pain, fever, chills, nausea, or vomiting. She is not sexually active, has not been for 1.5 years per the patient. She denies any vaginal bleeding. No diarrhea or constipation, no difficulty urinating. Patient has had previous h/o high risk HPV in the past but has had normal pap smears since that time, most recently 2015. Patient had vaginal discomfort at that time in 2015 and was found to have BV for which she was treated with a course of Flagyl. She says this may be the same thing from what she remembers.   Past Medical History  Diagnosis Date  . Vaginal bleeding, abnormal June 2010    Thought to be 2/2 DUB vs endometrial CA. Gyn referral made at that time.  . Allergic rhinitis   . HTN (hypertension)   . Hyperlipidemia   . Osteoarthritis     Left hip  . Substance abuse     tobacco, alcohol  . 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 2 pap smears since then normal. [facility]   Current Outpatient Prescriptions  Medication Sig Dispense Refill  . amLODipine (NORVASC) 10 MG tablet Take 1 tablet (10 mg total) by mouth daily. 30 tablet 5  . aspirin 81 MG tablet Take 81 mg by mouth daily.    . calcium-vitamin D (OSCAL WITH D 500-200) 500-200 MG-UNIT per tablet Take 1 tablet by mouth 2 (two) times daily.      . carvedilol (COREG) 3.125 MG tablet Take 1 tablet (3.125 mg total) by mouth 2 (two) times daily with a meal. 60 tablet 11  . lovastatin (MEVACOR) 20 MG tablet Take 1 tablet (20 mg total) by mouth  daily. 30 tablet 5  . polyethylene glycol (MIRALAX / GLYCOLAX) packet Take 17 g by mouth daily. Reported on 09/18/2015    . senna-docusate (SENOKOT-S) 8.6-50 MG per tablet Take 1 tablet by mouth daily as needed for mild constipation. 30 tablet 1  . triamterene-hydrochlorothiazide (MAXZIDE-25) 37.5-25 MG tablet Take 1 tablet by mouth daily. 30 tablet 5   No current facility-administered medications for this visit.    Review of Systems: General: Denies fever, chills, diaphoresis, appetite change and fatigue.  Respiratory: Denies SOB, DOE, cough, and wheezing.   Cardiovascular: Denies chest pain and palpitations.  Gastrointestinal: Denies nausea, vomiting, abdominal pain, and diarrhea.  Genitourinary: Positive for vaginal discharge. Denies dysuria, increased frequency, and flank pain. Endocrine: Denies hot or cold intolerance, polyuria, and polydipsia. Musculoskeletal: Denies myalgias, back pain, joint swelling, arthralgias and gait problem.  Skin: Denies pallor, rash and wounds.  Neurological: Denies dizziness, seizures, syncope, weakness, lightheadedness, numbness and headaches.  Psychiatric/Behavioral: Denies mood changes, and sleep disturbances.  Objective:   Physical Exam: Filed Vitals:   02/04/16 1548  BP: 157/75  Pulse: 107  Temp: 98.2 F (36.8 C)  TempSrc: Oral  Weight: 193 lb 12.8 oz (87.907 kg)  SpO2: 100%    General: Alert, cooperative, NAD.  HEENT: PERRL, EOMI. Moist mucus membranes. Right corneal clouding.  Neck: Full range of motion without pain, supple, no lymphadenopathy or carotid bruits Lungs: Clear to ascultation bilaterally, normal work of respiration, no wheezes, rales, rhonchi Heart: RRR, no murmurs, gallops, or rubs Abdomen: Soft, non-tender, non-distended, BS + GU: Pelvic exam elicited no tenderness. Moderate amount of cloudy/whitish discharge seen at the cervical os, with 2-3 small punctate lesions seen on the cervix, possibly nabothian cyst.  Extremities:  No cyanosis, clubbing, or edema Neurologic: Alert & oriented X3, cranial nerves II-XII intact, strength grossly intact, sensation intact to light touch   Assessment & Plan:   Please see problem based assessment and plan.

## 2016-02-04 NOTE — Patient Instructions (Signed)
1. Please make a follow up appointment for 6 weeks.   I will call you with the results of your pelvic exam.   2. Please take all medications as previously prescribed.  If an antibiotic is needed, I will call this in for you.   3. If you have worsening of your symptoms or new symptoms arise, please call the clinic [phone] or go to the ER immediately if symptoms are severe.  You have done a great job in taking all your medications. Please continue to do this.

## 2016-02-05 ENCOUNTER — Ambulatory Visit: Payer: Self-pay | Admitting: Internal Medicine

## 2016-02-05 LAB — CERVICOVAGINAL ANCILLARY ONLY
Chlamydia: NEGATIVE
Neisseria Gonorrhea: NEGATIVE

## 2016-02-05 NOTE — Assessment & Plan Note (Addendum)
Moderate amount of clear/cloudy discharge for the past few days. NO discomfort. Suspect BV given previous history of this. GC/chlamydia negative. Previous pap normal in 2015.  -Wet prep pending.  -Will treat accordingly.   ADDENDUM: Wet prep positive for Gardnerella. Will treat for BV with Flagyl 500 mg bid for 7 days.

## 2016-02-06 ENCOUNTER — Telehealth: Payer: Self-pay | Admitting: *Deleted

## 2016-02-06 LAB — CERVICOVAGINAL ANCILLARY ONLY: WET PREP (BD AFFIRM): POSITIVE — AB

## 2016-02-06 MED ORDER — METRONIDAZOLE 500 MG PO TABS: 500.0000 mg | ORAL_TABLET | Freq: Two times a day (BID) | ORAL | Status: AC

## 2016-02-06 NOTE — Telephone Encounter (Signed)
Still waiting on wet prep results (ie; Candida, BV, trich), but yes GC/chlamydia was negative. Was waiting for all of these to come back at once so I would only need to call her one time. She did not receive a Pap because she was not due for one until next year.   Natasha Bence, MD PGY-3, Internal Medicine Pager: [phone]

## 2016-02-06 NOTE — Addendum Note (Signed)
Addended byCorky Sox on: 02/06/2016 01:23 PM   Modules accepted: Orders

## 2016-02-06 NOTE — Telephone Encounter (Signed)
Wanting results of Pap smear. Informed patient of negative GC/Chlamydia per your note and lab result. Did not see an order or results for PAP smear. Please advise. Thanks!

## 2016-02-06 NOTE — Progress Notes (Signed)
Internal Medicine Clinic Attending  Case discussed with Dr. Jones at the time of the visit.  We reviewed the resident's history and exam and pertinent patient test results.  I agree with the assessment, diagnosis, and plan of care documented in the resident's note.  

## 2016-02-18 ENCOUNTER — Ambulatory Visit: Payer: Self-pay

## 2016-03-13 ENCOUNTER — Other Ambulatory Visit: Payer: Self-pay | Admitting: Internal Medicine

## 2016-03-14 NOTE — Telephone Encounter (Signed)
Last appt 02/04/16; next appt 7/11.

## 2016-03-25 ENCOUNTER — Ambulatory Visit (INDEPENDENT_AMBULATORY_CARE_PROVIDER_SITE_OTHER): Payer: Self-pay | Admitting: Internal Medicine

## 2016-03-25 ENCOUNTER — Encounter: Payer: Self-pay | Admitting: Internal Medicine

## 2016-03-25 VITALS — BP 146/77 | HR 82 | Temp 98.4°F | Ht 69.0 in | Wt 191.0 lb

## 2016-03-25 DIAGNOSIS — Z0000 Encounter for general adult medical examination without abnormal findings: Secondary | ICD-10-CM

## 2016-03-25 DIAGNOSIS — I10 Essential (primary) hypertension: Secondary | ICD-10-CM

## 2016-03-25 DIAGNOSIS — K5900 Constipation, unspecified: Secondary | ICD-10-CM

## 2016-03-25 DIAGNOSIS — M170 Bilateral primary osteoarthritis of knee: Secondary | ICD-10-CM

## 2016-03-25 MED ORDER — AMLODIPINE BESYLATE 10 MG PO TABS: 10.0000 mg | ORAL_TABLET | Freq: Every day | ORAL | Status: DC

## 2016-03-25 MED ORDER — LOVASTATIN 20 MG PO TABS: 20.0000 mg | ORAL_TABLET | Freq: Every day | ORAL | Status: DC

## 2016-03-25 NOTE — Patient Instructions (Signed)
General Instructions: - Mammogram will be scheduled for next year - Will place referral next month for colonoscopy - Can take Senokot 1-2 times per week to help your constipation  Thank you for bringing your medicines today. This helps [place] keep you safe from mistakes.   Progress Toward Treatment Goals:  Treatment Goal 07/17/2015  Blood pressure unchanged    Self Care Goals & Plans:  Self Care Goal 03/25/2016  Manage my medications take my medicines as prescribed; bring my medications to every visit; refill my medications on time  Monitor my health keep track of my blood pressure  Eat healthy foods eat more vegetables; eat foods that are low in salt; eat baked foods instead of fried foods  Be physically active find an activity I enjoy    No flowsheet data found.   Care Management & Community Referrals:  Referral 02/27/2015  Referrals made for care management support none needed

## 2016-03-25 NOTE — Progress Notes (Signed)
   Subjective:    Patient ID: Cassandra Park, female    DOB: Feb 04, 1961, 55 y.o.   MRN: [id number]  HPI Ms. Wyss is a 55yo woman with PMHx of HTN, OA, and hyperlipidemia who presents today for follow up of her hypertension.  HTN: BP initially 146/77, but then improved to 132/80 on recheck. She is taking Amlodipine 10 mg daily, Coreg 3.125 mg BID, and Maxzide 37.5-25 mg daily.   OA in Knees: Reports she has not had any issues with her OA. Notes she sometimes needs to use a hot pad for pain relief but no other medication needed.  Constipation: Reports using Senokot only once every 2 weeks as needed. However, she reports having 3 bowel movements per week and symptoms of bloating and abdominal discomfort. She wants to know if she can use the Senokot more often.    Review of Systems General: Denies fever, chills, night sweats, changes in weight, changes in appetite HEENT: Denies headaches, ear pain, changes in vision, rhinorrhea, sore throat CV: Denies CP, palpitations, SOB, orthopnea Pulm: Denies SOB, cough, wheezing GI: Denies nausea, vomiting, diarrhea, melena, hematochezia GU: Denies dysuria, hematuria, frequency Msk: Denies muscle cramps, joint pains Neuro: Denies weakness, numbness, tingling Skin: Denies rashes, bruising Psych: Denies depression, anxiety, hallucinations    Objective:   Physical Exam General: alert, sitting up, NAD, pleasant  HEENT: [place]/AT, EOMI, sclera anicteric, mucus membranes moist  CV: RRR, 3/6 systolic murmur heard best at LUSB Pulm: CTA bilaterally, breaths non-labored Abd: BS+, soft, non-tender Ext: warm, no peripheral edema Neuro: alert and oriented x 3    Assessment & Plan:  Please refer to A&P documentation.

## 2016-03-27 NOTE — Assessment & Plan Note (Signed)
Advised patient to start using Senokot at least 1-2 times per week to help alleviate her constipation symptoms. We also discussed eating a high fiber diet. Will follow up next visit.

## 2016-03-27 NOTE — Progress Notes (Signed)
Internal Medicine Clinic Attending  Case discussed with Dr. Rivet soon after the resident saw the patient.  We reviewed the resident's history and exam and pertinent patient test results.  I agree with the assessment, diagnosis, and plan of care documented in the resident's note.  

## 2016-03-27 NOTE — Assessment & Plan Note (Signed)
Patient interested in colonoscopy but she is an orange card patient. I tried to have her do the stool cards but she did not want to do that at this time. I will discuss this further with her as getting her in for colonoscopy will be challenging.

## 2016-03-27 NOTE — Assessment & Plan Note (Signed)
BP well controlled. Continue Amlodipine, Coreg, and Maxzide.

## 2016-03-27 NOTE — Assessment & Plan Note (Signed)
She is having no issues. Continue to monitor.

## 2016-03-31 ENCOUNTER — Telehealth: Payer: Self-pay | Admitting: Internal Medicine

## 2016-03-31 ENCOUNTER — Other Ambulatory Visit: Payer: Self-pay | Admitting: *Deleted

## 2016-03-31 DIAGNOSIS — K5900 Constipation, unspecified: Secondary | ICD-10-CM

## 2016-03-31 MED ORDER — POLYETHYLENE GLYCOL 3350 17 G PO PACK: 17.0000 g | PACK | Freq: Every day | ORAL | Status: DC

## 2016-03-31 NOTE — Telephone Encounter (Signed)
Needs to talk to nurse °

## 2016-03-31 NOTE — Telephone Encounter (Signed)
Yes that is fine. She can use Miralax and/or Senokot. Whatever she prefers to use is ok with me.

## 2016-03-31 NOTE — Telephone Encounter (Signed)
Pt would like to use some miralax, are you ok with this?

## 2016-03-31 NOTE — Telephone Encounter (Signed)
Calling to talk to nurse

## 2016-04-02 ENCOUNTER — Telehealth: Payer: Self-pay

## 2016-04-02 NOTE — Telephone Encounter (Signed)
Requesting the nurse to call back regarding constipation.  

## 2016-04-03 ENCOUNTER — Telehealth: Payer: Self-pay | Admitting: Internal Medicine

## 2016-04-03 NOTE — Telephone Encounter (Signed)
Pt asking for nurse to call regarding constipation

## 2016-04-03 NOTE — Telephone Encounter (Signed)
Pt wanted to let me know that she was not taking enough miralax because she cannot see well, her daughter corrected that and now she is taking the right amount, also she is going to drink warm tea

## 2016-04-03 NOTE — Telephone Encounter (Signed)
She has started using the miralax

## 2016-04-07 ENCOUNTER — Other Ambulatory Visit: Payer: Self-pay | Admitting: Internal Medicine

## 2016-04-07 NOTE — Telephone Encounter (Signed)
amLODipine (NORVASC) 10 MG tablet [place]

## 2016-04-08 NOTE — Telephone Encounter (Signed)
Called health dept back, gave them info to transfer script from [place], spoke to crystal

## 2016-04-08 NOTE — Telephone Encounter (Signed)
Detra from health department requesting amlodipine to be filled for pt. Please call back. Fax number:[phone].

## 2016-04-10 ENCOUNTER — Ambulatory Visit (INDEPENDENT_AMBULATORY_CARE_PROVIDER_SITE_OTHER): Payer: Self-pay | Admitting: Internal Medicine

## 2016-04-10 DIAGNOSIS — I10 Essential (primary) hypertension: Secondary | ICD-10-CM

## 2016-04-10 DIAGNOSIS — R1032 Left lower quadrant pain: Secondary | ICD-10-CM

## 2016-04-10 DIAGNOSIS — M549 Dorsalgia, unspecified: Secondary | ICD-10-CM | POA: Insufficient documentation

## 2016-04-10 DIAGNOSIS — K5900 Constipation, unspecified: Secondary | ICD-10-CM

## 2016-04-10 DIAGNOSIS — M545 Low back pain, unspecified: Secondary | ICD-10-CM

## 2016-04-10 NOTE — Telephone Encounter (Signed)
Calling to talk to Cassandra Park

## 2016-04-10 NOTE — Progress Notes (Signed)
   CC: Back pain and abdominal pain HPI: [name] is a 55 y.o. female with a h/o of allergic rhinitis, hypertension, hyperlipidemia and tobacco abuse who presents with left lower quadrant abdominal pain and back pain. She denies headaches, change in vision, shortness of breath, chest pain, nausea, vomiting or change with her bowel or bladder habits. She has no additional acute complaints or concerns today's visit.  Please see problem-based charting for status of medical issues pertinent to this visit.     Past Medical History:  Diagnosis Date  . Allergic rhinitis   . 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 2 pap smears since then normal. [facility]07 [facility]  . HTN (hypertension)   . Hyperlipidemia   . Keratoconus    f/u @ [facility] eye center. Not needing corneal transplant.   . Osteoarthritis    Left hip  . Substance abuse    tobacco, alcohol  . Vaginal bleeding, abnormal June 2010   Thought to be 2/2 DUB vs endometrial CA. Gyn referral made at that time.   Current Outpatient Rx  . Order #: [id number] Class: Print  . Order #: [id number] Class: Historical Med  . Order #: [id number] Class: Historical Med  . Order #: [id number] Class: Phone In  . Order #: [id number] Class: Normal  . Order #: [id number] Class: Normal  . Order #: [id number] Class: Phone In  . Order #: [id number] Class: Normal    Review of Systems: A complete ROS was negative except as per HPI.  Physical Exam: Vitals:   04/10/16 1532  BP: (!) 158/76  Pulse: 96  Temp: 98.1 F (36.7 C)  TempSrc: Oral  SpO2: 99%  Weight: 192 lb 1.6 oz (87.1 kg)  Height: 5\' 9"  (1.753 m)   BP (!) 158/76 (BP Location: Right Arm, Patient Position: Sitting, Cuff Size: Normal)   Pulse 96   Temp 98.1 F (36.7 C) (Oral)   Ht 5\' 9"  (1.753 m)   Wt 192 lb 1.6 oz (87.1 kg)   LMP 02/28/2012   SpO2 99%   BMI 28.37 kg/m  General appearance: alert and cooperative Head: Normocephalic, without obvious  abnormality, atraumatic Lungs: clear to auscultation bilaterally Heart: Regular rate and rhythm, grade 4 holosystolic murmur heard best at the left upper sternal border with radiation to the carotids Abdomen: soft, non-tender; bowel sounds normal; no masses,  no organomegaly and bowel sounds present, no abdominal bruits auscultated  Assessment & Plan:  See encounters tab for problem based medical decision making. Patient seen with Dr. Daryll Drown  Signed: Ophelia Shoulder, MD 04/10/2016, 4:08 PM  Pager: [phone]

## 2016-04-10 NOTE — Assessment & Plan Note (Signed)
Patient presents with a 2 day history of left-sided lateral back pain. She first noticed this pain after picking up crate of water at [facility]. She said the pain has been improved with ibuprofen. She denies any numbness, tingling or paresthesias. She has no red flag symptoms associated with this pain. Her physical examination was benign. She only has pain when picking up heavy objects and her pain is not present at rest. At this time her pain is most likely explained by a muscle strain from picking up a heavy object. She will not need additional workup or imaging for this pain. -- Start ibuprofen 400 mg 3 times daily for 3 days, then use as needed -- Rest and avoid straining of the muscle -- Apply heat pack as needed for pain

## 2016-04-10 NOTE — Patient Instructions (Signed)
It was a pleasure seeing you today. Thank you for choosing [facility] for your healthcare needs. -- Take gas-x 2-3 until gas improves -- Take Ibuprofen 400 mg three times daily for 3 days and then as needed, Rest, and heat therapy  -- Follow-up with PCP concerning blood pressure -- Try Tums for heart burn, if you have heart burn more than twice per week consider starting PPI

## 2016-04-10 NOTE — Assessment & Plan Note (Signed)
Patient's blood pressure is not at goal. In the office today her blood pressure is 158 /76. I would consider increasing her carvedilol at her next appointment with her PCP. -- follow-up with PCP -- Consider increasing carvedilol

## 2016-04-10 NOTE — Assessment & Plan Note (Signed)
Patient presents with two-day history of left lower quadrant abdominal pain. She says that the pain was improved with laxatives and Gas-X. She has no signs or symptoms concerning for infection. Her abdominal examination was benign. At this time this is most likely secondary to constipation or increased gas.  -- Continue laxatives as needed -- Continue Gas-X

## 2016-04-11 NOTE — Telephone Encounter (Signed)
Verified with pharmacy that this had been taken care of and patient has picked up.

## 2016-04-14 NOTE — Progress Notes (Signed)
Internal Medicine Clinic Attending  I saw and evaluated the patient.  I personally confirmed the key portions of the history and exam documented by Dr. Taylor and I reviewed pertinent patient test results.  The assessment, diagnosis, and plan were formulated together and I agree with the documentation in the resident's note.  

## 2016-04-21 ENCOUNTER — Other Ambulatory Visit: Payer: Self-pay

## 2016-04-21 NOTE — Telephone Encounter (Signed)
Needs to speak with a nurse regarding Miralax.

## 2016-04-23 NOTE — Telephone Encounter (Signed)
Spoke with patient who was wanting to know if she should continue her Miralax because it is giving her gas. Assured patient that that was okay. Patient takes Miralax over summer break because she gets constipated while out of work.

## 2016-05-06 ENCOUNTER — Ambulatory Visit (HOSPITAL_COMMUNITY)
Admission: RE | Admit: 2016-05-06 | Discharge: 2016-05-06 | Disposition: A | Discharge status: Home / Self Care | Payer: Self-pay | Source: Ambulatory Visit | Attending: Family Medicine | Admitting: Family Medicine

## 2016-05-06 ENCOUNTER — Ambulatory Visit (INDEPENDENT_AMBULATORY_CARE_PROVIDER_SITE_OTHER): Payer: Self-pay | Admitting: Internal Medicine

## 2016-05-06 ENCOUNTER — Ambulatory Visit: Payer: Self-pay

## 2016-05-06 ENCOUNTER — Encounter: Payer: Self-pay | Admitting: Internal Medicine

## 2016-05-06 VITALS — BP 154/71 | HR 97 | Temp 98.2°F | Resp 20 | Ht 67.0 in | Wt 187.2 lb

## 2016-05-06 DIAGNOSIS — K219 Gastro-esophageal reflux disease without esophagitis: Secondary | ICD-10-CM | POA: Insufficient documentation

## 2016-05-06 DIAGNOSIS — R0789 Other chest pain: Secondary | ICD-10-CM | POA: Insufficient documentation

## 2016-05-06 DIAGNOSIS — J309 Allergic rhinitis, unspecified: Secondary | ICD-10-CM

## 2016-05-06 MED ORDER — CARVEDILOL 3.125 MG PO TABS: 3.1250 mg | ORAL_TABLET | Freq: Two times a day (BID) | ORAL | 11 refills | Status: DC

## 2016-05-06 MED ORDER — LOVASTATIN 20 MG PO TABS: 20.0000 mg | ORAL_TABLET | Freq: Every day | ORAL | 5 refills | Status: DC

## 2016-05-06 MED ORDER — CETIRIZINE HCL 10 MG PO TABS: 10.0000 mg | ORAL_TABLET | Freq: Every day | ORAL | 2 refills | Status: DC

## 2016-05-06 NOTE — Assessment & Plan Note (Addendum)
Her chest pain does not seem consistent with a cardiac etiology. In office EKG showed normal sinus rhythm with no ST or T wave changes. I suspect her chest pain is likely musculoskeletal from moving heavy furniture recently vs anxiety. She also mentioned GERD-like symptoms so that could be contributing as well. Reassured patient that EKG appeared normal and to return to clinic if the pain worsens. Recommended for her to pay attention to her GERD-like symptoms. May have to consider restarting PPI if symptoms are occurring frequently.

## 2016-05-06 NOTE — Assessment & Plan Note (Signed)
Will have her start Cetirizine 10 mg daily. Follow up symptoms at next visit.

## 2016-05-06 NOTE — Progress Notes (Signed)
   CC: Chest pain  HPI:  [name] is a 55 y.o. woman with PMHx as noted below who presents today for chest pain.  Chest Pain: She recently had several teeth extracted and was prescribed penicillin and Norco for pain. She had taken Norco about 4 days ago and then developed chest pain. Initially the pain was on the right side of her chest but then subsequently moved to the middle and then left side today. She describes the pain as 2/10 in severity, achy, intermittent, lasting only for a few minutes at a time, and she had radiation to the right side of her neck but this was only once. She describes associated pain in her shoulders as well. She denies any associated SOB, diaphoresis, or nausea. She states her right arm became numb about 2 days ago but this quickly resolved. She notes she also moved heavy furniture (sofas, tables) about 3-4 days ago. She denies any weakness in her extremities, slurred speech, or vision changes.   Allergic Rhinitis: Reports having runny nose and itchy watery eyes. She notes she was on Allegra previously which helped her symptoms but that it was too expensive other the counter. She wants to know about other options.  GERD: Reports experiencing a sour taste in her mouth and burning epigastric pain occasionally. She stopped taking her Protonix as she was instructed to by a physician.  Past Medical History:  Diagnosis Date  . Allergic rhinitis   . 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 2 pap smears since then normal. [facility]/07 [facility]  . HTN (hypertension)   . Hyperlipidemia   . 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   . Osteoarthritis    Left hip  . Substance abuse    tobacco, alcohol  . Vaginal bleeding, abnormal June 2010   Thought to be 2/2 DUB vs endometrial CA. Gyn referral made at that time.    Review of Systems:  All negative except per HPI.  Physical Exam:  Vitals:   05/06/16  1612  BP: (!) 154/71  Pulse: 97  Resp: 20  Temp: 98.2 F (36.8 C)  TempSrc: Oral  SpO2: 99%  Weight: 187 lb 3.2 oz (84.9 kg)  Height: 5\' 7"  (1.702 m)   General: well nourished, well developed woman sitting up, pleasant, appears anxious HEENT: [place]/AT, EOMI, sclera anicteric, mucus membranes moist CV: RRR, 3/6 systolic murmur heard best at LUSB. No tenderness elicited with palpation to the chest. Pulm: CTA bilaterally, breaths non-labored Abd: BS+, soft, non-tender, non-distended Ext: warm, no peripheral edema  Neuro: alert and oriented x 3. CNs II-XII grossly intact. Strength 5/5 in upper and lower extremities bilaterally.  Assessment & Plan:   See Encounters Tab for problem based charting.  Patient discussed with Dr. Daryll Drown

## 2016-05-06 NOTE — Patient Instructions (Addendum)
General Instructions: - Your chest pain is likely due to anxiety or musculoskeletal pain - Pay attention to how often you are getting acid reflux symptoms (burning abdominal pain, sour taste in mouth). We may need to restart your acid reflux medicine.   Thank you for bringing your medicines today. This helps [place] keep you safe from mistakes.   Progress Toward Treatment Goals:  Treatment Goal 07/17/2015  Blood pressure unchanged  Stop smoking -    Self Care Goals & Plans:  Self Care Goal 05/06/2016  Manage my medications take my medicines as prescribed; bring my medications to every visit; refill my medications on time  Monitor my health keep track of my blood pressure  Eat healthy foods eat foods that are low in salt; eat baked foods instead of fried foods  Be physically active find an activity I enjoy  Stop smoking -    No flowsheet data found.   Care Management & Community Referrals:  Referral 02/27/2015  Referrals made for care management support none needed

## 2016-05-07 ENCOUNTER — Telehealth: Payer: Self-pay | Admitting: *Deleted

## 2016-05-07 DIAGNOSIS — I10 Essential (primary) hypertension: Secondary | ICD-10-CM

## 2016-05-07 NOTE — Telephone Encounter (Signed)
Call from [facility] - they do not have Mevacor 20 mg but carry Simvastatin 10 mg which is the equivalent. And new rx can be electronic sent. Thanks

## 2016-05-09 NOTE — Progress Notes (Signed)
Internal Medicine Clinic Attending  Case discussed with Dr. Rivet soon after the resident saw the patient.  We reviewed the resident's history and exam and pertinent patient test results.  I agree with the assessment, diagnosis, and plan of care documented in the resident's note.  

## 2016-05-10 ENCOUNTER — Other Ambulatory Visit: Payer: Self-pay | Admitting: Internal Medicine

## 2016-05-10 DIAGNOSIS — E785 Hyperlipidemia, unspecified: Secondary | ICD-10-CM

## 2016-05-10 MED ORDER — SIMVASTATIN 10 MG PO TABS: 10.0000 mg | ORAL_TABLET | Freq: Every day | ORAL | 5 refills | Status: DC

## 2016-05-10 NOTE — Telephone Encounter (Signed)
Ok great. Rx for simvastatin sent in. Thanks.

## 2016-07-25 ENCOUNTER — Telehealth: Payer: Self-pay

## 2016-07-25 MED ORDER — OMEPRAZOLE 40 MG PO CPDR: 40.0000 mg | DELAYED_RELEASE_CAPSULE | Freq: Every day | ORAL | 1 refills | Status: DC

## 2016-07-25 NOTE — Telephone Encounter (Signed)
Needs to speak with a nurse regarding meds.  

## 2016-07-25 NOTE — Telephone Encounter (Signed)
Pt states she has been trying Tums for heartburn but it is getting worse. States she had taken OTC med for this problem but cannot remember the name. Wants to know name of med. Thanks

## 2016-07-25 NOTE — Telephone Encounter (Signed)
Will send in prescription for Prilosec and will see if this helps her problem. Will follow up at her next visit.

## 2016-07-31 ENCOUNTER — Ambulatory Visit: Payer: Self-pay

## 2016-08-06 ENCOUNTER — Ambulatory Visit: Payer: Self-pay

## 2016-09-01 ENCOUNTER — Telehealth: Payer: Self-pay

## 2016-09-01 NOTE — Telephone Encounter (Signed)
Yes that is perfectly fine. She can take daily if needed.

## 2016-09-01 NOTE — Telephone Encounter (Signed)
Please call pt back regarding meds.  

## 2016-09-01 NOTE — Telephone Encounter (Signed)
Pt states that she is having to take miralax every 2-3 days in order to keep from getting constipated.  She states that is works well, but would like to know if it's ok to take it like that.  She prefers the miralax over the senokot.  Will send to pcp for review, please advise if its ok for pt to take so frequently long-term.Cassandra Hidden Cassady12/18/201711:19 AM

## 2016-09-09 ENCOUNTER — Other Ambulatory Visit: Payer: Self-pay | Admitting: Internal Medicine

## 2016-09-16 NOTE — Telephone Encounter (Signed)
Patient calling about refill of Triamterene-HCTZ 37.5-25 MG  [facility] [place]

## 2016-09-17 ENCOUNTER — Other Ambulatory Visit: Payer: Self-pay | Admitting: Internal Medicine

## 2016-09-17 NOTE — Telephone Encounter (Signed)
Left patient message that I called pharmacy regarding refill they have rx was backed up yesterday will have ready ASAP

## 2016-09-17 NOTE — Telephone Encounter (Signed)
Pt requesting medication refill   triamterene-hydrochlorothiazide (MAXZIDE-25) 37.5-25 MG tablet

## 2016-09-26 ENCOUNTER — Other Ambulatory Visit: Payer: Self-pay

## 2016-09-26 DIAGNOSIS — I10 Essential (primary) hypertension: Secondary | ICD-10-CM

## 2016-09-26 NOTE — Telephone Encounter (Signed)
amLODipine (NORVASC) 10 MG tablet, refill request @ [facility].

## 2016-09-28 MED ORDER — AMLODIPINE BESYLATE 10 MG PO TABS: 10.0000 mg | ORAL_TABLET | Freq: Every day | ORAL | 5 refills | Status: DC

## 2016-09-30 NOTE — Telephone Encounter (Signed)
[facility] pharmacy informed of refill.

## 2016-10-03 ENCOUNTER — Telehealth: Payer: Self-pay

## 2016-10-03 NOTE — Telephone Encounter (Signed)
Patient called reports she feels something moving around in her left side of chest near rib cage. She denies SOB, pain in chest, jaw, face, neck or arms, denies N&V, denies sweating or feeling clammy . Reports she feel like it is gas but just wanted to be sure. Advised patient to take Tums or Rolaids to call nurse back in 45 min. To call 911 If any of the above symptoms occur or go ER.

## 2016-10-03 NOTE — Telephone Encounter (Signed)
Patient called back reports that TUMS helped symptoms are gone. No further concerns at this time

## 2016-10-06 ENCOUNTER — Ambulatory Visit: Payer: Self-pay

## 2016-10-06 ENCOUNTER — Ambulatory Visit (INDEPENDENT_AMBULATORY_CARE_PROVIDER_SITE_OTHER): Payer: Self-pay | Admitting: Internal Medicine

## 2016-10-06 ENCOUNTER — Ambulatory Visit (HOSPITAL_COMMUNITY)
Admission: RE | Admit: 2016-10-06 | Discharge: 2016-10-06 | Disposition: A | Discharge status: Home / Self Care | Payer: Self-pay | Source: Ambulatory Visit | Attending: Internal Medicine | Admitting: Internal Medicine

## 2016-10-06 VITALS — BP 150/74 | HR 116 | Temp 98.1°F | Ht 67.0 in | Wt 205.7 lb

## 2016-10-06 DIAGNOSIS — Z9181 History of falling: Secondary | ICD-10-CM

## 2016-10-06 DIAGNOSIS — R0789 Other chest pain: Secondary | ICD-10-CM

## 2016-10-06 DIAGNOSIS — R1032 Left lower quadrant pain: Secondary | ICD-10-CM

## 2016-10-06 DIAGNOSIS — R079 Chest pain, unspecified: Secondary | ICD-10-CM | POA: Insufficient documentation

## 2016-10-06 DIAGNOSIS — R000 Tachycardia, unspecified: Secondary | ICD-10-CM

## 2016-10-06 MED ORDER — SIMETHICONE 80 MG PO CHEW: 80.0000 mg | CHEWABLE_TABLET | Freq: Four times a day (QID) | ORAL | 0 refills | Status: DC | PRN

## 2016-10-06 MED ORDER — PANTOPRAZOLE SODIUM 40 MG PO TBEC: 40.0000 mg | DELAYED_RELEASE_TABLET | Freq: Two times a day (BID) | ORAL | 0 refills | Status: DC

## 2016-10-06 NOTE — Progress Notes (Signed)
   CC: Chest Pain  HPI:  [name] is a 56 y.o. female with a past medical history listed below here today with complaints of chest pain.  She says she has been having chest pain/abdominal pain since last Wednesday. Describes it as a bloating discomfort underneath her left rib cage in her LUQ. She started taking omeprazole and Tums at that time with some relief. Denies any sharp pain or pressure. The pain does move around her left chest and upper abdomen. Notes pain is constant. The first day the pain was radiating to her left shoulder but not since. Has been passing lots of gas and symptoms improve after passing gas. All of her symptoms have resolved today. No association with meals.  No SOB, jaw pain, nausea, vomiting, diaphoresis.    Does note a fall 2 weeks ago on her left side.   Past Medical History:  Diagnosis Date  . Allergic rhinitis   . 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 2 pap smears since then normal. [facility]  . HTN (hypertension)   . Hyperlipidemia   . 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 Not needing corneal transplant.   . Osteoarthritis    Left hip  . Substance abuse    tobacco, alcohol  . Vaginal bleeding, abnormal June 2010   Thought to be 2/2 DUB vs endometrial CA. Gyn referral made at that time.    Review of Systems:   Negative except as noted in HPI  Physical Exam:  Vitals:   10/06/16 1551  BP: (!) 150/74  Pulse: (!) 116  Temp: 98.1 F (36.7 C)  TempSrc: Oral  SpO2: 99%  Weight: 205 lb 11.2 oz (93.3 kg)  Height: 5\' 7"  (1.702 m)   Physical Exam  Constitutional: She is well-developed, well-nourished, and in no distress. No distress.  HENT:  Head: Normocephalic and atraumatic.  Cardiovascular: Regular rhythm.  Exam reveals no gallop and no friction rub.   Murmur heard. tachycardic  Pulmonary/Chest: Effort normal and breath sounds normal. No respiratory distress. She has no wheezes. She has no  rales. She exhibits no tenderness.  Abdominal: Soft. Bowel sounds are normal. She exhibits no distension. There is no tenderness. There is no rebound and no guarding.  Skin: Skin is warm and dry.  Psychiatric: Mood and affect normal.    Assessment & Plan:   See Encounters Tab for problem based charting.  Patient discussed with Dr. Evette Doffing

## 2016-10-06 NOTE — Patient Instructions (Addendum)
Cassandra Park,  Your EKG today looked good and I do not believe your pain is coming from your heart. It does sound like you are having pain from gas. I am going to give you a medication called simethicone for this gas pain. You can take it every 6 hours as needed. Please take it with meals.   I would also like you to take Protonix 40 mg twice a day for the next month and follow up in clinic.  If your symptoms do not improve int he next 1-2 weeks, please call the clinic.

## 2016-10-07 ENCOUNTER — Ambulatory Visit: Payer: Self-pay

## 2016-10-07 NOTE — Assessment & Plan Note (Signed)
  She says she has been having chest pain/abdominal pain since last Wednesday. Describes it as a bloating discomfort underneath her left rib cage in her LUQ. She started taking omeprazole and Tums at that time with some relief. Denies any sharp pain or pressure. The pain does move around her left chest and upper abdomen. Notes pain is constant. The first day the pain was radiating to her left shoulder but not since. Has been passing lots of gas and symptoms improve after passing gas. All of her symptoms have resolved today. No association with meals.  No SOB, jaw pain, nausea, vomiting, diaphoresis.    Does note a fall 2 weeks ago on her left side.   A/P EKG today with sinus tachycaric and no ischemic changes  Will treat with protonix 40 mg bid x 1 month and simethicone 80 mg q6 hr prn

## 2016-10-07 NOTE — Progress Notes (Signed)
Internal Medicine Clinic Attending  Case discussed with Dr. Boswell at the time of the visit.  We reviewed the resident's history and exam and pertinent patient test results.  I agree with the assessment, diagnosis, and plan of care documented in the resident's note.  

## 2016-10-08 ENCOUNTER — Telehealth: Payer: Self-pay | Admitting: Internal Medicine

## 2016-10-08 NOTE — Telephone Encounter (Signed)
I agree with plan. She can try Senokot first and if does not help then add Miralax once daily.

## 2016-10-08 NOTE — Telephone Encounter (Signed)
Pt called back - asking if it's ok to take Senokot for constipation,which is on her med list - informed her, this should be ok. And if Senokot does not work, she can try Miralax which is on her med list also. But to try one at a time so she does not dev diarrhea. Voiced understanding.

## 2016-10-08 NOTE — Telephone Encounter (Signed)
Calling for advice on constipation.

## 2016-11-04 ENCOUNTER — Ambulatory Visit (INDEPENDENT_AMBULATORY_CARE_PROVIDER_SITE_OTHER): Payer: Self-pay | Admitting: Internal Medicine

## 2016-11-04 ENCOUNTER — Encounter: Payer: Self-pay | Admitting: Internal Medicine

## 2016-11-04 VITALS — BP 141/72 | HR 84 | Temp 98.0°F | Ht 67.0 in | Wt 201.2 lb

## 2016-11-04 DIAGNOSIS — E785 Hyperlipidemia, unspecified: Secondary | ICD-10-CM

## 2016-11-04 DIAGNOSIS — Z87891 Personal history of nicotine dependence: Secondary | ICD-10-CM

## 2016-11-04 DIAGNOSIS — I10 Essential (primary) hypertension: Secondary | ICD-10-CM

## 2016-11-04 DIAGNOSIS — Z0000 Encounter for general adult medical examination without abnormal findings: Secondary | ICD-10-CM

## 2016-11-04 DIAGNOSIS — R358 Other polyuria: Secondary | ICD-10-CM

## 2016-11-04 DIAGNOSIS — K219 Gastro-esophageal reflux disease without esophagitis: Secondary | ICD-10-CM

## 2016-11-04 DIAGNOSIS — R3589 Other polyuria: Secondary | ICD-10-CM

## 2016-11-04 DIAGNOSIS — Z1211 Encounter for screening for malignant neoplasm of colon: Secondary | ICD-10-CM

## 2016-11-04 DIAGNOSIS — Z79899 Other long term (current) drug therapy: Secondary | ICD-10-CM

## 2016-11-04 LAB — GLUCOSE, CAPILLARY: Glucose-Capillary: 122 mg/dL — ABNORMAL HIGH (ref 65–99)

## 2016-11-04 LAB — POCT GLYCOSYLATED HEMOGLOBIN (HGB A1C): Hemoglobin A1C: 6.4

## 2016-11-04 MED ORDER — LOVASTATIN 20 MG PO TABS: 20.0000 mg | ORAL_TABLET | Freq: Every day | ORAL | 5 refills | Status: DC

## 2016-11-04 MED ORDER — PANTOPRAZOLE SODIUM 40 MG PO TBEC: 40.0000 mg | DELAYED_RELEASE_TABLET | Freq: Two times a day (BID) | ORAL | 2 refills | Status: DC

## 2016-11-04 NOTE — Patient Instructions (Signed)
General Instructions: - Can decrease Protonix to 40 mg daily. If your symptoms return, then go back up to 40 mg twice daily - Referral to gastroenterology made for colonoscopy. Be on [facility] for their call to schedule an appointment - We will check blood work today  Thank you for bringing your medicines today. This helps [place] keep you safe from mistakes.   Progress Toward Treatment Goals:  Treatment Goal 07/17/2015  Blood pressure unchanged  Stop smoking -    Self Care Goals & Plans:  Self Care Goal 11/04/2016  Manage my medications take my medicines as prescribed; refill my medications on time; bring my medications to every visit  Monitor my health keep track of my blood pressure  Eat healthy foods eat more vegetables; eat foods that are low in salt; eat baked foods instead of fried foods  Be physically active find an activity I enjoy  Stop smoking -    No flowsheet data found.   Care Management & Community Referrals:  Referral 02/27/2015  Referrals made for care management support none needed

## 2016-11-04 NOTE — Progress Notes (Signed)
   CC: Hypertension follow up  HPI:  [name] is a 56 y.o. woman with PMHx as noted below who presents today for follow up of her hypertension.   HTN: BP elevated at 158/77. Repeat BP 141/72. She is taking Amlodipine 10 mg daily, Coreg 3.125 mg BID, and Maxzide 37.5-25 mg daily.   GERD: She was evaluated for atypical chest pain 1 month ago. She reports the Protonix 40 mg BID and Simethicone have completely resolved her pain. She describes foods such as spaghetti and salad dressings eliciting her pain. She has avoided any foods that tend to precipitate her symptoms.   Polyuria: She describes urinating more frequently over the last 3 months. She also describes having nocturia. Denies any dysuria. She is concerned she may have diabetes. She denies blurry vision and polydipsia.   Hyperlipidemia: She has been stable on Lovastatin 20 mg daily. Denies muscle cramps.   Health Maintenance: Reports she would like to get a colonoscopy. She has lost 4 lbs in the last few months but attributes this to eating healthier. She denies any melena or hematochezia. She has only done stool cards, never had a colonoscopy before.     Past Medical History:  Diagnosis Date  . Allergic rhinitis   . 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 2 pap smears since then normal. [facility]  . HTN (hypertension)   . Hyperlipidemia   . 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 Not needing corneal transplant.   . Osteoarthritis    Left hip  . Substance abuse    tobacco, alcohol  . Vaginal bleeding, abnormal June 2010   Thought to be 2/2 DUB vs endometrial CA. Gyn referral made at that time.    Review of Systems:   General: Denies fever, chills, night sweats, changes in appetite HEENT: Denies headaches, ear pain, rhinorrhea, sore throat CV: Denies palpitations, SOB, orthopnea Pulm: Denies SOB, cough, wheezing GI: Denies nausea, vomiting, diarrhea, constipation GU: Denies  dysuria, hematuria Msk: Denies muscle cramps, joint pains Neuro: Denies weakness, numbness, tingling Skin: Denies rashes, bruising Psych: Denies depression, anxiety, hallucinations  Physical Exam:  Vitals:   11/04/16 1526  BP: (!) 158/77  Pulse: 94  Temp: 98 F (36.7 C)  TempSrc: Oral  SpO2: 100%  Weight: 201 lb 3.2 oz (91.3 kg)  Height: 5\' 7"  (1.702 m)   Repeat BP 141/72  General: alert, sitting up, NAD HEENT: [place]/AT, EOMI, sclera anicteric, mucus membranes moist CV: RRR, 3/6 systolic murmur best heard at RUSB Pulm: CTA bilaterally, breaths non-labored Abd: BS+, soft, non-tender Ext: 1+ peripheral edema bilaterally Neuro: alert and oriented x 3  Assessment & Plan:   See Encounters Tab for problem based charting.  Patient discussed with Dr. Eppie Gibson

## 2016-11-05 LAB — CBC WITH DIFFERENTIAL/PLATELET
BASOS: 0 %
Basophils Absolute: 0 10*3/uL (ref 0.0–0.2)
EOS (ABSOLUTE): 0.2 10*3/uL (ref 0.0–0.4)
Eos: 3 %
Hematocrit: 39.2 % (ref 34.0–46.6)
Hemoglobin: 13.2 g/dL (ref 11.1–15.9)
Immature Grans (Abs): 0 10*3/uL (ref 0.0–0.1)
Immature Granulocytes: 0 %
Lymphocytes Absolute: 2.5 10*3/uL (ref 0.7–3.1)
Lymphs: 37 %
MCH: 29.6 pg (ref 26.6–33.0)
MCHC: 33.7 g/dL (ref 31.5–35.7)
MCV: 88 fL (ref 79–97)
MONOS ABS: 0.5 10*3/uL (ref 0.1–0.9)
Monocytes: 7 %
NEUTROS ABS: 3.7 10*3/uL (ref 1.4–7.0)
Neutrophils: 53 %
PLATELETS: 285 10*3/uL (ref 150–379)
RBC: 4.46 x10E6/uL (ref 3.77–5.28)
RDW: 14 % (ref 12.3–15.4)
WBC: 6.9 10*3/uL (ref 3.4–10.8)

## 2016-11-05 LAB — CMP14 + ANION GAP
A/G RATIO: 1.7 (ref 1.2–2.2)
ALT: 18 IU/L (ref 0–32)
AST: 19 IU/L (ref 0–40)
Albumin: 4.6 g/dL (ref 3.5–5.5)
Alkaline Phosphatase: 80 IU/L (ref 39–117)
Anion Gap: 19 — ABNORMAL HIGH (ref 10.0–18.0)
BILIRUBIN TOTAL: 0.3 mg/dL (ref 0.0–1.2)
BUN/Creatinine Ratio: 16 (ref 9–23)
BUN: 11 mg/dL (ref 6–24)
CALCIUM: 9.5 mg/dL (ref 8.7–10.2)
CHLORIDE: 96 mmol/L (ref 96–106)
CO2: 25 mmol/L (ref 18–29)
Creatinine, Ser: 0.7 mg/dL (ref 0.57–1.00)
GFR, EST AFRICAN AMERICAN: 110 (ref >59)
GFR, EST NON AFRICAN AMERICAN: 96 (ref >59)
Globulin, Total: 2.7 (ref 1.5–4.5)
Glucose: 117 mg/dL — ABNORMAL HIGH (ref 65–99)
POTASSIUM: 3.7 mmol/L (ref 3.5–5.2)
Sodium: 140 mmol/L (ref 134–144)
TOTAL PROTEIN: 7.3 g/dL (ref 6.0–8.5)

## 2016-11-06 DIAGNOSIS — E119 Type 2 diabetes mellitus without complications: Secondary | ICD-10-CM | POA: Insufficient documentation

## 2016-11-06 NOTE — Assessment & Plan Note (Addendum)
Pt with 3 month hx polyuria. Will check an A1c as she has had elevated blood glucose readings on prior bmets.

## 2016-11-06 NOTE — Assessment & Plan Note (Signed)
Her atypical chest/abdominal pain seems most consistent with GERD as Protonix and Simethicone relieved her symptoms. Advised patient to decrease Protonix to 40 mg daily instead of BID and see if her symptoms are controlled on once daily dosing. She was advised to increase back to BID dosing if symptoms return.

## 2016-11-06 NOTE — Assessment & Plan Note (Signed)
Referral placed to GI for screening colonoscopy. Will check CBC to ensure not anemic. She has not had any evidence of GI bleeding.

## 2016-11-06 NOTE — Assessment & Plan Note (Signed)
Stable. Continue Lovastatin 20 mg daily. Check CMET today.

## 2016-11-06 NOTE — Assessment & Plan Note (Addendum)
BP initially elevated, but repeat BP acceptable. Will continue on her regimen of Amlodipine 10 mg daily, Coreg 3.125 mg BID, and Maxzide 37.5-25 mg daily. Can consider titrating up her Coreg at next visit if BP still elevated. Check CMET today.

## 2016-11-06 NOTE — Progress Notes (Signed)
Case discussed with Dr. Rivet soon after the resident saw the patient. We reviewed the resident's history and exam and pertinent patient test results. I agree with the assessment, diagnosis, and plan of care documented in the resident's note. 

## 2016-11-10 ENCOUNTER — Telehealth: Payer: Self-pay

## 2016-11-10 NOTE — Telephone Encounter (Signed)
Needs to speak with a nurse regarding acid reflux. Please call back.

## 2016-11-10 NOTE — Telephone Encounter (Signed)
She had expressed to me last visit that salad dressings exacerbate her symptoms. Would recommend to her to avoid salad dressing for now. She can increase her Protonix back up to 40 mg twice daily if needed. If symptoms continue we can refer to GI for EGD. Thanks for update.

## 2016-11-10 NOTE — Telephone Encounter (Signed)
Pt states her acid reflux has been "acting up", states sat night she went out to eat and had salad with lot of dressing, baked potato w/ lots of butter and yesterday and today baked beans and fish sticks, she states she keeps having the burning that comes w/ her acid reflux and what can she do about it, she denies chest pain, shortness of breath, weakness, h/a. She states she will use her protonix and gas ex as directed, change her diet to more bland foods and call wed if she is not feeling better, she is agreeable if any additional problems begin to call 911 or come to ED

## 2016-11-12 NOTE — Telephone Encounter (Signed)
Lm for rtc 

## 2016-11-12 NOTE — Telephone Encounter (Signed)
Checked w/ pt today, she states she is a little better but still more gas than normal, instructed about protonix, she will call back if she desires appt.

## 2016-11-25 ENCOUNTER — Telehealth: Payer: Self-pay | Admitting: Internal Medicine

## 2016-11-25 NOTE — Telephone Encounter (Signed)
Would like lab results

## 2016-11-26 NOTE — Telephone Encounter (Signed)
Pt calling again.

## 2016-11-27 ENCOUNTER — Other Ambulatory Visit: Payer: Self-pay | Admitting: Internal Medicine

## 2016-11-27 DIAGNOSIS — R7303 Prediabetes: Secondary | ICD-10-CM

## 2016-11-27 NOTE — Telephone Encounter (Signed)
Patient calling again for her test results.

## 2016-11-27 NOTE — Telephone Encounter (Signed)
Request sent to Dr Arcelia Jew.

## 2016-11-27 NOTE — Telephone Encounter (Signed)
Discussed lab results with patient. She is going to try lifestyle modifications first for her elevated HbA1c (prediabetes). Will send message to front desk to schedule appointment in May for repeat A1c. Will also contact Butch Penny to schedule nutrition/DM education appointment.

## 2016-12-02 ENCOUNTER — Telehealth: Payer: Self-pay | Admitting: Dietician

## 2016-12-02 NOTE — Telephone Encounter (Signed)
Left patient a message confirming appointment for April 3rd at 1015 am.

## 2016-12-16 ENCOUNTER — Encounter (INDEPENDENT_AMBULATORY_CARE_PROVIDER_SITE_OTHER): Payer: Self-pay

## 2016-12-16 ENCOUNTER — Ambulatory Visit (INDEPENDENT_AMBULATORY_CARE_PROVIDER_SITE_OTHER): Payer: Self-pay | Admitting: Dietician

## 2016-12-16 DIAGNOSIS — Z6830 Body mass index (BMI) 30.0-30.9, adult: Secondary | ICD-10-CM

## 2016-12-16 DIAGNOSIS — R7303 Prediabetes: Secondary | ICD-10-CM

## 2016-12-16 DIAGNOSIS — Z713 Dietary counseling and surveillance: Secondary | ICD-10-CM

## 2016-12-16 NOTE — Patient Instructions (Addendum)
Changes that can help prevent Type 2 diabetes to do in the next 2-4 weeks  1. Walk more- track your steps - goal is eventually 10,000 steps a day- your weekends are low, consider a 30 minute walk on weekends over the next few weeks  2. Cut back calories and sugar- try diet ginger ale instead of regular.

## 2016-12-16 NOTE — Progress Notes (Signed)
  Medical Nutrition Therapy:  Appt start time: 3009 end time:  1120. Visit # 1 of a series of 3-5 visits  Assessment:  Primary concerns today: prediabetes.  "I want to do what's right".  Cassandra Park reports she has reflux and has changed her diet because of that recently. She cut out salads and is using more [place] meat for hamburgers and sausage.  She does not drive.  She has friend and 2 supportive daughters here in town. We set up her smart phone to track her steps and she verbalized understanding about how it works. She used to have constipation but not a problem recently.   Preferred Learning Style:No preference indicated  Learning Readiness: Ready and Change in progress  ANTHROPOMETRICS: weight-194.5#, height-69", BMI-30.46, IBW- 145-159# WEIGHT HISTORY: Highest: now Lowest-140-150 in her 20s and 30s SLEEP:11-5 am on workdays, reports restless sleep, watches Tv until 1030 PM  Dining Out (times/week): 3 or more times a week MEDICATIONS: noted BLOOD SUGAR:A1C was 6.4% DIETARY INTAKE: Usual eating pattern includes 2 meals and 2-3 snacks per day. .   24-hr recall:  B ( AM): fruit,water, regular bottle of ginger ale between when she wakes and lunch L ( PM): eats at work ( school) chicken pot pie and potato with butter she brings from home - I can't believe it's not butter, just switched to tub Snk ( PM): lay's potato chips, candy or cookies D ( PM): out 3 times a week- [employer], fish and fries or roast beef home eats a sub with mustard, ham and cheese, [employer] or [employer] Snack- candy finishes pack of necco wafers Beverages: water, regular gingerale Usual physical activity: ADLs and work- her steps per her phone were ~ 5000 on workdays and ~ 1000 on weekends days  Estimated daily energy needs for weight loss: 1700-1800 calories 70-80 g protein  Progress Towards Goal(s):  In progress.   Nutritional Diagnosis:  NB-1.1 Food and nutrition-related knowledge deficit As related to new  diagnosis of prediabetes.  As evidenced by her a1C and report.    Intervention:  Nutrition education about prediabetes and how to prevent type 2 diabetes emphasizing increased nutrient density and more activity. Goal weight in 180s ~ 185#.  Coordination of care: none at this time  Teaching Method Utilized: Visual, Auditory and Hands on Handouts given during visit include:AVS, about predioabetes Barriers to learning/adherence to lifestyle change: competing factors Demonstrated degree of understanding via:  Teach Back   Monitoring/Evaluation:  Dietary intake, exercise, and body weight 4 weeks.

## 2016-12-19 ENCOUNTER — Encounter: Payer: Self-pay | Admitting: Internal Medicine

## 2016-12-30 ENCOUNTER — Telehealth: Payer: Self-pay | Admitting: *Deleted

## 2016-12-30 ENCOUNTER — Other Ambulatory Visit: Payer: Self-pay | Admitting: Internal Medicine

## 2016-12-30 DIAGNOSIS — I10 Essential (primary) hypertension: Secondary | ICD-10-CM

## 2016-12-30 DIAGNOSIS — R0789 Other chest pain: Secondary | ICD-10-CM

## 2016-12-30 DIAGNOSIS — K219 Gastro-esophageal reflux disease without esophagitis: Secondary | ICD-10-CM

## 2016-12-30 MED ORDER — PANTOPRAZOLE SODIUM 40 MG PO TBEC: 40.0000 mg | DELAYED_RELEASE_TABLET | Freq: Two times a day (BID) | ORAL | 2 refills | Status: DC

## 2016-12-30 MED ORDER — AMLODIPINE BESYLATE 10 MG PO TABS: 10.0000 mg | ORAL_TABLET | Freq: Every day | ORAL | 5 refills | Status: DC

## 2016-12-30 MED ORDER — CARVEDILOL 3.125 MG PO TABS: 3.1250 mg | ORAL_TABLET | Freq: Two times a day (BID) | ORAL | 11 refills | Status: DC
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
## 2016-12-30 NOTE — Telephone Encounter (Signed)
Amlodipine & carvedilol called in to [place].

## 2017-01-13 ENCOUNTER — Encounter: Payer: Self-pay | Admitting: Dietician

## 2017-01-13 ENCOUNTER — Ambulatory Visit (INDEPENDENT_AMBULATORY_CARE_PROVIDER_SITE_OTHER): Payer: Self-pay | Admitting: Dietician

## 2017-01-13 DIAGNOSIS — E663 Overweight: Secondary | ICD-10-CM

## 2017-01-13 DIAGNOSIS — R7303 Prediabetes: Secondary | ICD-10-CM

## 2017-01-13 DIAGNOSIS — Z6830 Body mass index (BMI) 30.0-30.9, adult: Secondary | ICD-10-CM

## 2017-01-13 DIAGNOSIS — Z713 Dietary counseling and surveillance: Secondary | ICD-10-CM

## 2017-01-13 NOTE — Patient Instructions (Addendum)
Please write down all food and drinks that you have in the next two weeks with time you ate them and if you'd like calories, but calories is not mandatory.  Keep up the walking! Consider activity on weekends. Measure it with phone.   See you in two weeks!

## 2017-01-13 NOTE — Progress Notes (Signed)
  Medical Nutrition Therapy:  Appt start time: 0947 end time:  1550. Visit # 2   Assessment:  Primary concerns today: prediabetes.  "I want to lose this weight". Cassandra Park tracked her steps and was successful at adding in a 10 minute walk on workdays, walking 6000-1000 steps 5 days a week.  She does not carry her phone with her on weekends and mostly stays in so her steps were low on those days.  She realized that her soda she usually drinks is diet sierra mist  Her plan for  Weight loss is to snack on fruit or vegetables instead of eating junk foods like candy, cookies and chips to help her decrease her weight and prevent diabetes. She agreed to track her food intake  ANTHROPOMETRICS: weight-199.0#, height-69", BMI-30.46, IBW- 145-159# SLEEP:11-5 am on workdays, reports restless sleep, watches Tv until 1030 PM  DIETARY INTAKE: Usual eating pattern includes 2 meals and 2-3 snacks per day. .   24-hr recall:  B (AM): nothing or chips L ( PM):ribs, mashed potatoes,biscuit Snk (PM): lay's potato chips, candy or cookies D (PM): out 3 times a week- ribs, macaroni & cheese, greens Snack- candy finishes pack of necco wafers Beverages: water, diet soda Usual physical activity: ADLs and work- her steps per her phone were > 6k/day on workdays  Estimated daily energy needs for weight loss: 1700-1800 calories 70-80 g protein  Progress Towards Goal(s):  In progress.   Nutritional Diagnosis:  NB-1.1 Food and nutrition-related knowledge deficit gradually improving As related to new diagnosis of prediabetes As evidenced by her a1C and report.    Intervention:  Nutrition education about how to keep a food record, ideas for healthier snacks. Goal weight in 180s ~ 185#.  Coordination of care: none at this time  Teaching Method Utilized: Visual, Auditory and Hands on Handouts given during visit include:AVS, about predioabetes Barriers to learning/adherence to lifestyle change: competing  factors Demonstrated degree of understanding via:  Teach Back   Monitoring/Evaluation:  Dietary intake, exercise, and body weight 4 weeks. Cassandra Park, Cassandra Park, Cassandra Park 01/13/2017 4:10 PM.

## 2017-01-27 ENCOUNTER — Ambulatory Visit: Payer: Self-pay | Admitting: Dietician

## 2017-01-28 ENCOUNTER — Ambulatory Visit: Payer: Self-pay | Admitting: Dietician

## 2017-01-30 ENCOUNTER — Ambulatory Visit: Payer: Self-pay

## 2017-02-03 ENCOUNTER — Ambulatory Visit: Payer: Self-pay

## 2017-02-03 ENCOUNTER — Telehealth: Payer: Self-pay | Admitting: Internal Medicine

## 2017-02-03 NOTE — Telephone Encounter (Signed)
APT. REMINDER CALL, NO ANSWER, NO VOICEMAIL °

## 2017-02-05 ENCOUNTER — Ambulatory Visit: Payer: Self-pay

## 2017-02-10 ENCOUNTER — Ambulatory Visit (INDEPENDENT_AMBULATORY_CARE_PROVIDER_SITE_OTHER): Payer: Self-pay | Admitting: Internal Medicine

## 2017-02-10 ENCOUNTER — Encounter: Payer: Self-pay | Admitting: Internal Medicine

## 2017-02-10 VITALS — BP 128/68 | HR 82 | Temp 97.7°F | Ht 67.0 in | Wt 200.3 lb

## 2017-02-10 DIAGNOSIS — E119 Type 2 diabetes mellitus without complications: Secondary | ICD-10-CM

## 2017-02-10 DIAGNOSIS — Z87891 Personal history of nicotine dependence: Secondary | ICD-10-CM

## 2017-02-10 DIAGNOSIS — I10 Essential (primary) hypertension: Secondary | ICD-10-CM

## 2017-02-10 DIAGNOSIS — Z0000 Encounter for general adult medical examination without abnormal findings: Secondary | ICD-10-CM

## 2017-02-10 DIAGNOSIS — K219 Gastro-esophageal reflux disease without esophagitis: Secondary | ICD-10-CM

## 2017-02-10 LAB — POCT GLYCOSYLATED HEMOGLOBIN (HGB A1C): HEMOGLOBIN A1C: 6.5

## 2017-02-10 LAB — GLUCOSE, CAPILLARY: Glucose-Capillary: 123 mg/dL — ABNORMAL HIGH (ref 65–99)

## 2017-02-10 MED ORDER — TRIAMTERENE-HCTZ 37.5-25 MG PO TABS: 1.0000 | ORAL_TABLET | Freq: Every day | ORAL | 5 refills | Status: DC

## 2017-02-10 NOTE — Patient Instructions (Signed)
General Instructions: - Refill made for Maxzide for blood pressure - Can wait to schedule mammogram until your next visit - Work on increasing walking to at least 30 minutes daily - You are doing a great job with your diet- continue to cut down on chips, cookies - Pap smear clinic is on June 11th at this clinic from 6-7:30 PM You will need to register by calling [phone]  Thank you for bringing your medicines today. This helps [place] keep you safe from mistakes.   Progress Toward Treatment Goals:  Treatment Goal 07/17/2015  Blood pressure unchanged  Stop smoking -    Self Care Goals & Plans:  Self Care Goal 02/10/2017  Manage my medications take my medicines as prescribed; bring my medications to every visit; refill my medications on time  Monitor my health keep track of my blood pressure  Eat healthy foods eat more vegetables; eat foods that are low in salt; eat baked foods instead of fried foods  Be physically active find an activity I enjoy  Stop smoking -    No flowsheet data found.   Care Management & Community Referrals:  Referral 02/27/2015  Referrals made for care management support none needed

## 2017-02-10 NOTE — Progress Notes (Signed)
   CC: Prediabetes follow up  HPI:  Cassandra Park is a 56 y.o. woman with PMHx as noted below who presents today for follow up of prediabetes.  Prediabetes, now withType 2 DM: Last A1c 6.4, today her A1c is 6.5. She notes she met with Butch Penny for nutrition counseling and found this beneficial. She has been working diligently on cutting down on chips and cookies and has switched from regular soda to diet soda. She reports still having polyuria and polydipsia. She has been walking 15-20 minutes during her lunch break but would like to start walking after work as well.  HTN: BP initially elevated at 155/74. Repeat BP 128/68. She is taking Amlodipine 10 mg daily, Coreg 3.125 mg BID, and Maxzide 37.5-25 mg daily.   GERD: Reports her acid reflux has resolved since she restarted Protonix 40 mg daily. She has also been avoiding foods that flare up her reflux such as spaghetti.  Past Medical History:  Diagnosis Date  . Allergic rhinitis   . 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 2 pap smears since then normal. [facility]  . HTN (hypertension)   . Hyperlipidemia   . 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   . Osteoarthritis    Left hip  . Substance abuse    tobacco, alcohol  . Vaginal bleeding, abnormal June 2010   Thought to be 2/2 DUB vs endometrial CA. Gyn referral made at that time.    Review of Systems:   All negative except per HPI  Physical Exam:  Vitals:   02/10/17 1509  BP: (!) 155/74  Pulse: 96  Temp: 97.7 F (36.5 C)  TempSrc: Oral  SpO2: 100%  Weight: 200 lb 4.8 oz (90.9 kg)  Height: '5\' 7"'$  (1.702 m)   Repeat BP 128/68  General: Obese woman in NAD CV: RRR, 3/6 systolic murmur appreciated Pulm: CTA bilaterally, breaths non-labored  Assessment & Plan:   See Encounters Tab for problem based charting.  Patient discussed with Dr. Angelia Mould

## 2017-02-11 NOTE — Assessment & Plan Note (Signed)
Her A1c has increased to 6.5 despite making dietary efforts. Encouraged her to continue cutting down on carbohydrates and to start walking at least 30 minutes daily. I did not have her A1c result available until after she left the office so will discuss with her about starting low dose Metformin especially given her symptoms of polyuria and polydipsia. We discussed the importance of weight loss and how this could reverse her elevated blood sugars. I even wrote her a note for work so that she can use her Fitbit watch to keep track of her step count. She will need follow up in 3 months to ensure she has glycemic control.

## 2017-02-11 NOTE — Assessment & Plan Note (Signed)
Advised her to wait to schedule mammogram until next visit since not due until August. She was given information on the free pap smear clinic next month since she is uninsured. She has a colonoscopy scheduled for next month.

## 2017-02-11 NOTE — Assessment & Plan Note (Signed)
BP controlled. -Continue current regimen  

## 2017-02-11 NOTE — Assessment & Plan Note (Signed)
Symptoms controlled on Protonix once daily. We reviewed how to take this medication appropriately.

## 2017-02-13 ENCOUNTER — Other Ambulatory Visit: Payer: Self-pay | Admitting: Internal Medicine

## 2017-02-13 MED ORDER — METFORMIN HCL 500 MG PO TABS: 500.0000 mg | ORAL_TABLET | Freq: Two times a day (BID) | ORAL | 2 refills | Status: DC

## 2017-02-16 ENCOUNTER — Telehealth: Payer: Self-pay | Admitting: *Deleted

## 2017-02-16 NOTE — Telephone Encounter (Signed)
Pt called / informed "her repeat hemoglobin A1c was high at 6.5 which is consistent with diabetes. Tell her I would like for her to start Metformin 500 mg twice daily and have sent this prescription in." per Dr Arcelia Jew. Pt stated she will go ahead and start Metformin; informed rx was sent to [facility] (should be on 4$ list).

## 2017-02-16 NOTE — Progress Notes (Signed)
Internal Medicine Clinic Attending  Case discussed with Dr. Rivet soon after the resident saw the patient.  We reviewed the resident's history and exam and pertinent patient test results.  I agree with the assessment, diagnosis, and plan of care documented in the resident's note.  

## 2017-02-16 NOTE — Telephone Encounter (Signed)
-----   Message from Juliet Rude, MD sent at 02/13/2017  9:43 PM EDT ----- Hi again,  Can you let Ms. Jagoda know that her repeat hemoglobin A1c was high at 6.5 which is consistent with diabetes. Tell her I would like for her to start Metformin 500 mg twice daily and have sent this prescription in. Please let her know I am out of town this coming week (June 4-8) so if she would like to wait to start Metformin and talk to me when I get back into town I am fine with that too.   Thank you! Carly

## 2017-02-23 ENCOUNTER — Other Ambulatory Visit: Payer: Self-pay

## 2017-02-24 ENCOUNTER — Encounter: Payer: Self-pay | Admitting: *Deleted

## 2017-02-27 ENCOUNTER — Ambulatory Visit (AMBULATORY_SURGERY_CENTER): Payer: Self-pay

## 2017-02-27 VITALS — Ht 69.0 in | Wt 197.6 lb

## 2017-02-27 DIAGNOSIS — Z1211 Encounter for screening for malignant neoplasm of colon: Secondary | ICD-10-CM

## 2017-02-27 LAB — CYTOLOGY - PAP: Diagnosis: NEGATIVE

## 2017-02-27 MED ORDER — NA SULFATE-K SULFATE-MG SULF 17.5-3.13-1.6 GM/177ML PO SOLN: 1.0000 | Freq: Once | ORAL | 0 refills | Status: AC

## 2017-02-27 NOTE — Progress Notes (Signed)
Denies allergies to eggs or soy products. Denies complication of anesthesia or sedation. Denies use of weight loss medication. Denies use of O2.   Emmi instructions declined. Patient does not have email.

## 2017-03-06 ENCOUNTER — Encounter: Payer: Self-pay | Admitting: Internal Medicine

## 2017-03-06 ENCOUNTER — Ambulatory Visit (AMBULATORY_SURGERY_CENTER): Payer: Self-pay | Admitting: Internal Medicine

## 2017-03-06 VITALS — BP 116/73 | HR 82 | Temp 98.4°F | Resp 11 | Ht 69.0 in | Wt 197.0 lb

## 2017-03-06 DIAGNOSIS — D123 Benign neoplasm of transverse colon: Secondary | ICD-10-CM

## 2017-03-06 DIAGNOSIS — Z1212 Encounter for screening for malignant neoplasm of rectum: Secondary | ICD-10-CM

## 2017-03-06 DIAGNOSIS — Z1211 Encounter for screening for malignant neoplasm of colon: Secondary | ICD-10-CM

## 2017-03-06 DIAGNOSIS — K635 Polyp of colon: Secondary | ICD-10-CM

## 2017-03-06 DIAGNOSIS — D126 Benign neoplasm of colon, unspecified: Secondary | ICD-10-CM

## 2017-03-06 MED ORDER — SODIUM CHLORIDE 0.9 % IV SOLN: 500.0000 mL | INTRAVENOUS | Status: DC

## 2017-03-06 NOTE — Patient Instructions (Addendum)
   I found and removed one small polyp. I will let you know pathology results and when to have another routine colonoscopy by mail and/or My Chart.  I appreciate the opportunity to care for you. Gatha Mayer, MD, [facility]  Handout on polyps given to patient.  YOU HAD AN ENDOSCOPIC PROCEDURE TODAY AT [facility]:   Refer to the procedure report that was given to you for any specific questions about what was found during the examination.  If the procedure report does not answer your questions, please call your gastroenterologist to clarify.  If you requested that your care partner not be given the details of your procedure findings, then the procedure report has been included in a sealed envelope for you to review at your convenience later.  YOU SHOULD EXPECT: Some feelings of bloating in the abdomen. Passage of more gas than usual.  Walking can help get rid of the air that was put into your GI tract during the procedure and reduce the bloating. If you had a lower endoscopy (such as a colonoscopy or flexible sigmoidoscopy) you may notice spotting of blood in your stool or on the toilet paper. If you underwent a bowel prep for your procedure, you may not have a normal bowel movement for a few days.  Please Note:  You might notice some irritation and congestion in your nose or some drainage.  This is from the oxygen used during your procedure.  There is no need for concern and it should clear up in a day or so.  SYMPTOMS TO REPORT IMMEDIATELY:   Following lower endoscopy (colonoscopy or flexible sigmoidoscopy):  Excessive amounts of blood in the stool  Significant tenderness or worsening of abdominal pains  Swelling of the abdomen that is new, acute  Fever of 100F or higher For urgent or emergent issues, a gastroenterologist can be reached at any hour by calling [phone].   DIET:  We do recommend a small meal at first, but then you may proceed to your regular diet.   Drink plenty of fluids but you should avoid alcoholic beverages for 24 hours.  ACTIVITY:  You should plan to take it easy for the rest of today and you should NOT DRIVE or use heavy machinery until tomorrow (because of the sedation medicines used during the test).    FOLLOW UP: Our staff will call the number listed on your records the next business day following your procedure to check on you and address any questions or concerns that you may have regarding the information given to you following your procedure. If we do not reach you, we will leave a message.  However, if you are feeling well and you are not experiencing any problems, there is no need to return our call.  We will assume that you have returned to your regular daily activities without incident.  If any biopsies were taken you will be contacted by phone or by letter within the next 1-3 weeks.  Please call us at [phone] if you have not heard about the biopsies in 3 weeks.    SIGNATURES/CONFIDENTIALITY: You and/or your care partner have signed paperwork which will be entered into your electronic medical record.  These signatures attest to the fact that that the information above on your After Visit Summary has been reviewed and is understood.  Full responsibility of the confidentiality of this discharge information lies with you and/or your care-partner.

## 2017-03-06 NOTE — Progress Notes (Signed)
Pt's states no medical or surgical changes since previsit or office visit. 

## 2017-03-06 NOTE — Op Note (Signed)
[name] Procedure Date: 03/06/2017 8:52 AM MRN: [id number] Endoscopist: Gatha Mayer , MD Age: 56 Referring MD:  Date of Birth: Sep 07, 1961 Gender: Female Account #: [id number] Procedure:                Colonoscopy Indications:              Screening for colorectal malignant neoplasm, This                            is the patient's first colonoscopy Medicines:                Propofol per Anesthesia, Monitored Anesthesia Care Procedure:                Pre-Anesthesia Assessment:                           - Prior to the procedure, a History and Physical                            was performed, and patient medications and                            allergies were reviewed. The patient's tolerance of                            previous anesthesia was also reviewed. The risks                            and benefits of the procedure and the sedation                            options and risks were discussed with the patient.                            All questions were answered, and informed consent                            was obtained. Prior Anticoagulants: The patient has                            taken no previous anticoagulant or antiplatelet                            agents. ASA Grade Assessment: II - A patient with                            mild systemic disease. After reviewing the risks                            and benefits, the patient was deemed in                            satisfactory condition to undergo the procedure.  After obtaining informed consent, the colonoscope                            was passed under direct vision. Throughout the                            procedure, the patient's blood pressure, pulse, and                            oxygen saturations were monitored continuously. The                            Colonoscope was introduced through the anus and   advanced to the the cecum, identified by                            appendiceal orifice and ileocecal valve. The                            colonoscopy was performed without difficulty. The                            patient tolerated the procedure well. The quality                            of the bowel preparation was good. The ileocecal                            valve, appendiceal orifice, and rectum were                            photographed. Scope In: 8:58:03 AM Scope Out: 9:16:49 AM Scope Withdrawal Time: 0 hours 15 minutes 14 seconds  Total Procedure Duration: 0 hours 18 minutes 46 seconds  Findings:                 The perianal and digital rectal examinations were                            normal.                           A 7 mm polyp was found in the transverse colon. The                            polyp was sessile. The polyp was removed with a                            cold snare. Resection and retrieval were complete.                            Verification of patient identification for the                            specimen was done. Estimated blood loss was  minimal.                           The exam was otherwise without abnormality on                            direct and retroflexion views. Complications:            No immediate complications. Estimated Blood Loss:     Estimated blood loss was minimal. Impression:               - One 7 mm polyp in the transverse colon, removed                            with a cold snare. Resected and retrieved.                           - The examination was otherwise normal on direct                            and retroflexion views. Recommendation:           - Patient has a contact number available for                            emergencies. The signs and symptoms of potential                            delayed complications were discussed with the                            patient. Return to normal activities tomorrow.                             Written discharge instructions were provided to the                            patient.                           - Resume previous diet.                           - Continue present medications.                           - Repeat colonoscopy is recommended. The                            colonoscopy date will be determined after pathology                            results from today's exam become available for                            review. Gatha Mayer, MD 03/06/2017 9:27:27 AM This report has been signed electronically.

## 2017-03-06 NOTE — Progress Notes (Signed)
Called to room to assist during endoscopic procedure.  Patient ID and intended procedure confirmed with present staff. Received instructions for my participation in the procedure from the performing physician.  

## 2017-03-06 NOTE — Progress Notes (Signed)
To PACU VSS, Report to [email or web address] 

## 2017-03-09 ENCOUNTER — Telehealth: Payer: Self-pay | Admitting: *Deleted

## 2017-03-09 NOTE — Telephone Encounter (Signed)
  Follow up Call-  Call back number 03/06/2017  Post procedure Call Back phone  # [phone]  Permission to leave phone message Yes  Some recent data might be hidden     Patient questions:  Do you have a fever, pain , or abdominal swelling? Yes.   Pain Score  0 *  Have you tolerated food without any problems? Yes.    Have you been able to return to your normal activities? Yes.    Do you have any questions about your discharge instructions: Diet   No. Medications  No. Follow up visit  No.  Do you have questions or concerns about your Care? No.  Actions: * If pain score is 4 or above: No action needed, pain <4.

## 2017-03-12 ENCOUNTER — Encounter: Payer: Self-pay | Admitting: Internal Medicine

## 2017-03-12 DIAGNOSIS — Z860100 Personal history of colon polyps, unspecified: Secondary | ICD-10-CM

## 2017-03-12 HISTORY — DX: Personal history of colonic polyps: Z86.010

## 2017-03-12 HISTORY — DX: Personal history of colon polyps, unspecified: Z86.0100

## 2017-03-12 NOTE — Progress Notes (Signed)
7 mm transverse hyperplastic polyp Recall 2023

## 2017-03-23 ENCOUNTER — Other Ambulatory Visit: Payer: Self-pay | Admitting: Internal Medicine

## 2017-03-23 DIAGNOSIS — I10 Essential (primary) hypertension: Secondary | ICD-10-CM

## 2017-03-23 MED ORDER — AMLODIPINE BESYLATE 10 MG PO TABS: 10.0000 mg | ORAL_TABLET | Freq: Every day | ORAL | 1 refills | Status: DC

## 2017-03-23 NOTE — Telephone Encounter (Signed)
Refill Request at the [facility] Department  amLODipine (NORVASC) 10 MG tablet

## 2017-04-01 ENCOUNTER — Other Ambulatory Visit: Payer: Self-pay | Admitting: Internal Medicine

## 2017-04-01 ENCOUNTER — Telehealth: Payer: Self-pay | Admitting: *Deleted

## 2017-04-01 DIAGNOSIS — Z1231 Encounter for screening mammogram for malignant neoplasm of breast: Secondary | ICD-10-CM

## 2017-04-01 NOTE — Telephone Encounter (Signed)
Pt would like a referral to [facility] [address] for her mammogram, she will call and schedule an appt but she thinks since it has been 2 years she will need a referral. Could we please do this?

## 2017-04-07 ENCOUNTER — Other Ambulatory Visit: Payer: Self-pay | Admitting: Internal Medicine

## 2017-04-07 DIAGNOSIS — Z1231 Encounter for screening mammogram for malignant neoplasm of breast: Secondary | ICD-10-CM

## 2017-04-13 ENCOUNTER — Other Ambulatory Visit: Payer: Self-pay | Admitting: Obstetrics and Gynecology

## 2017-04-13 DIAGNOSIS — Z1231 Encounter for screening mammogram for malignant neoplasm of breast: Secondary | ICD-10-CM

## 2017-04-21 ENCOUNTER — Other Ambulatory Visit: Payer: Self-pay

## 2017-04-21 DIAGNOSIS — K219 Gastro-esophageal reflux disease without esophagitis: Secondary | ICD-10-CM

## 2017-04-21 MED ORDER — PANTOPRAZOLE SODIUM 40 MG PO TBEC: 40.0000 mg | DELAYED_RELEASE_TABLET | Freq: Two times a day (BID) | ORAL | 2 refills | Status: DC

## 2017-04-21 NOTE — Telephone Encounter (Signed)
pantoprazole (PROTONIX) 40 MG tablet,  refill request @ [facility]

## 2017-04-28 ENCOUNTER — Encounter (HOSPITAL_COMMUNITY): Payer: Self-pay

## 2017-04-28 ENCOUNTER — Ambulatory Visit (HOSPITAL_COMMUNITY)
Admission: RE | Admit: 2017-04-28 | Discharge: 2017-04-28 | Disposition: A | Discharge status: Home / Self Care | Payer: Self-pay | Source: Ambulatory Visit | Attending: Obstetrics and Gynecology | Admitting: Obstetrics and Gynecology

## 2017-04-28 ENCOUNTER — Ambulatory Visit
Admission: RE | Admit: 2017-04-28 | Discharge: 2017-04-28 | Disposition: A | Discharge status: Home / Self Care | Payer: No Typology Code available for payment source | Source: Ambulatory Visit | Attending: Obstetrics and Gynecology | Admitting: Obstetrics and Gynecology

## 2017-04-28 VITALS — BP 145/86 | HR 85 | Temp 97.8°F | Ht 69.0 in | Wt 194.4 lb

## 2017-04-28 DIAGNOSIS — Z1231 Encounter for screening mammogram for malignant neoplasm of breast: Secondary | ICD-10-CM

## 2017-04-28 DIAGNOSIS — Z1239 Encounter for other screening for malignant neoplasm of breast: Secondary | ICD-10-CM

## 2017-04-28 NOTE — Progress Notes (Signed)
No complaints today.   Pap Smear: Pap smear not completed today. Last Pap smear was 02/23/2017 at the free cervical cancer screening at [facility] and normal. Per patient has a history of an abnormal Pap smear when she was around 56 years old that cryotherapy was completed for follow up. Per patient all Pap smears have been normal since cryotherapy. Last Pap smear result is in EPIC.  Physical exam: Breasts Breasts symmetrical. No skin abnormalities bilateral breasts. No nipple retraction bilateral breasts. No nipple discharge bilateral breasts. No lymphadenopathy. No lumps palpated bilateral breasts. No complaints of pain or tenderness on exam. Referred patient to the [place] for a screening mammogram. Appointment scheduled for Tuesday, April 28, 2017 at 1410.        Pelvic/Bimanual No Pap smear completed today since last Pap smear was 02/23/2017. Pap smear not indicated per BCCCP guidelines.   Smoking History: Patient is a former smoker. Patient quit smoking 03/29/2013.  Patient Navigation: Patient education provided. Access to services provided for patient through [place] program.   Colorectal Cancer Screening: Patient had a colonoscopy completed 03/06/2017. No complaints today.

## 2017-04-28 NOTE — Patient Instructions (Signed)
Explained breast self awareness with BREUNNA NORDMANN. Patient did not need a Pap smear today due to last Pap smear was 02/23/2017. Let her know BCCCP will cover Pap smears every 3 years unless has a history of abnormal Pap smears. Referred patient to the [place] for a screening mammogram. Appointment scheduled for Tuesday, April 28, 2017 at 1410. Let patient know the Breast Center will follow up with her within the next couple weeks with results of mammogram by letter or phone. Elin CHERESA SIERS verbalized understanding.  Hebe Merriwether, [name], RN 1:29 PM

## 2017-04-30 ENCOUNTER — Other Ambulatory Visit: Payer: Self-pay

## 2017-04-30 NOTE — Telephone Encounter (Signed)
Needs to speak with a nurse about med. Please call pt back.  

## 2017-05-01 NOTE — Telephone Encounter (Signed)
Stated she talked to [facility] yesterday and she feels "a lot better" today.

## 2017-05-14 ENCOUNTER — Ambulatory Visit (HOSPITAL_COMMUNITY): Payer: Self-pay

## 2017-05-19 ENCOUNTER — Other Ambulatory Visit: Payer: Self-pay | Admitting: Internal Medicine

## 2017-05-19 MED ORDER — METFORMIN HCL 500 MG PO TABS
500.0000 mg | ORAL_TABLET | Freq: Two times a day (BID) | ORAL | 6 refills | Status: DC
Start: 2017-05-19 — End: 2017-12-02

## 2017-05-20 ENCOUNTER — Other Ambulatory Visit: Payer: Self-pay

## 2017-05-20 NOTE — Telephone Encounter (Signed)
Confirmed with pharmacy-pt already has refills avail. Call made to patient to make her aware, no answer, HIPPA compliant message left on recorder.Despina Hidden Cassady9/5/20181:45 PM

## 2017-05-20 NOTE — Telephone Encounter (Signed)
Pt called back to say she uses [facility] for [facility] particular rx- I called to have rx transferred.  Phone call complete-no further action needed.Marland KitchenMarland KitchenDespina Hidden Cassady9/5/20182:14 PM

## 2017-05-20 NOTE — Telephone Encounter (Signed)
carvedilol (COREG) 3.125 MG tablet, refill request.

## 2017-05-25 NOTE — Progress Notes (Signed)
   CC: Follow-up of hypertension, diabetes  HPI:  [name] is a 56 y.o. F with past medical history as described below who presents with clinic for follow-up of chronic medical conditions.  HTN: She feels she is doing well with her blood pressure. Denies any symptoms of lightheadedness, dizziness. She denies any perceived side effects to her regimen and is taking all medications as prescribed.  DM: She was recently diagnosed diabetes type 2 from prediabetes. She was started on metformin 500 twice a day and has been tolerating well with no GI side effects. She has continued to try to walk daily and eat more healthy food options including spinach and kale. She has previously met with Butch Penny which she found helpful.  GERD: She reports her symptoms have resolved, she is still currently taking Protonix 40 mg. Would like to stop this medicine if possible.  Past Medical History:  Diagnosis Date  . Allergic rhinitis   . Allergy   . Diabetes mellitus without complication ([place])   . GERD (gastroesophageal reflux disease)   . Heart murmur   . 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 2 pap smears since then normal. [facility]  . HTN (hypertension)   . Hx of colonic polyp 03/12/2017   7 mm hyperplastic transverse recall 2023  . Hyperlipidemia   . 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   . Osteoarthritis    Left hip  . Substance abuse    tobacco, alcohol  . Vaginal bleeding, abnormal June 2010   Thought to be 2/2 DUB vs endometrial CA. Gyn referral made at that time.   Review of Systems:  Review of Systems  Constitutional: Negative for fever and weight loss.  Gastrointestinal: Negative for abdominal pain.  Neurological: Negative for dizziness.  Psychiatric/Behavioral: Negative for depression.     Physical Exam:  Vitals:   05/26/17 1545  BP: 133/64  Pulse: 92  Temp: 98.3 F (36.8 C)  TempSrc: Oral  SpO2:  99%  Weight: 198 lb 1.6 oz (89.9 kg)  Height: '5\' 7"'$  (1.702 m)   Physical Exam  Constitutional: She is oriented to person, place, and time. She appears well-developed and well-nourished.  HENT:  Head: Normocephalic and atraumatic.  Cardiovascular: Normal rate, regular rhythm and intact distal pulses.   Pulmonary/Chest: Effort normal and breath sounds normal. No respiratory distress.  Neurological: She is alert and oriented to person, place, and time.  Skin: Capillary refill takes less than 2 seconds.    Assessment & Plan:   See Encounters Tab for problem based charting.  Patient seen with Dr. Dareen Piano

## 2017-05-26 ENCOUNTER — Ambulatory Visit (INDEPENDENT_AMBULATORY_CARE_PROVIDER_SITE_OTHER): Payer: Self-pay | Admitting: Internal Medicine

## 2017-05-26 ENCOUNTER — Encounter: Payer: Self-pay | Admitting: Internal Medicine

## 2017-05-26 VITALS — BP 133/64 | HR 92 | Temp 98.3°F | Ht 67.0 in | Wt 198.1 lb

## 2017-05-26 DIAGNOSIS — E785 Hyperlipidemia, unspecified: Secondary | ICD-10-CM

## 2017-05-26 DIAGNOSIS — I10 Essential (primary) hypertension: Secondary | ICD-10-CM

## 2017-05-26 DIAGNOSIS — K219 Gastro-esophageal reflux disease without esophagitis: Secondary | ICD-10-CM

## 2017-05-26 DIAGNOSIS — Z87891 Personal history of nicotine dependence: Secondary | ICD-10-CM

## 2017-05-26 DIAGNOSIS — E119 Type 2 diabetes mellitus without complications: Secondary | ICD-10-CM

## 2017-05-26 DIAGNOSIS — Z79899 Other long term (current) drug therapy: Secondary | ICD-10-CM

## 2017-05-26 LAB — GLUCOSE, CAPILLARY: GLUCOSE-CAPILLARY: 113 mg/dL — AB (ref 65–99)

## 2017-05-26 LAB — POCT GLYCOSYLATED HEMOGLOBIN (HGB A1C): Hemoglobin A1C: 6

## 2017-05-26 MED ORDER — LOVASTATIN 20 MG PO TABS: 20.0000 mg | ORAL_TABLET | Freq: Every day | ORAL | 5 refills | Status: DC

## 2017-05-26 NOTE — Patient Instructions (Addendum)
He was nice to meet you today [name].  Great job with her blood pressure, it looks great today! We will keep a log her blood pressure medicines the same.  Also, keep up the great work with your diabetes. Your A1c is right where it needs to be, and just keep with the regular walking and healthy diet choices. Your eye doctor can do a retinal exam which we did in people with diabetes. He can continue taking your metformin without changes.  He can stop taking the Protonix and see how you do. If your reflux symptoms return, let us know and we can restart the medicine.  We will see you back in 3 months for a checkup and some lab work.

## 2017-05-26 NOTE — Assessment & Plan Note (Signed)
She is currently taking Protonix once daily and has had resolution of her symptoms. She would like to trial off of this medication. She will finish the remaining pills of her current bottle and assess her symptoms off of the medication. Instructed to contact if she has recurrence of reflux symptoms.

## 2017-05-26 NOTE — Assessment & Plan Note (Signed)
Patient currently on lovastatin 20 mg, with new diabetes diagnosis may benefit from increase in statin intensity. Will recheck a lipid panel on the next visit for risk calculation. For now, refill provided for lovastatin 20 mg.

## 2017-05-26 NOTE — Assessment & Plan Note (Signed)
Blood pressure today 133/64, well controlled on current regimen. Continue without changes

## 2017-05-26 NOTE — Assessment & Plan Note (Signed)
Recently progressed from prediabetes 2 diabetes. She has continued to make dietary adjustments and try to walk daily during her lunch break. She was also started on metformin 500 mg twice a day. Her A1c today has decreased to 6.0. Foot exam performed today. Will check a urine microalbumin as a baseline and to guide any necessary changes to her hypertension regimen, though she is currently well controlled.

## 2017-05-27 LAB — MICROALBUMIN / CREATININE URINE RATIO
Creatinine, Urine: 69.1 mg/dL
MICROALB/CREAT RATIO: 25.3 mg/g{creat} (ref 0.0–30.0)
Microalbumin, Urine: 17.5 ug/mL

## 2017-05-28 NOTE — Progress Notes (Signed)
Internal Medicine Clinic Attending  I saw and evaluated the patient.  I personally confirmed the key portions of the history and exam documented by Dr. Harden and I reviewed pertinent patient test results.  The assessment, diagnosis, and plan were formulated together and I agree with the documentation in the resident's note.  

## 2017-07-30 ENCOUNTER — Ambulatory Visit: Payer: No Typology Code available for payment source

## 2017-09-02 ENCOUNTER — Other Ambulatory Visit: Payer: Self-pay

## 2017-09-02 DIAGNOSIS — I10 Essential (primary) hypertension: Secondary | ICD-10-CM

## 2017-09-02 MED ORDER — TRIAMTERENE-HCTZ 37.5-25 MG PO TABS: 1.0000 | ORAL_TABLET | Freq: Every day | ORAL | 3 refills | Status: DC

## 2017-09-02 NOTE — Telephone Encounter (Signed)
triamterene-hydrochlorothiazide (MAXZIDE-25) 37.5-25 MG tablet, refill request. Per patient the pharmacy is still waiting for the clinic to reply back.

## 2017-09-02 NOTE — Telephone Encounter (Signed)
Pt has an appt 12/27 in [facility].

## 2017-09-03 ENCOUNTER — Other Ambulatory Visit: Payer: Self-pay | Admitting: Internal Medicine

## 2017-09-03 NOTE — Telephone Encounter (Signed)
Pt called / informed rx was refilled yesterday. Asked if med was changed b/c she was told it will cost $10.00 instead of usual $4.00. Informed rx is for 90 days supply - which will be $2.00 cheaper. Stated ok.

## 2017-09-03 NOTE — Telephone Encounter (Signed)
NEEDS REFILL TRIAMP-HCTZ 37.5 25MG . Cassandra Park [phone]

## 2017-09-10 ENCOUNTER — Other Ambulatory Visit: Payer: Self-pay

## 2017-09-10 ENCOUNTER — Ambulatory Visit (INDEPENDENT_AMBULATORY_CARE_PROVIDER_SITE_OTHER): Payer: Self-pay | Admitting: Internal Medicine

## 2017-09-10 ENCOUNTER — Encounter: Payer: Self-pay | Admitting: Internal Medicine

## 2017-09-10 VITALS — BP 149/78 | HR 84 | Temp 97.7°F | Ht 67.0 in | Wt 192.3 lb

## 2017-09-10 DIAGNOSIS — E119 Type 2 diabetes mellitus without complications: Secondary | ICD-10-CM

## 2017-09-10 DIAGNOSIS — E785 Hyperlipidemia, unspecified: Secondary | ICD-10-CM

## 2017-09-10 DIAGNOSIS — Z79899 Other long term (current) drug therapy: Secondary | ICD-10-CM

## 2017-09-10 DIAGNOSIS — K219 Gastro-esophageal reflux disease without esophagitis: Secondary | ICD-10-CM

## 2017-09-10 DIAGNOSIS — I10 Essential (primary) hypertension: Secondary | ICD-10-CM

## 2017-09-10 DIAGNOSIS — M25512 Pain in left shoulder: Secondary | ICD-10-CM

## 2017-09-10 DIAGNOSIS — R011 Cardiac murmur, unspecified: Secondary | ICD-10-CM

## 2017-09-10 DIAGNOSIS — Z87891 Personal history of nicotine dependence: Secondary | ICD-10-CM

## 2017-09-10 DIAGNOSIS — M170 Bilateral primary osteoarthritis of knee: Secondary | ICD-10-CM

## 2017-09-10 MED ORDER — AMLODIPINE BESYLATE 10 MG PO TABS: 10.0000 mg | ORAL_TABLET | Freq: Every day | ORAL | 1 refills | Status: DC

## 2017-09-10 MED ORDER — PANTOPRAZOLE SODIUM 40 MG PO TBEC: 40.0000 mg | DELAYED_RELEASE_TABLET | Freq: Every day | ORAL | 1 refills | Status: DC

## 2017-09-10 NOTE — Assessment & Plan Note (Signed)
She has been taking Triamterene-HCTZ 37.5-25 mg daily and Amlodipine 10 mg daily. She is requesting a refill of her Amlodipine 10 mg daily. BP Readings from Last 3 Encounters:  09/10/17 (!) 149/78  05/26/17 133/64  04/28/17 (!) 145/86   A/P: Her BP is slightly elevated. Will refill her Amlodipine 10 mg daily and continue Triamterene-HCTZ 37.5-25 mg daily for now. Can increase the latter dosing if needed on follow up.

## 2017-09-10 NOTE — Patient Instructions (Addendum)
It was a pleasure to meet you Cassandra Park.  I am glad your left shoulder pain is improving. This was likely from the fall you had last month. I do not think you have broken any bones or torn any muscles or tendons.  You can try Ibuprofen 600 mg every 8 hours as needed for pain. Take this with food or a glass of water.   I have refilled your Pantoprazole (Protonix) 40 mg once daily and your Amlodipine 10 mg once daily.  Please follow up with Dr. Johny Chess in about 1-2 months.

## 2017-09-10 NOTE — Assessment & Plan Note (Addendum)
She is taking Protonix 40 mg once daily with benefit. She says her heartburn/reflux is controlled well while on this and does have recurrence of symptoms when off. She is requesting a refill. A/P: GERD is well controlled with Protonix 40 mg once daily which I have refilled. She is also advised that if she uses NSAIDs to take with a meal and/or full glass of water.
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
## 2017-09-10 NOTE — Progress Notes (Signed)
Internal Medicine Clinic Attending  Case discussed with Dr. Patel  at the time of the visit.  We reviewed the resident's history and exam and pertinent patient test results.  I agree with the assessment, diagnosis, and plan of care documented in the resident's note.  Alexander N Raines, MD   
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
## 2017-09-16 ENCOUNTER — Telehealth: Payer: Self-pay

## 2017-09-16 NOTE — Telephone Encounter (Signed)
Needs to speak with a nurse about meds. Please call pt back.  

## 2017-09-17 NOTE — Telephone Encounter (Signed)
Called pt, she was concerned about recall on med, reassured her and ask her to call pharmacy, speak w/ pharmacist and confirm, she was agreeable

## 2017-10-12 ENCOUNTER — Ambulatory Visit (INDEPENDENT_AMBULATORY_CARE_PROVIDER_SITE_OTHER): Payer: Self-pay | Admitting: Internal Medicine

## 2017-10-12 ENCOUNTER — Encounter: Payer: Self-pay | Admitting: Internal Medicine

## 2017-10-12 ENCOUNTER — Other Ambulatory Visit: Payer: Self-pay

## 2017-10-12 VITALS — BP 126/70 | HR 80 | Temp 97.9°F | Ht 69.0 in | Wt 188.6 lb

## 2017-10-12 DIAGNOSIS — K219 Gastro-esophageal reflux disease without esophagitis: Secondary | ICD-10-CM

## 2017-10-12 DIAGNOSIS — Z79899 Other long term (current) drug therapy: Secondary | ICD-10-CM

## 2017-10-12 DIAGNOSIS — Z7984 Long term (current) use of oral hypoglycemic drugs: Secondary | ICD-10-CM

## 2017-10-12 DIAGNOSIS — E119 Type 2 diabetes mellitus without complications: Secondary | ICD-10-CM

## 2017-10-12 DIAGNOSIS — E785 Hyperlipidemia, unspecified: Secondary | ICD-10-CM

## 2017-10-12 DIAGNOSIS — M25519 Pain in unspecified shoulder: Secondary | ICD-10-CM

## 2017-10-12 DIAGNOSIS — I10 Essential (primary) hypertension: Secondary | ICD-10-CM

## 2017-10-12 DIAGNOSIS — Z0000 Encounter for general adult medical examination without abnormal findings: Secondary | ICD-10-CM

## 2017-10-12 LAB — POCT GLYCOSYLATED HEMOGLOBIN (HGB A1C): Hemoglobin A1C: 5.8

## 2017-10-12 LAB — GLUCOSE, CAPILLARY: Glucose-Capillary: 107 mg/dL — ABNORMAL HIGH (ref 65–99)

## 2017-10-12 NOTE — Assessment & Plan Note (Signed)
Declined pneumonia vaccine today

## 2017-10-12 NOTE — Assessment & Plan Note (Addendum)
BP today improved to 126/70, currently on regimen of amlodipine 10 mg, triamterene-hydrochlorothiazide 37.5-25 mg, Coreg 3.125 mg BID.  Given her comorbid diabetes, she may benefit from transition to an ARB in the future, no current microalbuminuria, and would be fairly straightforward to exchange her Maxide with an ARB-HCTZ combo. She does have a documented allergy to lisinopril with angioedema as a reaction.  Will need to have a risk/benefit discussion on an ARB in the future.  We will continue current regimen without changes today.

## 2017-10-12 NOTE — Assessment & Plan Note (Addendum)
At last routine visit, a trial off of pantoprazole was attempted, however she states that her symptoms recurred while off the medicine at an acute care visit in the interval.  She now reports she intermittently has minor symptoms and will take the pantoprazole for small periods of time.  An H2 blocker may provide more benefit and be more appropriate for as needed dosing, and will recommend this rather than providing pantoprazole refill this is needed.

## 2017-10-12 NOTE — Patient Instructions (Addendum)
Nice to see you today Ms. Fassler.  Keep up the great work with your blood pressure and diabetes.  You have done a great job in all your numbers are looking very good.  We can probably space out checking your A1c to every 6 months because you are doing so well.  No hesitate to call the clinic if any issues arise between now and when I will see you back in about 6 months.  We can also refill any medicines if needed.

## 2017-10-12 NOTE — Assessment & Plan Note (Signed)
Patient has had success with current metformin regimen and implementing lifestyle changes including daily walking and increase vegetable intake.  She has lost 4 pounds since her last visit.  Her A1c today was 5.8.  We will space out follow-up to 6 months given her consistent control and continue Metformin at current 500 mg twice daily dosing.

## 2017-10-12 NOTE — Assessment & Plan Note (Signed)
Last lipid panel performed in 2016, will collect today for updated values and continue lovastatin 20 mg for now.
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
## 2017-10-12 NOTE — Progress Notes (Signed)
Internal Medicine Clinic Attending  Case discussed with Dr. Harden at the time of the visit.  We reviewed the resident's history and exam and pertinent patient test results.  I agree with the assessment, diagnosis, and plan of care documented in the resident's note.  

## 2017-10-13 LAB — LIPID PANEL
CHOLESTEROL TOTAL: 172 mg/dL (ref 100–199)
Chol/HDL Ratio: 2.3 ratio (ref 0.0–4.4)
HDL: 75 mg/dL (ref >39)
LDL CALC: 80 mg/dL (ref 0–99)
TRIGLYCERIDES: 84 mg/dL (ref 0–149)
VLDL Cholesterol Cal: 17 mg/dL (ref 5–40)

## 2017-12-02 ENCOUNTER — Encounter (INDEPENDENT_AMBULATORY_CARE_PROVIDER_SITE_OTHER): Payer: Self-pay

## 2017-12-02 ENCOUNTER — Ambulatory Visit (INDEPENDENT_AMBULATORY_CARE_PROVIDER_SITE_OTHER): Payer: Self-pay | Admitting: Internal Medicine

## 2017-12-02 ENCOUNTER — Encounter: Payer: Self-pay | Admitting: Internal Medicine

## 2017-12-02 VITALS — BP 120/63 | HR 93 | Temp 97.8°F | Wt 192.8 lb

## 2017-12-02 DIAGNOSIS — R0789 Other chest pain: Secondary | ICD-10-CM

## 2017-12-02 DIAGNOSIS — E119 Type 2 diabetes mellitus without complications: Secondary | ICD-10-CM

## 2017-12-02 DIAGNOSIS — Z7984 Long term (current) use of oral hypoglycemic drugs: Secondary | ICD-10-CM

## 2017-12-02 DIAGNOSIS — G8911 Acute pain due to trauma: Secondary | ICD-10-CM

## 2017-12-02 DIAGNOSIS — Z79899 Other long term (current) drug therapy: Secondary | ICD-10-CM

## 2017-12-02 DIAGNOSIS — I10 Essential (primary) hypertension: Secondary | ICD-10-CM

## 2017-12-02 DIAGNOSIS — R252 Cramp and spasm: Secondary | ICD-10-CM | POA: Insufficient documentation

## 2017-12-02 DIAGNOSIS — E785 Hyperlipidemia, unspecified: Secondary | ICD-10-CM

## 2017-12-02 DIAGNOSIS — G4762 Sleep related leg cramps: Secondary | ICD-10-CM

## 2017-12-02 DIAGNOSIS — K219 Gastro-esophageal reflux disease without esophagitis: Secondary | ICD-10-CM

## 2017-12-02 DIAGNOSIS — R29898 Other symptoms and signs involving the musculoskeletal system: Secondary | ICD-10-CM | POA: Insufficient documentation

## 2017-12-02 MED ORDER — METFORMIN HCL 500 MG PO TABS: 500.0000 mg | ORAL_TABLET | Freq: Two times a day (BID) | ORAL | 6 refills | Status: DC

## 2017-12-02 MED ORDER — B COMPLEX VITAMINS PO CAPS: 1.0000 | ORAL_CAPSULE | Freq: Every day | ORAL | 2 refills | Status: AC

## 2017-12-02 MED ORDER — LOVASTATIN 20 MG PO TABS: 20.0000 mg | ORAL_TABLET | Freq: Every day | ORAL | 5 refills | Status: DC

## 2017-12-02 MED ORDER — PANTOPRAZOLE SODIUM 40 MG PO TBEC: 40.0000 mg | DELAYED_RELEASE_TABLET | Freq: Every day | ORAL | 1 refills | Status: DC

## 2017-12-02 MED ORDER — CARVEDILOL 3.125 MG PO TABS: 3.1250 mg | ORAL_TABLET | Freq: Two times a day (BID) | ORAL | 11 refills | Status: DC

## 2017-12-02 NOTE — Patient Instructions (Signed)
Thank you for visiting clinic today. As we discussed you need to renew your letter to Physical therapy, please see Bonna Gains as soon as possible.  While you can do some of these exercises to build up the strength in your left shoulder. We are checking labs for your potassium level-we will call you with any abnormal results. If your leg cramps continue to get worse and potassium is fine, please visit [place] again and we can start you on some medication. Please follow-up with your primary care physician, please make an appointment to be seen in June 2019.       Shoulder Exercises Ask your health care provider which exercises are safe for you. Do exercises exactly as told by your health care provider and adjust them as directed. It is normal to feel mild stretching, pulling, tightness, or discomfort as you do these exercises, but you should stop right away if you feel sudden pain or your pain gets worse.Do not begin these exercises until told by your health care provider. RANGE OF MOTION EXERCISES These exercises warm up your muscles and joints and improve the movement and flexibility of your shoulder. These exercises also help to relieve pain, numbness, and tingling. These exercises involve stretching your injured shoulder directly. Exercise A: Pendulum  1. Stand near a wall or a surface that you can hold onto for balance. 2. Bend at the waist and let your left / right arm hang straight down. Use your other arm to support you. Keep your back straight and do not lock your knees. 3. Relax your left / right arm and shoulder muscles, and move your hips and your trunk so your left / right arm swings freely. Your arm should swing because of the motion of your body, not because you are using your arm or shoulder muscles. 4. Keep moving your body so your arm swings in the following directions, as told by your health care provider: ? Side to side. ? Forward and backward. ? In clockwise and  counterclockwise circles. 5. Continue each motion for __________ seconds, or for as long as told by your health care provider. 6. Slowly return to the starting position. Repeat __________ times. Complete this exercise __________ times a day. Exercise B:Flexion, Standing  1. Stand and hold a broomstick, a cane, or a similar object. Place your hands a little more than shoulder-width apart on the object. Your left / right hand should be palm-up, and your other hand should be palm-down. 2. Keep your elbow straight and keep your shoulder muscles relaxed. Push the stick down with your healthy arm to raise your left / right arm in front of your body, and then over your head until you feel a stretch in your shoulder. ? Avoid shrugging your shoulder while you raise your arm. Keep your shoulder blade tucked down toward the middle of your back. 3. Hold for __________ seconds. 4. Slowly return to the starting position. Repeat __________ times. Complete this exercise __________ times a day. Exercise C: Abduction, Standing 1. Stand and hold a broomstick, a cane, or a similar object. Place your hands a little more than shoulder-width apart on the object. Your left / right hand should be palm-up, and your other hand should be palm-down. 2. While keeping your elbow straight and your shoulder muscles relaxed, push the stick across your body toward your left / right side. Raise your left / right arm to the side of your body and then over your head until you feel  a stretch in your shoulder. ? Do not raise your arm above shoulder height, unless your health care provider tells you to do that. ? Avoid shrugging your shoulder while you raise your arm. Keep your shoulder blade tucked down toward the middle of your back. 3. Hold for __________ seconds. 4. Slowly return to the starting position. Repeat __________ times. Complete this exercise __________ times a day. Exercise D:Internal Rotation  1. Place your left /  right hand behind your back, palm-up. 2. Use your other hand to dangle an exercise band, a towel, or a similar object over your shoulder. Grasp the band with your left / right hand so you are holding onto both ends. 3. Gently pull up on the band until you feel a stretch in the front of your left / right shoulder. ? Avoid shrugging your shoulder while you raise your arm. Keep your shoulder blade tucked down toward the middle of your back. 4. Hold for __________ seconds. 5. Release the stretch by letting go of the band and lowering your hands. Repeat __________ times. Complete this exercise __________ times a day. STRETCHING EXERCISES These exercises warm up your muscles and joints and improve the movement and flexibility of your shoulder. These exercises also help to relieve pain, numbness, and tingling. These exercises are done using your healthy shoulder to help stretch the muscles of your injured shoulder. Exercise E: Warehouse manager (External Rotation and Abduction)  1. Stand in a doorway with one of your feet slightly in front of the other. This is called a staggered stance. If you cannot reach your forearms to the door frame, stand facing a corner of a room. 2. Choose one of the following positions as told by your health care provider: ? Place your hands and forearms on the door frame above your head. ? Place your hands and forearms on the door frame at the height of your head. ? Place your hands on the door frame at the height of your elbows. 3. Slowly move your weight onto your front foot until you feel a stretch across your chest and in the front of your shoulders. Keep your head and chest upright and keep your abdominal muscles tight. 4. Hold for __________ seconds. 5. To release the stretch, shift your weight to your back foot. Repeat __________ times. Complete this stretch __________ times a day. Exercise F:Extension, Standing 1. Stand and hold a broomstick, a cane, or a similar  object behind your back. ? Your hands should be a little wider than shoulder-width apart. ? Your palms should face away from your back. 2. Keeping your elbows straight and keeping your shoulder muscles relaxed, move the stick away from your body until you feel a stretch in your shoulder. ? Avoid shrugging your shoulders while you move the stick. Keep your shoulder blade tucked down toward the middle of your back. 3. Hold for __________ seconds. 4. Slowly return to the starting position. Repeat __________ times. Complete this exercise __________ times a day. STRENGTHENING EXERCISES These exercises build strength and endurance in your shoulder. Endurance is the ability to use your muscles for a long time, even after they get tired. Exercise G:External Rotation  1. Sit in a stable chair without armrests. 2. Secure an exercise band at elbow height on your left / right side. 3. Place a soft object, such as a folded towel or a small pillow, between your left / right upper arm and your body to move your elbow a few inches away (  about 10 cm) from your side. 4. Hold the end of the band so it is tight and there is no slack. 5. Keeping your elbow pressed against the soft object, move your left / right forearm out, away from your abdomen. Keep your body steady so only your forearm moves. 6. Hold for __________ seconds. 7. Slowly return to the starting position. Repeat __________ times. Complete this exercise __________ times a day. Exercise H:Shoulder Abduction  1. Sit in a stable chair without armrests, or stand. 2. Hold a __________ weight in your left / right hand, or hold an exercise band with both hands. 3. Start with your arms straight down and your left / right palm facing in, toward your body. 4. Slowly lift your left / right hand out to your side. Do not lift your hand above shoulder height unless your health care provider tells you that this is safe. ? Keep your arms straight. ? Avoid  shrugging your shoulder while you do this movement. Keep your shoulder blade tucked down toward the middle of your back. 5. Hold for __________ seconds. 6. Slowly lower your arm, and return to the starting position. Repeat __________ times. Complete this exercise __________ times a day. Exercise I:Shoulder Extension 1. Sit in a stable chair without armrests, or stand. 2. Secure an exercise band to a stable object in front of you where it is at shoulder height. 3. Hold one end of the exercise band in each hand. Your palms should face each other. 4. Straighten your elbows and lift your hands up to shoulder height. 5. Step back, away from the secured end of the exercise band, until the band is tight and there is no slack. 6. Squeeze your shoulder blades together as you pull your hands down to the sides of your thighs. Stop when your hands are straight down by your sides. Do not let your hands go behind your body. 7. Hold for __________ seconds. 8. Slowly return to the starting position. Repeat __________ times. Complete this exercise __________ times a day. Exercise J:Standing Shoulder Row 1. Sit in a stable chair without armrests, or stand. 2. Secure an exercise band to a stable object in front of you so it is at waist height. 3. Hold one end of the exercise band in each hand. Your palms should be in a thumbs-up position. 4. Bend each of your elbows to an "L" shape (about 90 degrees) and keep your upper arms at your sides. 5. Step back until the band is tight and there is no slack. 6. Slowly pull your elbows back behind you. 7. Hold for __________ seconds. 8. Slowly return to the starting position. Repeat __________ times. Complete this exercise __________ times a day. Exercise K:Shoulder Press-Ups  1. Sit in a stable chair that has armrests. Sit upright, with your feet flat on the floor. 2. Put your hands on the armrests so your elbows are bent and your fingers are pointing forward. Your  hands should be about even with the sides of your body. 3. Push down on the armrests and use your arms to lift yourself off of the chair. Straighten your elbows and lift yourself up as much as you comfortably can. ? Move your shoulder blades down, and avoid letting your shoulders move up toward your ears. ? Keep your feet on the ground. As you get stronger, your feet should support less of your body weight as you lift yourself up. 4. Hold for __________ seconds. 5. Slowly lower yourself  back into the chair. Repeat __________ times. Complete this exercise __________ times a day. Exercise L: Wall Push-Ups  1. Stand so you are facing a stable wall. Your feet should be about one arm-length away from the wall. 2. Lean forward and place your palms on the wall at shoulder height. 3. Keep your feet flat on the floor as you bend your elbows and lean forward toward the wall. 4. Hold for __________ seconds. 5. Straighten your elbows to push yourself back to the starting position. Repeat __________ times. Complete this exercise __________ times a day. This information is not intended to replace advice given to you by your health care provider. Make sure you discuss any questions you have with your health care provider. Document Released: 07/16/2005 Document Revised: 05/26/2016 Document Reviewed: 05/13/2015 Elsevier Interactive Patient Education  2018 [employer].

## 2017-12-02 NOTE — Progress Notes (Signed)
   CC: Leg cramps and inability to reach overhead with left shoulder.  HPI:  Cassandra Park is a 57 y.o. with past medical history as listed below came to the clinic with complaint of leg cramps, occurred twice in the past week while resting at night.  Cramp lasted for a couple of minute and she has to stand up to relieve the pain.  She has similar symptoms in the past few years ago and according to patient her potassium was low at that time so she wants to have her potassium rechecked.  She was also complaining of inability to reach overhead with left, she had a left shoulder injury last year around Thanksgiving, initially she was having some pain which subsides.  Now she is feeling that her left shoulder is little weak and she cannot reached over had with left hand.  She denies any pain, or swelling. she is able to perform all her ADLs.  She denies any other focal weakness.  She was also asking for refill of her medications.  Past Medical History:  Diagnosis Date  . Allergic rhinitis   . Allergy   . Diabetes mellitus without complication ([place])   . GERD (gastroesophageal reflux disease)   . Heart murmur   . 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 2 pap smears since then normal. [facility]  . HTN (hypertension)   . Hx of colonic polyp 03/12/2017   7 mm hyperplastic transverse recall 2023  . Hyperlipidemia   . 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 Not needing corneal transplant.   . Osteoarthritis    Left hip  . Substance abuse ([place])    tobacco, alcohol  . Vaginal bleeding, abnormal June 2010   Thought to be 2/2 DUB vs endometrial CA. Gyn referral made at that time.   Review of Systems: Negative except mentioned in HPI.  Physical Exam:  Vitals:   12/02/17 1419  BP: (!) 155/75  Pulse: 93  Temp: 97.8 F (36.6 C)  TempSrc: Oral  SpO2: 100%  Weight: 192 lb 12.8 oz (87.5 kg)    General: Vital signs reviewed.  Patient is well-developed  and well-nourished, in no acute distress and cooperative with exam.  Head: Normocephalic and atraumatic. Eyes: EOMI, conjunctivae normal, no scleral icterus.  Cardiovascular: RRR, S1 normal, S2 normal, no murmurs, gallops, or rubs. Pulmonary/Chest: Clear to auscultation bilaterally, no wheezes, rales, or rhonchi. Abdominal: Soft, non-tender, non-distended, BS +, no masses, organomegaly, or guarding present.  Musculoskeletal: No joint deformities, erythema, or stiffness, no erythema, edema or tenderness of left shoulder, unable to reach overhead/full shoulder flexion with active movement, normal range of passive movement.  No other focal deficit. Extremities: No lower extremity edema bilaterally,  pulses symmetric and intact bilaterally. No cyanosis or clubbing. Skin: Warm, dry and intact. No rashes or erythema. Psychiatric: Normal mood and affect. speech and behavior is normal. Cognition and memory are normal.  Assessment & Plan:   See Encounters Tab for problem based charting.  Patient discussed with Dr. Lynnae January.

## 2017-12-02 NOTE — Assessment & Plan Note (Signed)
She has well-controlled diabetes with A1c done in January 2019 was 5.8.  She was given refills on her Metformin 500 mg twice daily.

## 2017-12-02 NOTE — Assessment & Plan Note (Signed)
BP Readings from Last 3 Encounters:  12/02/17 120/63  10/12/17 126/70  09/10/17 (!) 149/78   She was normotensive today.  Continue current management, she was given new refills.

## 2017-12-02 NOTE — Assessment & Plan Note (Addendum)
She is experiencing nonspecific leg cramps, as she was worried about her potassium being low, we will check her BMP.  If she is hypokalemic we will replace her potassium.  She was told to try B complex as it can help with leg cramps. If her leg cramps get worse or she is experiencing daily cramps we can start her on gabapentin to see if that will help.  Addendum.  She has potassium of 3.6, within lower normal limit.  Her potassium stays within low normal limit for long time.  Called the patient and informed her regarding the results.

## 2017-12-02 NOTE — Assessment & Plan Note (Addendum)
She had  history of injury to her left shoulder around Thanksgiving last year. She is not experiencing any pain, just unable to raise her left above head. She might have injured rotator cuff during that injury which is causing left shoulder weakness.  Her [place] [facility] letter has been expired, she will try to renew it so we can send her for physical therapy. She was provided with some shoulder exercises to strengthen her left shoulder.  Addendum.  Patient got  her [place] card-referral for physical therapy was placed.

## 2017-12-03 LAB — BMP8+ANION GAP
Anion Gap: 17 mmol/L (ref 10.0–18.0)
BUN/Creatinine Ratio: 15 (ref 9–23)
BUN: 10 mg/dL (ref 6–24)
CALCIUM: 9.3 mg/dL (ref 8.7–10.2)
CO2: 25 mmol/L (ref 20–29)
Chloride: 98 mmol/L (ref 96–106)
Creatinine, Ser: 0.66 mg/dL (ref 0.57–1.00)
GFR calc Af Amer: 112 mL/min/{1.73_m2} (ref >59)
GFR calc non Af Amer: 97 mL/min/{1.73_m2} (ref >59)
Glucose: 116 mg/dL — ABNORMAL HIGH (ref 65–99)
Potassium: 3.6 mmol/L (ref 3.5–5.2)
Sodium: 140 mmol/L (ref 134–144)

## 2017-12-04 NOTE — Progress Notes (Signed)
Internal Medicine Clinic Attending  Case discussed with Dr. Amin at the time of the visit.  We reviewed the resident's history and exam and pertinent patient test results.  I agree with the assessment, diagnosis, and plan of care documented in the resident's note.    

## 2017-12-09 NOTE — Addendum Note (Signed)
Addended by: Lorella Nimrod on: 12/09/2017 01:33 PM   Modules accepted: Orders

## 2017-12-28 ENCOUNTER — Ambulatory Visit: Payer: Self-pay | Attending: Internal Medicine | Admitting: Physical Therapy

## 2017-12-28 ENCOUNTER — Encounter: Payer: Self-pay | Admitting: Physical Therapy

## 2017-12-28 DIAGNOSIS — M25512 Pain in left shoulder: Secondary | ICD-10-CM | POA: Insufficient documentation

## 2017-12-28 DIAGNOSIS — M6281 Muscle weakness (generalized): Secondary | ICD-10-CM | POA: Insufficient documentation

## 2017-12-28 DIAGNOSIS — M25612 Stiffness of left shoulder, not elsewhere classified: Secondary | ICD-10-CM | POA: Insufficient documentation
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
## 2018-01-04 ENCOUNTER — Ambulatory Visit: Payer: Self-pay | Admitting: Physical Therapy

## 2018-01-04 ENCOUNTER — Encounter: Payer: Self-pay | Admitting: Physical Therapy

## 2018-01-04 DIAGNOSIS — M25512 Pain in left shoulder: Secondary | ICD-10-CM

## 2018-01-04 DIAGNOSIS — M25612 Stiffness of left shoulder, not elsewhere classified: Secondary | ICD-10-CM

## 2018-01-04 DIAGNOSIS — M6281 Muscle weakness (generalized): Secondary | ICD-10-CM

## 2018-01-04 NOTE — Therapy (Signed)
[place], [place], [address] Phone: [phone]   Fax:  [phone]  Physical Therapy Treatment  Patient Details  Name: Cassandra Park MRN: [id number] Date of Birth: [birth year] Referring Provider: S. Amin   Encounter Date: 01/04/2018  PT End of Session - 01/04/18 1516    Visit Number  2    Date for PT Re-Evaluation  03/11/18    Authorization Type  CAFA    PT Start Time  1426    PT Stop Time  1515    PT Time Calculation (min)  49 min    Activity Tolerance  Patient tolerated treatment well    Behavior During Therapy  WFL for tasks assessed/performed       Past Medical History:  Diagnosis Date  . Allergic rhinitis   . Allergy   . Diabetes mellitus without complication ([place])   . GERD (gastroesophageal reflux disease)   . Heart murmur   . 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 2 pap smears since then normal. Repeat pap in 3/07 at [facility],.  . HTN (hypertension)   . Hx of colonic polyp 03/12/2017   7 mm hyperplastic transverse recall 2023  . Hyperlipidemia   . Keratoconus    f/u @ [facility] eye center. Not needing corneal transplant.   . Osteoarthritis    Left hip  . Substance abuse ([place])    tobacco, alcohol  . Vaginal bleeding, abnormal June 2010   Thought to be 2/2 DUB vs endometrial CA. Gyn referral made at that time.    Past Surgical History:  Procedure Laterality Date  . TONSILLECTOMY      There were no vitals filed for this visit.  Subjective Assessment - 01/04/18 1430    Pt reports that she is able to move her arm higher and better after starting PT  Pain Score     0-No pain                       [facility] Adult PT Treatment/Exercise - 01/04/18 0001      Shoulder Exercises: Standing   Theraband Level (Shoulder Horizontal ABduction)  Level 2 (Red)    Horizontal ABduction Limitations  2x10    External Rotation  AROM;Strengthening;Left;20  reps    Theraband Level (Shoulder External Rotation)  Level 2 (Red)    External Rotation Limitations  -- 2x10 pt need vc for proper technique    Internal Rotation  AROM;Strengthening;Left;5 reps AAROM with towel 2x10    Internal Rotation Limitations  pt unable to do yellow TB    Extension  AAROM;Both;20 reps    Other Standing Exercises  LUE unweighted ball put on shelf; bilateral  UE ball on shelf    Other Standing Exercises  2x10 pronation with cane       Shoulder Exercises: ROM/Strengthening   UBE (Upper Arm Bike)  L2 48fwd/3back    Thumb Tacks  5x flexion and 3x abduction               PT Short Term Goals - 12/28/17 1432      PT SHORT TERM GOAL #1   Title  independent with initial HEP    Time  2    Period  Weeks    Status  New        PT Long Term Goals - 12/28/17 1432      PT LONG  TERM GOAL #1   Title  increase left shoulder IR to bra    Time  8    Period  Weeks    Status  New      PT LONG TERM GOAL #2   Title  increase flexion of the left shoulder to 130 degrees    Time  8    Period  Weeks    Status  New      PT LONG TERM GOAL #3   Title  report no difficulty putting on deodorant    Time  8    Period  Weeks    Status  New      PT LONG TERM GOAL #4   Title  report no difficulty getting racks out of warmer at work    Time  8    Period  Weeks    Status  New            Plan - 01/04/18 1517    Clinical Impression Statement  Pt is limited with L forearm pronation and supination in addition to shoulder L AROM in flexion, IR and ER. She needed VC and tactile cuing to depress scapula. She verbally reports that she is doing her HEP at home and that her shoulder has had some improvement since the last time she was here.     Rehab Potential  Fair    PT Frequency  2x / week    PT Duration  8 weeks    PT Treatment/Interventions  ADLs/Self Care Home Management;Electrical Stimulation;Iontophoresis 4mg /ml Dexamethasone;Moist Heat;Therapeutic  exercise;Therapeutic activities;Patient/family education;Manual techniques    PT Next Visit Plan  Scapular stabilization, strengthen RC and incoporate forearm supination    Consulted and Agree with Plan of Care  Patient       Patient will benefit from skilled therapeutic intervention in order to improve the following deficits and impairments:  Decreased strength, Postural dysfunction, Improper body mechanics, Impaired UE functional use, Increased muscle spasms, Decreased range of motion  Visit Diagnosis: Stiffness of left shoulder, not elsewhere classified  Muscle weakness (generalized)  Acute pain of left shoulder     Problem List Patient Active Problem List   Diagnosis Date Noted  . Leg cramps 12/02/2017  . Weakness of shoulder 12/02/2017  . Hx of colonic polyp 03/12/2017  . Type 2 diabetes mellitus ([place]) 11/06/2016  . GERD (gastroesophageal reflux disease) 05/06/2016  . Advanced directives, counseling/discussion 09/18/2015  . Overweight (BMI 25.0-29.9) 04/25/2014  . Allergic rhinitis 01/27/2012  . Preventative health care 01/27/2012  . Hyperlipidemia 10/19/2006  . Essential hypertension 10/19/2006  . Osteoarthritis 10/19/2006    Loyal Gambler 01/04/2018, 3:33 PM  [place] [address] [place], [place], [address] Phone: [phone]   Fax:  [phone]  Name: Cassandra Park MRN: [id number] Date of Birth: 06/19/1961

## 2018-01-06 ENCOUNTER — Ambulatory Visit: Payer: Self-pay | Admitting: Physical Therapy

## 2018-01-12 ENCOUNTER — Ambulatory Visit: Payer: Self-pay | Admitting: Physical Therapy

## 2018-01-12 ENCOUNTER — Encounter: Payer: Self-pay | Admitting: Physical Therapy

## 2018-01-12 DIAGNOSIS — M6281 Muscle weakness (generalized): Secondary | ICD-10-CM

## 2018-01-12 DIAGNOSIS — M25612 Stiffness of left shoulder, not elsewhere classified: Secondary | ICD-10-CM

## 2018-01-12 DIAGNOSIS — M25512 Pain in left shoulder: Secondary | ICD-10-CM

## 2018-01-12 NOTE — Therapy (Signed)
[place], [place], [address] Phone: [phone]   Fax:  [phone]  Physical Therapy Treatment  Patient Details  Name: Cassandra Park MRN: [id number] Date of Birth: August 18, 1961 Referring Provider: S. Amin   Encounter Date: 01/12/2018  PT End of Session - 01/12/18 1443    Visit Number  3    Date for PT Re-Evaluation  03/11/18    Authorization Type  CAFA    PT Start Time  1403    PT Stop Time  1443    PT Time Calculation (min)  40 min    Activity Tolerance  Patient tolerated treatment well    Behavior During Therapy  [facility] for tasks assessed/performed       Past Medical History:  Diagnosis Date  . Allergic rhinitis   . Allergy   . Diabetes mellitus without complication ([place])   . GERD (gastroesophageal reflux disease)   . Heart murmur   . 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 2 pap smears since then normal. Repeat pap in 3/07 at [facility],.  . HTN (hypertension)   . Hx of colonic polyp 03/12/2017   7 mm hyperplastic transverse recall 2023  . Hyperlipidemia   . Keratoconus    f/u @ [facility] eye center. Not needing corneal transplant.   . Osteoarthritis    Left hip  . Substance abuse ([place])    tobacco, alcohol  . Vaginal bleeding, abnormal June 2010   Thought to be 2/2 DUB vs endometrial CA. Gyn referral made at that time.    Past Surgical History:  Procedure Laterality Date  . TONSILLECTOMY      There were no vitals filed for this visit.  Subjective Assessment - 01/12/18 1405    Subjective  Pt reports that she feel like she is doing better, she stated that she can get her food out of the warmer    Currently in Pain?  No/denies    Pain Score  0-No pain                       [facility] Adult PT Treatment/Exercise - 01/12/18 0001      Shoulder Exercises: Supine   Flexion  10 reps;Left 2lb      Shoulder Exercises: Standing   External Rotation   AROM;Strengthening;Left;20 reps;Theraband    Theraband Level (Shoulder External Rotation)  Level 1 (Yellow)    Flexion  AROM;20 reps;Both    ABduction  AROM;Both;20 reps    Other Standing Exercises  shruggs 4lb 2x10       Shoulder Exercises: ROM/Strengthening   UBE (Upper Arm Bike)  L2 37fwd/3back      Shoulder Exercises: Power Futures trader  rows 10lb and lats 20lb 2x10       Manual Therapy   Manual Therapy  Joint mobilization;Soft tissue mobilization;Passive ROM    Joint Mobilization  GH grades 1-2    Soft tissue mobilization  anterior L deltoid    Passive ROM  L shoulder all directions               PT Short Term Goals - 12/28/17 1432      PT SHORT TERM GOAL #1   Title  independent with initial HEP    Time  2    Period  Weeks    Status  New  PT Long Term Goals - 12/28/17 1432      PT LONG TERM GOAL #1   Title  increase left shoulder IR to bra    Time  8    Period  Weeks    Status  New      PT LONG TERM GOAL #2   Title  increase flexion of the left shoulder to 130 degrees    Time  8    Period  Weeks    Status  New      PT LONG TERM GOAL #3   Title  report no difficulty putting on deodorant    Time  8    Period  Weeks    Status  New      PT LONG TERM GOAL #4   Title  report no difficulty getting racks out of warmer at work    Time  8    Period  Weeks    Status  New            Plan - 01/12/18 1444    Clinical Impression Statement  Pt with weakness with L  forearm supination and elbow flexion. Pt uses compensatory movements when asked to isolate elbow flexion or forearm supination. She reports that she is doing better overall with improved functional mobility at work. Guarded at times with MT, difficulty relaxing. Postural cues with seated rows and shoulder ER.     Rehab Potential  Fair    PT Frequency  2x / week    PT Duration  8 weeks    PT Treatment/Interventions  ADLs/Self Care Home Management;Electrical  Stimulation;Iontophoresis 4mg /ml Dexamethasone;Moist Heat;Therapeutic exercise;Therapeutic activities;Patient/family education;Manual techniques    PT Next Visit Plan  Scapular stabilization, strengthen RC and incoporate forearm supination       Patient will benefit from skilled therapeutic intervention in order to improve the following deficits and impairments:  Decreased strength, Postural dysfunction, Improper body mechanics, Impaired UE functional use, Increased muscle spasms, Decreased range of motion  Visit Diagnosis: Muscle weakness (generalized)  Stiffness of left shoulder, not elsewhere classified  Acute pain of left shoulder     Problem List Patient Active Problem List   Diagnosis Date Noted  . Leg cramps 12/02/2017  . Weakness of shoulder 12/02/2017  . Hx of colonic polyp 03/12/2017  . Type 2 diabetes mellitus ([place]) 11/06/2016  . GERD (gastroesophageal reflux disease) 05/06/2016  . Advanced directives, counseling/discussion 09/18/2015  . Overweight (BMI 25.0-29.9) 04/25/2014  . Allergic rhinitis 01/27/2012  . Preventative health care 01/27/2012  . Hyperlipidemia 10/19/2006  . Essential hypertension 10/19/2006  . Osteoarthritis 10/19/2006    Scot Jun, PTA 01/12/2018, 2:47 PM  [facility] [place], [place], [address] Phone: [phone]   Fax:  [phone]  Name: Cassandra Park MRN: [id number] Date of Birth: 1961/04/12

## 2018-01-13 ENCOUNTER — Ambulatory Visit: Payer: Self-pay | Attending: Internal Medicine | Admitting: Physical Therapy

## 2018-01-13 DIAGNOSIS — M25512 Pain in left shoulder: Secondary | ICD-10-CM | POA: Insufficient documentation

## 2018-01-13 DIAGNOSIS — M6281 Muscle weakness (generalized): Secondary | ICD-10-CM | POA: Insufficient documentation

## 2018-01-13 DIAGNOSIS — M25612 Stiffness of left shoulder, not elsewhere classified: Secondary | ICD-10-CM | POA: Insufficient documentation

## 2018-01-13 NOTE — Therapy (Signed)
[place], [place], [address] Phone: [phone]   Fax:  [phone]  Physical Therapy Treatment  Patient Details  Name: Cassandra Park MRN: [id number] Date of Birth: June 14, 1961 Referring Provider: S. Amin   Encounter Date: 01/13/2018  PT End of Session - 01/13/18 1105    Visit Number  4    Date for PT Re-Evaluation  03/11/18    Authorization Type  CAFA    PT Start Time  1100    PT Stop Time  1148    PT Time Calculation (min)  48 min    Activity Tolerance  Patient tolerated treatment well    Behavior During Therapy  WFL for tasks assessed/performed       Past Medical History:  Diagnosis Date  . Allergic rhinitis   . Allergy   . Diabetes mellitus without complication ([place])   . GERD (gastroesophageal reflux disease)   . Heart murmur   . 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 2 pap smears since then normal. Repeat pap in 3/07 at [facility],.  . HTN (hypertension)   . Hx of colonic polyp 03/12/2017   7 mm hyperplastic transverse recall 2023  . Hyperlipidemia   . Keratoconus    f/u @ [facility] center. Not needing corneal transplant.   . Osteoarthritis    Left hip  . Substance abuse ([place])    tobacco, alcohol  . Vaginal bleeding, abnormal June 2010   Thought to be 2/2 DUB vs endometrial CA. Gyn referral made at that time.    Past Surgical History:  Procedure Laterality Date  . TONSILLECTOMY      There were no vitals filed for this visit.  Subjective Assessment - 01/13/18 1104    Subjective  Pt. reporting, "i've been able to use my arm more recently with less pain".      Limitations  Lifting;House hold activities    Patient Stated Goals  have better motions, be stronger, easier at work    Currently in Pain?  No/denies    Pain Score  0-No pain    Multiple Pain Sites  No                       [facility] Adult PT Treatment/Exercise - 01/13/18 1115       Elbow Exercises   Elbow Flexion  Left;15 reps;AROM;Seated difficulty maintaining supination; mirror feedback       Shoulder Exercises: Prone   Retraction  Left;15 reps;Weights;Strengthening    Retraction Weight (lbs)  2    Retraction Limitations  Tactile cues for scapular depression/retraction     Extension  Left;15 reps    Extension Weight (lbs)  1    Extension Limitations  Tactile cues for scap. depression       Shoulder Exercises: Standing   External Rotation  AROM;Strengthening;Left;20 reps;Theraband    Theraband Level (Shoulder External Rotation)  Level 1 (Yellow)    External Rotation Limitations  Required cues to maintain 90 dg at elbow     Internal Rotation  AROM;Strengthening;Left;10 reps;Theraband    Theraband Level (Shoulder Internal Rotation)  Level 1 (Yellow)    Internal Rotation Limitations  Difficulty maintaining 90 dg at elbow     Extension  Both;15 reps;Theraband    Theraband Level (Shoulder Extension)  Level 1 (Yellow)    Row  Both;15 reps;Theraband    Theraband Level (Shoulder Row)  Level 2 (Red)    Other Standing Exercises  L lat pushdown with red TB closed high in door (maintaining straightened L UE) 3" x 10 reps       Shoulder Exercises: ROM/Strengthening   UBE (Upper Arm Bike)  L2 94fwd/3back      Manual Therapy   Manual Therapy  Soft tissue mobilization;Passive ROM    Manual therapy comments  supine     Soft tissue mobilization  STM to L deltoid, L UT, L teres group in area of tenderness     Passive ROM  L shoulder all directions               PT Short Term Goals - 01/13/18 1108      PT SHORT TERM GOAL #1   Title  independent with initial HEP    Time  2    Period  Weeks    Status  On-going        PT Long Term Goals - 01/13/18 1111      PT LONG TERM GOAL #1   Title  increase left shoulder IR to bra    Time  8    Period  Weeks    Status  On-going      PT LONG TERM GOAL #2   Title  increase flexion of the left shoulder to 130 degrees     Time  8    Period  Weeks    Status  On-going      PT LONG TERM GOAL #3   Title  report no difficulty putting on deodorant    Time  8    Period  Weeks    Status  On-going      PT LONG TERM GOAL #4   Title  report no difficulty getting racks out of warmer at work    Time  8    Period  Weeks    Status  On-going            Plan - 01/13/18 1112    Clinical Impression Statement  Pt. reporting she felt fine after last visit.  Tolerated all scapular strengthening activities in treatment well today.  Most difficulty maintaining supination and with active biceps curl today with only mild improvement with mirror feedback and therapist assistance.  Will continue to progress toward goals in coming visits.        PT Treatment/Interventions  ADLs/Self Care Home Management;Electrical Stimulation;Iontophoresis 4mg /ml Dexamethasone;Moist Heat;Therapeutic exercise;Therapeutic activities;Patient/family education;Manual techniques    PT Next Visit Plan  Scapular stabilization, strengthen RC and incoporate forearm supination    Consulted and Agree with Plan of Care  Patient       Patient will benefit from skilled therapeutic intervention in order to improve the following deficits and impairments:  Decreased strength, Postural dysfunction, Improper body mechanics, Impaired UE functional use, Increased muscle spasms, Decreased range of motion  Visit Diagnosis: Muscle weakness (generalized)  Stiffness of left shoulder, not elsewhere classified  Acute pain of left shoulder     Problem List Patient Active Problem List   Diagnosis Date Noted  . Leg cramps 12/02/2017  . Weakness of shoulder 12/02/2017  . Hx of colonic polyp 03/12/2017  . Type 2 diabetes mellitus ([place]) 11/06/2016  . GERD (gastroesophageal reflux disease) 05/06/2016  . Advanced directives, counseling/discussion 09/18/2015  . Overweight (BMI 25.0-29.9) 04/25/2014  . Allergic rhinitis 01/27/2012  . Preventative health care  01/27/2012  . Hyperlipidemia 10/19/2006  . Essential hypertension 10/19/2006  . Osteoarthritis 10/19/2006  Bess Harvest, PTA 01/13/18 12:03 PM   [address] [place], [place], [address] Phone: [phone]   Fax:  [phone]  Name: RAYMONDE HAMBLIN MRN: [id number] Date of Birth: 1961/02/19

## 2018-01-19 ENCOUNTER — Encounter: Payer: Self-pay | Admitting: Physical Therapy

## 2018-01-19 ENCOUNTER — Ambulatory Visit: Payer: Self-pay | Admitting: Physical Therapy

## 2018-01-19 DIAGNOSIS — M25612 Stiffness of left shoulder, not elsewhere classified: Secondary | ICD-10-CM

## 2018-01-19 DIAGNOSIS — M6281 Muscle weakness (generalized): Secondary | ICD-10-CM

## 2018-01-19 DIAGNOSIS — M25512 Pain in left shoulder: Secondary | ICD-10-CM

## 2018-01-19 NOTE — Therapy (Signed)
[place], [place], [address] Phone: [phone]   Fax:  [phone]  Physical Therapy Treatment  Patient Details  Name: Cassandra Park MRN: [id number] Date of Birth: Oct 01, 1960 Referring Provider: S. Amin   Encounter Date: 01/19/2018  PT End of Session - 01/19/18 1428    Visit Number  5    Date for PT Re-Evaluation  03/11/18    PT Start Time  1345    PT Stop Time  1428    PT Time Calculation (min)  43 min    Activity Tolerance  Patient tolerated treatment well    Behavior During Therapy  WFL for tasks assessed/performed       Past Medical History:  Diagnosis Date  . Allergic rhinitis   . Allergy   . Diabetes mellitus without complication ([place])   . GERD (gastroesophageal reflux disease)   . Heart murmur   . 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 2 pap smears since then normal. [facility] in 3/07 at [facility],.  . HTN (hypertension)   . Hx of colonic polyp 03/12/2017   7 mm hyperplastic transverse recall 2023  . Hyperlipidemia   . Keratoconus    f/u @ [facility] eye center. Not needing corneal transplant.   . Osteoarthritis    Left hip  . Substance abuse ([place])    tobacco, alcohol  . Vaginal bleeding, abnormal June 2010   Thought to be 2/2 DUB vs endometrial CA. Gyn referral made at that time.    Past Surgical History:  Procedure Laterality Date  . TONSILLECTOMY      There were no vitals filed for this visit.  Subjective Assessment - 01/19/18 1348    Subjective  "Better, I have been reaching up real good to get my food out the warmer."    Currently in Pain?  No/denies    Pain Score  0-No pain                       [facility] Adult PT Treatment/Exercise - 01/19/18 0001      Elbow Exercises   Elbow Flexion  Both;10 reps;Standing;Bar weights/barbell 2lb x2      Shoulder Exercises: Standing   External Rotation  AROM;Strengthening;Left;20 reps;Theraband     Theraband Level (Shoulder External Rotation)  Level 1 (Yellow)    External Rotation Limitations  Required cues to maintain 90 dg at elbow     Internal Rotation  AROM;Strengthening;Left;10 reps;Theraband    Theraband Level (Shoulder Internal Rotation)  Level 1 (Yellow)    Flexion  Strengthening;Both;Weights;20 reps cane     Shoulder Flexion Weight (lbs)  2    Extension  Both;Theraband;20 reps    Theraband Level (Shoulder Extension)  Level 3 (Green)    Row  Both;15 reps;Theraband x2    Theraband Level (Shoulder Row)  Level 3 (Green)    Other Standing Exercises  shruggs 4lb 2x10       Shoulder Exercises: ROM/Strengthening   UBE (Upper Arm Bike)  L2 68fwd/3back      Manual Therapy   Manual Therapy  Soft tissue mobilization;Passive ROM    Manual therapy comments  supine     Soft tissue mobilization  STM to L deltoid, L UT, L teres group in area of tenderness     Passive ROM  L shoulder all directions  PT Short Term Goals - 01/13/18 1108      PT SHORT TERM GOAL #1   Title  independent with initial HEP    Time  2    Period  Weeks    Status  On-going        PT Long Term Goals - 01/13/18 1111      PT LONG TERM GOAL #1   Title  increase left shoulder IR to bra    Time  8    Period  Weeks    Status  On-going      PT LONG TERM GOAL #2   Title  increase flexion of the left shoulder to 130 degrees    Time  8    Period  Weeks    Status  On-going      PT LONG TERM GOAL #3   Title  report no difficulty putting on deodorant    Time  8    Period  Weeks    Status  On-going      PT LONG TERM GOAL #4   Title  report no difficulty getting racks out of warmer at work    Time  8    Period  Weeks    Status  On-going            Plan - 01/19/18 1429    Clinical Impression Statement  Pt reports that her L arm is better overall, She has a lot of weakness with L elbow flexion and forearm supination.  Overall she does well with the exercises, cues to  maintain elbow flex to 90 with IR/ER. Good PROM of L shoulder.    Rehab Potential  Fair    PT Frequency  2x / week    PT Duration  8 weeks    PT Treatment/Interventions  ADLs/Self Care Home Management;Electrical Stimulation;Iontophoresis 4mg /ml Dexamethasone;Moist Heat;Therapeutic exercise;Therapeutic activities;Patient/family education;Manual techniques    PT Next Visit Plan  Scapular stabilization, strengthen RC and incoporate forearm supination       Patient will benefit from skilled therapeutic intervention in order to improve the following deficits and impairments:  Decreased strength, Postural dysfunction, Improper body mechanics, Impaired UE functional use, Increased muscle spasms, Decreased range of motion  Visit Diagnosis: Stiffness of left shoulder, not elsewhere classified  Muscle weakness (generalized)  Acute pain of left shoulder     Problem List Patient Active Problem List   Diagnosis Date Noted  . Leg cramps 12/02/2017  . Weakness of shoulder 12/02/2017  . Hx of colonic polyp 03/12/2017  . Type 2 diabetes mellitus ([place]) 11/06/2016  . GERD (gastroesophageal reflux disease) 05/06/2016  . Advanced directives, counseling/discussion 09/18/2015  . Overweight (BMI 25.0-29.9) 04/25/2014  . Allergic rhinitis 01/27/2012  . Preventative health care 01/27/2012  . Hyperlipidemia 10/19/2006  . Essential hypertension 10/19/2006  . Osteoarthritis 10/19/2006    Scot Jun, PTA 01/19/2018, 2:31 PM  [place], [place], [address] Phone: [phone]   Fax:  [phone]  Name: AMOUR TRIGG MRN: [id number] Date of Birth: [birth year]

## 2018-01-20 ENCOUNTER — Ambulatory Visit: Payer: Self-pay | Admitting: Physical Therapy

## 2018-01-26 ENCOUNTER — Ambulatory Visit: Payer: Self-pay

## 2018-01-26 ENCOUNTER — Ambulatory Visit: Payer: Self-pay | Admitting: Physical Therapy

## 2018-01-28 ENCOUNTER — Ambulatory Visit: Payer: Self-pay | Admitting: Physical Therapy

## 2018-01-28 ENCOUNTER — Encounter: Payer: Self-pay | Admitting: Physical Therapy

## 2018-01-28 DIAGNOSIS — M25512 Pain in left shoulder: Secondary | ICD-10-CM

## 2018-01-28 DIAGNOSIS — M6281 Muscle weakness (generalized): Secondary | ICD-10-CM

## 2018-01-28 DIAGNOSIS — M25612 Stiffness of left shoulder, not elsewhere classified: Secondary | ICD-10-CM

## 2018-01-28 NOTE — Therapy (Signed)
[place], [place], [address] Phone: [phone]   Fax:  [phone]  Physical Therapy Treatment  Patient Details  Name: Cassandra Park MRN: [id number] Date of Birth: [birth year] Referring Provider: S. Amin   Encounter Date: 01/28/2018  PT End of Session - 01/28/18 1508    Visit Number  6    Date for PT Re-Evaluation  03/11/18    Authorization Type  CAFA    PT Start Time  1422    PT Stop Time  1508    PT Time Calculation (min)  46 min    Activity Tolerance  Patient tolerated treatment well    Behavior During Therapy  WFL for tasks assessed/performed       Past Medical History:  Diagnosis Date  . Allergic rhinitis   . Allergy   . Diabetes mellitus without complication ([place])   . GERD (gastroesophageal reflux disease)   . Heart murmur   . 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 2 pap smears since then normal. [facility] in 3/07 at [facility],.  . HTN (hypertension)   . Hx of colonic polyp 03/12/2017   7 mm hyperplastic transverse recall 2023  . Hyperlipidemia   . 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. Not needing corneal transplant.   . Osteoarthritis    Left hip  . Substance abuse ([place])    tobacco, alcohol  . Vaginal bleeding, abnormal June 2010   Thought to be 2/2 DUB vs endometrial CA. Gyn referral made at that time.    Past Surgical History:  Procedure Laterality Date  . TONSILLECTOMY      There were no vitals filed for this visit.  Subjective Assessment - 01/28/18 1424    Subjective  "It is going, It is getting better"    Currently in Pain?  No/denies    Pain Score  0-No pain         [facility] PT Assessment - 01/28/18 0001      AROM   AROM Assessment Site  Shoulder    Left Shoulder Flexion  150 Degrees    Left Shoulder ABduction  139 Degrees    Left Shoulder Internal Rotation  45 Degrees    Left Shoulder External Rotation  36 Degrees      Strength   Left Shoulder Flexion  4/5    Left Shoulder Internal Rotation  4/5    Left Shoulder External Rotation  4-/5                   [facility] Adult PT Treatment/Exercise - 01/28/18 0001      Shoulder Exercises: Supine   External Rotation  Left;20 reps;Weights    External Rotation Weight (lbs)  1    Internal Rotation  Left;20 reps;Weights    Internal Rotation Weight (lbs)  1    Flexion  Left;20 reps;Weights    Shoulder Flexion Weight (lbs)  2    Diagonals  Left;10 reps      Shoulder Exercises: Standing   Flexion  Strengthening;Both;Weights;20 reps    Shoulder Flexion Weight (lbs)  1      Shoulder Exercises: ROM/Strengthening   UBE (Upper Arm Bike)  L2 18fwd/3back      Shoulder Exercises: Power Futures trader  rows 20lb and lats 20lb 2x10       Manual Therapy   Manual Therapy  Passive  ROM    Passive ROM  L shoulder all directions               PT Short Term Goals - 01/13/18 1108      PT SHORT TERM GOAL #1   Title  independent with initial HEP    Time  2    Period  Weeks    Status  On-going        PT Long Term Goals - 01/13/18 1111      PT LONG TERM GOAL #1   Title  increase left shoulder IR to bra    Time  8    Period  Weeks    Status  On-going      PT LONG TERM GOAL #2   Title  increase flexion of the left shoulder to 130 degrees    Time  8    Period  Weeks    Status  On-going      PT LONG TERM GOAL #3   Title  report no difficulty putting on deodorant    Time  8    Period  Weeks    Status  On-going      PT LONG TERM GOAL #4   Title  report no difficulty getting racks out of warmer at work    Time  8    Period  Weeks    Status  On-going            Plan - 01/28/18 1510    Clinical Impression Statement  Pt is progressing toward all goals, She has increase her L shoulder strength and ROM. PROM if good [facility] for all motions. Pt does have difficulty with L elbow flexion with her hand supinated. Difficulty supinating  hand as well. Pt has little to no bicep activation    Rehab Potential  Fair    PT Frequency  2x / week    PT Duration  8 weeks    PT Treatment/Interventions  ADLs/Self Care Home Management;Electrical Stimulation;Iontophoresis 4mg /ml Dexamethasone;Moist Heat;Therapeutic exercise;Therapeutic activities;Patient/family education;Manual techniques    PT Next Visit Plan  Scapular stabilization, strengthen RC and incorporate forearm supination       Patient will benefit from skilled therapeutic intervention in order to improve the following deficits and impairments:  Decreased strength, Postural dysfunction, Improper body mechanics, Impaired UE functional use, Increased muscle spasms, Decreased range of motion  Visit Diagnosis: Stiffness of left shoulder, not elsewhere classified  Muscle weakness (generalized)  Acute pain of left shoulder     Problem List Patient Active Problem List   Diagnosis Date Noted  . Leg cramps 12/02/2017  . Weakness of shoulder 12/02/2017  . Hx of colonic polyp 03/12/2017  . Type 2 diabetes mellitus ([place]) 11/06/2016  . GERD (gastroesophageal reflux disease) 05/06/2016  . Advanced directives, counseling/discussion 09/18/2015  . Overweight (BMI 25.0-29.9) 04/25/2014  . Allergic rhinitis 01/27/2012  . Preventative health care 01/27/2012  . Hyperlipidemia 10/19/2006  . Essential hypertension 10/19/2006  . Osteoarthritis 10/19/2006    Scot Jun, PTA 01/28/2018, 3:12 PM  [place], [place], [address] Phone: [phone]   Fax:  [phone]  Name: Cassandra Park MRN: [id number] Date of Birth: 06/26/1961

## 2018-02-04 ENCOUNTER — Ambulatory Visit: Payer: Self-pay | Admitting: Physical Therapy

## 2018-02-11 ENCOUNTER — Ambulatory Visit: Payer: Self-pay | Admitting: Physical Therapy

## 2018-02-11 ENCOUNTER — Encounter: Payer: Self-pay | Admitting: Physical Therapy

## 2018-02-11 DIAGNOSIS — M25512 Pain in left shoulder: Secondary | ICD-10-CM

## 2018-02-11 DIAGNOSIS — M25612 Stiffness of left shoulder, not elsewhere classified: Secondary | ICD-10-CM

## 2018-02-11 DIAGNOSIS — M6281 Muscle weakness (generalized): Secondary | ICD-10-CM

## 2018-02-11 NOTE — Therapy (Signed)
[place], [place], [address] Phone: [phone]   Fax:  [phone]  Physical Therapy Treatment  Patient Details  Name: Cassandra Park MRN: [id number] Date of Birth: 1960/11/09 Referring Provider: S. Amin   Encounter Date: 02/11/2018  PT End of Session - 02/11/18 1512    Visit Number  7    Date for PT Re-Evaluation  03/11/18    Authorization Type  CAFA    PT Start Time  1430    PT Stop Time  1514    PT Time Calculation (min)  44 min    Activity Tolerance  Patient tolerated treatment well    Behavior During Therapy  WFL for tasks assessed/performed       Past Medical History:  Diagnosis Date  . Allergic rhinitis   . Allergy   . Diabetes mellitus without complication ([place])   . GERD (gastroesophageal reflux disease)   . Heart murmur   . 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 2 pap smears since then normal. Repeat pap in 3/07 at [facility],.  . HTN (hypertension)   . Hx of colonic polyp 03/12/2017   7 mm hyperplastic transverse recall 2023  . Hyperlipidemia   . Keratoconus    f/u @ [facility] eye center. Not needing corneal transplant.   . Osteoarthritis    Left hip  . Substance abuse ([place])    tobacco, alcohol  . Vaginal bleeding, abnormal June 2010   Thought to be 2/2 DUB vs endometrial CA. Gyn referral made at that time.    Past Surgical History:  Procedure Laterality Date  . TONSILLECTOMY      There were no vitals filed for this visit.  Subjective Assessment - 02/11/18 1425    Subjective  "It is going fine"    Currently in Pain?  No/denies    Pain Score  0-No pain                       [facility] Adult PT Treatment/Exercise - 02/11/18 0001      Shoulder Exercises: Supine   External Rotation  Left;20 reps;Weights    External Rotation Weight (lbs)  2    Internal Rotation  Left;20 reps;Weights    Internal Rotation Weight (lbs)  2    Flexion  Left;20  reps;Weights    Shoulder Flexion Weight (lbs)  2      Shoulder Exercises: Standing   External Rotation  AROM;Strengthening;Left;20 reps;Theraband    Theraband Level (Shoulder External Rotation)  Level 1 (Yellow)    Flexion  Strengthening;Both;Weights;20 reps    Shoulder Flexion Weight (lbs)  1    Extension  Both;Theraband;15 reps x2    Theraband Level (Shoulder Extension)  Level 3 (Green)    Row  Both;15 reps;Theraband x2    Theraband Level (Shoulder Row)  Level 2 (Red)    Other Standing Exercises  shruggs 4lb 2x10       Shoulder Exercises: ROM/Strengthening   UBE (Upper Arm Bike)  L3.5 36fwd/3back      Shoulder Exercises: Power Warden/ranger Exercises  chest press 10lb 2x10     Other Power Tower Exercises  rows 20lb and lats 20lb 2x10       Manual Therapy   Manual Therapy  Passive ROM    Passive ROM  L shoulder all directions  PT Short Term Goals - 01/13/18 1108      PT SHORT TERM GOAL #1   Title  independent with initial HEP    Time  2    Period  Weeks    Status  On-going        PT Long Term Goals - 01/13/18 1111      PT LONG TERM GOAL #1   Title  increase left shoulder IR to bra    Time  8    Period  Weeks    Status  On-going      PT LONG TERM GOAL #2   Title  increase flexion of the left shoulder to 130 degrees    Time  8    Period  Weeks    Status  On-going      PT LONG TERM GOAL #3   Title  report no difficulty putting on deodorant    Time  8    Period  Weeks    Status  On-going      PT LONG TERM GOAL #4   Title  report no difficulty getting racks out of warmer at work    Time  8    Period  Weeks    Status  On-going            Plan - 02/11/18 1513    Clinical Impression Statement  Pt L shoulder is doing well with full PROM. she continues to have difficulty with elbow flexion while her hand is supinated. Pt continues to have little bicep activation.     Rehab Potential  Fair    PT Frequency  2x / week     PT Duration  8 weeks    PT Treatment/Interventions  ADLs/Self Care Home Management;Electrical Stimulation;Iontophoresis 4mg /ml Dexamethasone;Moist Heat;Therapeutic exercise;Therapeutic activities;Patient/family education;Manual techniques    PT Next Visit Plan  Scapular stabilization, strengthen RC and incoporate forearm supination, Reports that she has a MD appointment next Thursday       Patient will benefit from skilled therapeutic intervention in order to improve the following deficits and impairments:  Decreased strength, Postural dysfunction, Improper body mechanics, Impaired UE functional use, Increased muscle spasms, Decreased range of motion  Visit Diagnosis: Stiffness of left shoulder, not elsewhere classified  Muscle weakness (generalized)  Acute pain of left shoulder     Problem List Patient Active Problem List   Diagnosis Date Noted  . Leg cramps 12/02/2017  . Weakness of shoulder 12/02/2017  . Hx of colonic polyp 03/12/2017  . Type 2 diabetes mellitus ([place]) 11/06/2016  . GERD (gastroesophageal reflux disease) 05/06/2016  . Advanced directives, counseling/discussion 09/18/2015  . Overweight (BMI 25.0-29.9) 04/25/2014  . Allergic rhinitis 01/27/2012  . Preventative health care 01/27/2012  . Hyperlipidemia 10/19/2006  . Essential hypertension 10/19/2006  . Osteoarthritis 10/19/2006    Scot Jun 02/11/2018, 3:15 PM  [place] [address] [place], [place], [address] Phone: [phone]   Fax:  [phone]  Name: Cassandra Park MRN: [id number] Date of Birth: 05/20/1961

## 2018-02-16 ENCOUNTER — Ambulatory Visit: Payer: Self-pay | Admitting: Physical Therapy

## 2018-03-02 ENCOUNTER — Encounter: Payer: Self-pay | Admitting: Internal Medicine

## 2018-03-02 ENCOUNTER — Other Ambulatory Visit: Payer: Self-pay

## 2018-03-02 ENCOUNTER — Ambulatory Visit (INDEPENDENT_AMBULATORY_CARE_PROVIDER_SITE_OTHER): Payer: Self-pay | Admitting: Internal Medicine

## 2018-03-02 ENCOUNTER — Encounter (INDEPENDENT_AMBULATORY_CARE_PROVIDER_SITE_OTHER): Payer: Self-pay

## 2018-03-02 VITALS — BP 133/70 | HR 89 | Temp 97.7°F | Ht 69.0 in | Wt 191.4 lb

## 2018-03-02 DIAGNOSIS — Z7984 Long term (current) use of oral hypoglycemic drugs: Secondary | ICD-10-CM

## 2018-03-02 DIAGNOSIS — M25512 Pain in left shoulder: Secondary | ICD-10-CM

## 2018-03-02 DIAGNOSIS — R29898 Other symptoms and signs involving the musculoskeletal system: Secondary | ICD-10-CM

## 2018-03-02 DIAGNOSIS — I10 Essential (primary) hypertension: Secondary | ICD-10-CM

## 2018-03-02 DIAGNOSIS — E119 Type 2 diabetes mellitus without complications: Secondary | ICD-10-CM

## 2018-03-02 DIAGNOSIS — Z79899 Other long term (current) drug therapy: Secondary | ICD-10-CM

## 2018-03-02 DIAGNOSIS — K219 Gastro-esophageal reflux disease without esophagitis: Secondary | ICD-10-CM

## 2018-03-02 DIAGNOSIS — E785 Hyperlipidemia, unspecified: Secondary | ICD-10-CM

## 2018-03-02 LAB — POCT GLYCOSYLATED HEMOGLOBIN (HGB A1C): HEMOGLOBIN A1C: 5.7 % — AB (ref 4.0–5.6)

## 2018-03-02 LAB — GLUCOSE, CAPILLARY: Glucose-Capillary: 120 mg/dL — ABNORMAL HIGH (ref 65–99)

## 2018-03-02 MED ORDER — METFORMIN HCL 500 MG PO TABS: 500.0000 mg | ORAL_TABLET | Freq: Two times a day (BID) | ORAL | 3 refills | Status: DC

## 2018-03-02 MED ORDER — CARVEDILOL 3.125 MG PO TABS: 3.1250 mg | ORAL_TABLET | Freq: Two times a day (BID) | ORAL | 3 refills | Status: DC

## 2018-03-02 MED ORDER — AMLODIPINE BESYLATE 10 MG PO TABS: 10.0000 mg | ORAL_TABLET | Freq: Every day | ORAL | 3 refills | Status: DC

## 2018-03-02 MED ORDER — LOVASTATIN 20 MG PO TABS: 20.0000 mg | ORAL_TABLET | Freq: Every day | ORAL | 3 refills | Status: DC

## 2018-03-02 MED ORDER — PANTOPRAZOLE SODIUM 40 MG PO TBEC: 40.0000 mg | DELAYED_RELEASE_TABLET | Freq: Every day | ORAL | 1 refills | Status: DC

## 2018-03-02 NOTE — Patient Instructions (Addendum)
Good to see you again Cassandra Park.  Great job working with physical therapy and improving your shoulder.  Continue to do exercises they taught you at home and you should continue to gradually improve.  If you have return of any pain or weakness, do not hesitate to make another appointment and we can get you back with physical therapy if needed at that time.  We are going to check another A1c to see how your diabetes has been doing.  Continue doing a great job of eating well with vegetables, white meat, and avoiding foods that are high in sugar and fat. I will call if anything needs to be changed based on the A1c.   We also refilled your medicines with some going to the health department and some going to [facility] and change them to be 33-month supplies.  We will see you back in 6 months unless anything comes up and you need to be seen sooner.
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
## 2018-03-03 NOTE — Assessment & Plan Note (Addendum)
Her diabetes has been well controlled on Metformin 500 mg BID and she reports positive dietary habits. Repeat A1c today 5.7. Can continue to check A1c at 6 month intervals given consistent control. Continue metformin

## 2018-03-03 NOTE — Assessment & Plan Note (Signed)
She reports improvement in her shoulder strength with no recurrent pain following outpatient physical therapy. She continues to use exercises taught by PT and has only minimal weakness with elbow flexion and no limitation in function at home. Feel continued PT would be of minimal benefit, but encouraged pt to continue home exercises for further gradual improvement.

## 2018-03-03 NOTE — Assessment & Plan Note (Signed)
Remains at goal on current regimen, refills provided. Continue without changes

## 2018-03-04 NOTE — Progress Notes (Signed)
Internal Medicine Clinic Attending  Case discussed with Dr. Harden at the time of the visit.  We reviewed the resident's history and exam and pertinent patient test results.  I agree with the assessment, diagnosis, and plan of care documented in the resident's note.  

## 2018-04-05 ENCOUNTER — Encounter: Payer: Self-pay | Admitting: *Deleted

## 2018-04-27 ENCOUNTER — Encounter: Payer: Self-pay | Admitting: Internal Medicine

## 2018-04-27 ENCOUNTER — Ambulatory Visit (INDEPENDENT_AMBULATORY_CARE_PROVIDER_SITE_OTHER): Payer: Self-pay | Admitting: Internal Medicine

## 2018-04-27 ENCOUNTER — Encounter (INDEPENDENT_AMBULATORY_CARE_PROVIDER_SITE_OTHER): Payer: Self-pay

## 2018-04-27 VITALS — BP 136/72 | HR 81 | Temp 98.5°F | Wt 192.2 lb

## 2018-04-27 DIAGNOSIS — K219 Gastro-esophageal reflux disease without esophagitis: Secondary | ICD-10-CM

## 2018-04-27 DIAGNOSIS — E1136 Type 2 diabetes mellitus with diabetic cataract: Secondary | ICD-10-CM

## 2018-04-27 DIAGNOSIS — E119 Type 2 diabetes mellitus without complications: Secondary | ICD-10-CM

## 2018-04-27 DIAGNOSIS — Z7984 Long term (current) use of oral hypoglycemic drugs: Secondary | ICD-10-CM

## 2018-04-27 DIAGNOSIS — Z79899 Other long term (current) drug therapy: Secondary | ICD-10-CM

## 2018-04-27 DIAGNOSIS — H18609 Keratoconus, unspecified, unspecified eye: Secondary | ICD-10-CM

## 2018-04-27 DIAGNOSIS — E785 Hyperlipidemia, unspecified: Secondary | ICD-10-CM

## 2018-04-27 DIAGNOSIS — I10 Essential (primary) hypertension: Secondary | ICD-10-CM

## 2018-04-27 DIAGNOSIS — Z0000 Encounter for general adult medical examination without abnormal findings: Secondary | ICD-10-CM

## 2018-04-27 MED ORDER — TRIAMTERENE-HCTZ 37.5-25 MG PO TABS: 1.0000 | ORAL_TABLET | Freq: Every day | ORAL | 3 refills | Status: DC

## 2018-04-27 MED ORDER — AMLODIPINE BESYLATE 10 MG PO TABS: 10.0000 mg | ORAL_TABLET | Freq: Every day | ORAL | 3 refills | Status: DC

## 2018-04-27 MED ORDER — LOVASTATIN 20 MG PO TABS: 20.0000 mg | ORAL_TABLET | Freq: Every day | ORAL | 3 refills | Status: DC

## 2018-04-27 MED ORDER — CARVEDILOL 3.125 MG PO TABS: 3.1250 mg | ORAL_TABLET | Freq: Two times a day (BID) | ORAL | 3 refills | Status: DC

## 2018-04-27 MED ORDER — PANTOPRAZOLE SODIUM 40 MG PO TBEC: 40.0000 mg | DELAYED_RELEASE_TABLET | Freq: Every day | ORAL | 1 refills | Status: DC

## 2018-04-27 MED ORDER — METFORMIN HCL 500 MG PO TABS: 500.0000 mg | ORAL_TABLET | Freq: Two times a day (BID) | ORAL | 3 refills | Status: DC

## 2018-04-27 NOTE — Assessment & Plan Note (Addendum)
Cassandra Park has history of a1c >6.5 in 2018, but last 2 a1c all within pre-diabetes range  - Discussed importance of keeping diabetic diet - Will discuss discontinuing metformin if patient's next a1c is normal in 6 months

## 2018-04-27 NOTE — Assessment & Plan Note (Addendum)
BP Readings from Last 3 Encounters:  04/27/18 136/72  03/02/18 133/70  12/02/17 120/63   Blood pressure above goal on new guidelines. Denies any significant side effects on her current regimen  - C/w triamterene-HCTZ 37.5-25, Amlodipine 10, Coreg 3.125 BID - Can consider increasing coreg dose if blood pressure goes above 140 as her pulse is also 88 - Recommend DASH diet to reduce systolic bp below 076

## 2018-04-27 NOTE — Assessment & Plan Note (Signed)
Cassandra Park continues to endorse occasional heart burn but states her PPI appear to be helping  C/w pantoprazole 40mg 

## 2018-04-27 NOTE — Progress Notes (Signed)
CC: Hypertension management  HPI: [name] is a 57 y.o. F w/ PMH of GERD, HLD, HTN here for management of her chronic conditions. She has been doing well on her medications without significant side effects but she needed to come in because she knew she would need refills soon. She has not noticed any headaches, chest pain, palpitations. She is continuing to endorse occasional heartburn but taking her PPI regularly helps. She also is taking her metformin regularly as prescribed without diarrhea. She states she has been trying to stick to a DASH diet as much as she can but describes difficulty with grocery shopping sometimes due to her visual deficits with known Keratoconus and cataracts which she follows with [facility] eye center.  Past Medical History:  Diagnosis Date  . Allergic rhinitis   . Allergy   . Diabetes mellitus without complication ([place])   . GERD (gastroesophageal reflux disease)   . Heart murmur   . 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 2 pap smears since then normal. [facility]  . HTN (hypertension)   . Hx of colonic polyp 03/12/2017   7 mm hyperplastic transverse recall 2023  . Hyperlipidemia   . Keratoconus    f/u @ [facility] eye center. Not needing corneal transplant.   . Osteoarthritis    Left hip  . Substance abuse ([place])    tobacco, alcohol  . Vaginal bleeding, abnormal June 2010   Thought to be 2/2 DUB vs endometrial CA. Gyn referral made at that time.   Review of Systems: Review of Systems  Constitutional: Negative for chills, fever and malaise/fatigue.  Cardiovascular: Negative for chest pain, palpitations and orthopnea.  Neurological: Negative for dizziness, tingling, sensory change, weakness and headaches.    Physical Exam: Vitals:   04/27/18 1552 04/27/18 1643  BP: (!) 142/75 136/72  Pulse: 81   Temp: 98.5 F (36.9 C)   SpO2: 98%    Physical Exam  Constitutional: She is oriented to person,  place, and time. She appears well-developed and well-nourished. No distress.  Eyes: Pupils are equal, round, and reactive to light. EOM are normal. No scleral icterus.  Cataract on left eye  Cardiovascular: Normal rate, regular rhythm, normal heart sounds and intact distal pulses.  Respiratory: Effort normal and breath sounds normal. No respiratory distress. She has no wheezes. She has no rales.  Neurological: She is alert and oriented to person, place, and time. She has normal reflexes. No cranial nerve deficit.      Assessment & Plan:   Healthcare maintenance Mrs.Cassandra Park has no record of being screened for HepC and HIV  - Will do blood work for antibodies today  Hyperlipidemia Lipid Panel     Component Value Date/Time   CHOL 172 10/12/2017 0933   TRIG 84 10/12/2017 0933   HDL 75 10/12/2017 0933   CHOLHDL 2.3 10/12/2017 0933   CHOLHDL 2.3 02/27/2015 1433   VLDL 39 02/27/2015 1433   LDLCALC 80 10/12/2017 0933   Last lipid panel show cholesterol and ldl at goal. C/w lovastatin 20mg   Type 2 diabetes mellitus ([place]) Mrs.Cassandra Park has history of a1c >6.5 in 2018, but last 2 a1c all within pre-diabetes range  - Discussed importance of keeping diabetic diet - Will discuss discontinuing metformin if patient's next a1c is normal in 6 months  GERD (gastroesophageal reflux disease) Mrs.Cassandra Park continues to endorse occasional heart burn but states her PPI appear to be helping  C/w pantoprazole 40mg   Essential hypertension BP Readings from Last 3 Encounters:  04/27/18 136/72  03/02/18 133/70  12/02/17 120/63   Blood pressure above goal on new guidelines. Denies any significant side effects on her current regimen  - C/w triamterene-HCTZ 37.5-25, Amlodipine 10, Coreg 3.125 BID - Can consider increasing coreg dose if blood pressure goes above 140 as her pulse is also 88 - Recommend DASH diet to reduce systolic bp below 449   Patient seen with Dr. Dareen Piano   -Gilberto Better,  PGY1

## 2018-04-27 NOTE — Assessment & Plan Note (Signed)
Lipid Panel     Component Value Date/Time   CHOL 172 10/12/2017 0933   TRIG 84 10/12/2017 0933   HDL 75 10/12/2017 0933   CHOLHDL 2.3 10/12/2017 0933   CHOLHDL 2.3 02/27/2015 1433   VLDL 39 02/27/2015 1433   LDLCALC 80 10/12/2017 0933   Last lipid panel show cholesterol and ldl at goal. C/w lovastatin 20mg 

## 2018-04-27 NOTE — Assessment & Plan Note (Signed)
Cassandra Park has no record of being screened for HepC and HIV  - Will do blood work for antibodies today

## 2018-04-27 NOTE — Patient Instructions (Signed)
Cassandra Park  Please thank you for coming in. We are glad to see that you are doing well. Please continue to take all of your medication as prescribed and we will see you back in 6 months.    DASH Eating Plan DASH stands for "Dietary Approaches to Stop Hypertension." The DASH eating plan is a healthy eating plan that has been shown to reduce high blood pressure (hypertension). It may also reduce your risk for type 2 diabetes, heart disease, and stroke. The DASH eating plan may also help with weight loss. What are tips for following this plan? General guidelines  Avoid eating more than 2,300 mg (milligrams) of salt (sodium) a day. If you have hypertension, you may need to reduce your sodium intake to 1,500 mg a day.  Limit alcohol intake to no more than 1 drink a day for nonpregnant women and 2 drinks a day for men. One drink equals 12 oz of beer, 5 oz of wine, or 1 oz of hard liquor.  Work with your health care provider to maintain a healthy body weight or to lose weight. Ask what an ideal weight is for you.  Get at least 30 minutes of exercise that causes your heart to beat faster (aerobic exercise) most days of the week. Activities may include walking, swimming, or biking.  Work with your health care provider or diet and nutrition specialist (dietitian) to adjust your eating plan to your individual calorie needs. Reading food labels  Check food labels for the amount of sodium per serving. Choose foods with less than 5 percent of the Daily Value of sodium. Generally, foods with less than 300 mg of sodium per serving fit into this eating plan.  To find whole grains, look for the word "whole" as the first word in the ingredient list. Shopping  Buy products labeled as "low-sodium" or "no salt added."  Buy fresh foods. Avoid canned foods and premade or frozen meals. Cooking  Avoid adding salt when cooking. Use salt-free seasonings or herbs instead of table salt or sea salt. Check with  your health care provider or pharmacist before using salt substitutes.  Do not fry foods. Cook foods using healthy methods such as baking, boiling, grilling, and broiling instead.  Cook with heart-healthy oils, such as olive, canola, soybean, or sunflower oil. Meal planning   Eat a balanced diet that includes: ? 5 or more servings of fruits and vegetables each day. At each meal, try to fill half of your plate with fruits and vegetables. ? Up to 6-8 servings of whole grains each day. ? Less than 6 oz of lean meat, poultry, or fish each day. A 3-oz serving of meat is about the same size as a deck of cards. One egg equals 1 oz. ? 2 servings of low-fat dairy each day. ? A serving of nuts, seeds, or beans 5 times each week. ? Heart-healthy fats. Healthy fats called Omega-3 fatty acids are found in foods such as flaxseeds and coldwater fish, like sardines, salmon, and mackerel.  Limit how much you eat of the following: ? Canned or prepackaged foods. ? Food that is high in trans fat, such as fried foods. ? Food that is high in saturated fat, such as fatty meat. ? Sweets, desserts, sugary drinks, and other foods with added sugar. ? Full-fat dairy products.  Do not salt foods before eating.  Try to eat at least 2 vegetarian meals each week.  Eat more home-cooked food and less restaurant, buffet, and fast  food.  When eating at a restaurant, ask that your food be prepared with less salt or no salt, if possible. What foods are recommended? The items listed may not be a complete list. Talk with your dietitian about what dietary choices are best for you. Grains Whole-grain or whole-wheat bread. Whole-grain or whole-wheat pasta. Brown rice. [place]. Bulgur. Whole-grain and low-sodium cereals. Pita bread. Low-fat, low-sodium crackers. Whole-wheat flour tortillas. Vegetables Fresh or frozen vegetables (raw, steamed, roasted, or grilled). Low-sodium or reduced-sodium tomato and vegetable  juice. Low-sodium or reduced-sodium tomato sauce and tomato paste. Low-sodium or reduced-sodium canned vegetables. Fruits All fresh, dried, or frozen fruit. Canned fruit in natural juice (without added sugar). Meat and other protein foods Skinless chicken or [place]. Ground chicken or [place]. Pork with fat trimmed off. Fish and seafood. Egg whites. Dried beans, peas, or lentils. Unsalted nuts, nut butters, and seeds. Unsalted canned beans. Lean cuts of beef with fat trimmed off. Low-sodium, lean deli meat. Dairy Low-fat (1%) or fat-free (skim) milk. Fat-free, low-fat, or reduced-fat cheeses. Nonfat, low-sodium ricotta or cottage cheese. Low-fat or nonfat yogurt. Low-fat, low-sodium cheese. Fats and oils Soft margarine without trans fats. Vegetable oil. Low-fat, reduced-fat, or light mayonnaise and salad dressings (reduced-sodium). Canola, safflower, olive, soybean, and sunflower oils. Avocado. Seasoning and other foods Herbs. Spices. Seasoning mixes without salt. Unsalted popcorn and pretzels. Fat-free sweets. What foods are not recommended? The items listed may not be a complete list. Talk with your dietitian about what dietary choices are best for you. Grains Baked goods made with fat, such as croissants, muffins, or some breads. Dry pasta or rice meal packs. Vegetables Creamed or fried vegetables. Vegetables in a cheese sauce. Regular canned vegetables (not low-sodium or reduced-sodium). Regular canned tomato sauce and paste (not low-sodium or reduced-sodium). Regular tomato and vegetable juice (not low-sodium or reduced-sodium). Angie Fava. Olives. Fruits Canned fruit in a light or heavy syrup. Fried fruit. Fruit in cream or butter sauce. Meat and other protein foods Fatty cuts of meat. Ribs. Fried meat. Berniece Salines. Sausage. Bologna and other processed lunch meats. Salami. Fatback. Hotdogs. Bratwurst. Salted nuts and seeds. Canned beans with added salt. Canned or smoked fish. Whole eggs or egg yolks.  Chicken or [place] with skin. Dairy Whole or 2% milk, cream, and half-and-half. Whole or full-fat cream cheese. Whole-fat or sweetened yogurt. Full-fat cheese. Nondairy creamers. Whipped toppings. Processed cheese and cheese spreads. Fats and oils Butter. Stick margarine. Lard. Shortening. Ghee. Bacon fat. Tropical oils, such as coconut, palm kernel, or palm oil. Seasoning and other foods Salted popcorn and pretzels. Onion salt, garlic salt, seasoned salt, table salt, and sea salt. Worcestershire sauce. Tartar sauce. Barbecue sauce. Teriyaki sauce. Soy sauce, including reduced-sodium. Steak sauce. Canned and packaged gravies. Fish sauce. Oyster sauce. Cocktail sauce. Horseradish that you find on the shelf. Ketchup. Mustard. Meat flavorings and tenderizers. Bouillon cubes. Hot sauce and Tabasco sauce. Premade or packaged marinades. Premade or packaged taco seasonings. Relishes. Regular salad dressings. Where to find more information:  National Heart, Lung, and [place]: https://wilson-eaton.com/  American Heart Association: www.heart.org Summary  The DASH eating plan is a healthy eating plan that has been shown to reduce high blood pressure (hypertension). It may also reduce your risk for type 2 diabetes, heart disease, and stroke.  With the DASH eating plan, you should limit salt (sodium) intake to 2,300 mg a day. If you have hypertension, you may need to reduce your sodium intake to 1,500 mg a day.  When on the DASH  eating plan, aim to eat more fresh fruits and vegetables, whole grains, lean proteins, low-fat dairy, and heart-healthy fats.  Work with your health care provider or diet and nutrition specialist (dietitian) to adjust your eating plan to your individual calorie needs. This information is not intended to replace advice given to you by your health care provider. Make sure you discuss any questions you have with your health care provider. Document Released: 08/21/2011 Document Revised:  08/25/2016 Document Reviewed: 08/25/2016 Elsevier Interactive Patient Education  [employer].

## 2018-04-28 LAB — HEPATITIS C ANTIBODY

## 2018-04-28 LAB — HIV ANTIBODY (ROUTINE TESTING W REFLEX): HIV Screen 4th Generation wRfx: NONREACTIVE

## 2018-04-29 ENCOUNTER — Telehealth: Payer: Self-pay | Admitting: Internal Medicine

## 2018-04-29 NOTE — Progress Notes (Signed)
Internal Medicine Clinic Attending  I saw and evaluated the patient.  I personally confirmed the key portions of the history and exam documented by Dr. Lee and I reviewed pertinent patient test results.  The assessment, diagnosis, and plan were formulated together and I agree with the documentation in the resident's note.  

## 2018-04-29 NOTE — Telephone Encounter (Signed)
Spoke with patient at home number and informed her about her negative Hep C and HIV results. Patient expressed understanding.

## 2018-05-03 ENCOUNTER — Other Ambulatory Visit: Payer: Self-pay | Admitting: Obstetrics and Gynecology

## 2018-05-03 DIAGNOSIS — Z1231 Encounter for screening mammogram for malignant neoplasm of breast: Secondary | ICD-10-CM

## 2018-05-27 ENCOUNTER — Telehealth: Payer: Self-pay | Admitting: Internal Medicine

## 2018-06-24 ENCOUNTER — Other Ambulatory Visit: Payer: Self-pay

## 2018-06-24 NOTE — Telephone Encounter (Signed)
amLODipine (NORVASC) 10 MG tablet  pantoprazole (PROTONIX) 40 MG tablet  triamterene-hydrochlorothiazide (MAXZIDE-25) 37.5-25 MG tablet, refill request @  [place], [place] [phone] (Phone) [phone] (Fax

## 2018-06-25 NOTE — Telephone Encounter (Signed)
Called [facility] and pt has refills available and all have been refilled and are waiting in the ready box for pickup

## 2018-06-29 ENCOUNTER — Ambulatory Visit (HOSPITAL_COMMUNITY): Payer: Self-pay

## 2018-06-29 ENCOUNTER — Ambulatory Visit (HOSPITAL_COMMUNITY)
Admission: RE | Admit: 2018-06-29 | Discharge: 2018-06-29 | Disposition: A | Discharge status: Home / Self Care | Payer: Self-pay | Source: Ambulatory Visit | Attending: Obstetrics and Gynecology | Admitting: Obstetrics and Gynecology

## 2018-06-29 ENCOUNTER — Ambulatory Visit
Admission: RE | Admit: 2018-06-29 | Discharge: 2018-06-29 | Disposition: A | Discharge status: Home / Self Care | Payer: Self-pay | Source: Ambulatory Visit | Attending: Obstetrics and Gynecology | Admitting: Obstetrics and Gynecology

## 2018-06-29 ENCOUNTER — Encounter (HOSPITAL_COMMUNITY): Payer: Self-pay

## 2018-06-29 VITALS — BP 120/72 | Wt 191.0 lb

## 2018-06-29 DIAGNOSIS — Z1231 Encounter for screening mammogram for malignant neoplasm of breast: Secondary | ICD-10-CM

## 2018-06-29 DIAGNOSIS — Z1239 Encounter for other screening for malignant neoplasm of breast: Secondary | ICD-10-CM

## 2018-06-29 NOTE — Progress Notes (Signed)
No complaints today.   Pap Smear: Pap smear not completed today. Last Pap smear was 02/23/2017 at the free cervical cancer screening at [facility] and normal. Per patient has a history of an abnormal Pap smear when she was around 57 years old that cryotherapy was completed for follow up. Per patient all Pap smears have been normal since cryotherapy. Last Pap smear result is in EPIC.  Physical exam: Breasts Breasts symmetrical. No skin abnormalities bilateral breasts. No nipple retraction bilateral breasts. No nipple discharge bilateral breasts. No lymphadenopathy. No lumps palpated bilateral breasts. No complaints of pain or tenderness on exam. Referred patient to the [place] for a screening mammogram. Appointment scheduled for Tuesday, June 29, 2018 at 1240.        Pelvic/Bimanual No Pap smear completed today since last Pap smear was 02/23/2017. Pap smear not indicated per BCCCP guidelines.   Smoking History: Patient is a former smoker that quit 03/29/2013.  Patient Navigation: Patient education provided. Access to services provided for patient through [place] program.   Colorectal Cancer Screening: Patient had a colonoscopy completed 03/06/2017. No complaints today. FIT Test given to patient to complete and return to BCCCP.  Breast and Cervical Cancer Risk Assessment: Patient has a family history of a maternal aunt and three cousins having breast cancer. Patient has no known genetic mutations or history of radiation treatment to the chest before age 17. Per patient has a history of cervical dysplasia. Patient has no history of being immunocompromised or DES exposure in-utero.  Risk Assessment    Risk Scores      06/29/2018   Last edited by: Armond Hang, LPN   5-year risk: 1.1 %   Lifetime risk: 5.5 %

## 2018-06-29 NOTE — Patient Instructions (Addendum)
Explained breast self awareness with [name]. Patient did not need a Pap smear today due to last Pap smear was 02/23/2017. Let her know BCCCP will cover Pap smears every 3 years unless has a history of abnormal Pap smears. Referred patient to the [facility] for a screening mammogram. Appointment scheduled for Tuesday, June 29, 2018 at 1240. Patient aware of appointment and will be there. Let patient know the [facility] will follow up with her within the next couple weeks with results of mammogram by letter or phone. Cassandra Park verbalized understanding.  Nysha Koplin, [name], RN 10:15 AM

## 2018-07-12 ENCOUNTER — Other Ambulatory Visit: Payer: Self-pay

## 2018-07-12 ENCOUNTER — Ambulatory Visit (INDEPENDENT_AMBULATORY_CARE_PROVIDER_SITE_OTHER): Payer: Self-pay | Admitting: Internal Medicine

## 2018-07-12 ENCOUNTER — Encounter: Payer: Self-pay | Admitting: Internal Medicine

## 2018-07-12 VITALS — BP 140/74 | HR 79 | Temp 98.0°F | Ht 69.0 in | Wt 193.4 lb

## 2018-07-12 DIAGNOSIS — M79605 Pain in left leg: Secondary | ICD-10-CM | POA: Insufficient documentation

## 2018-07-12 LAB — POCT URINALYSIS DIPSTICK
Blood, UA: NEGATIVE
GLUCOSE UA: NEGATIVE
KETONES UA: NEGATIVE
PH UA: 6 (ref 5.0–8.0)
PROTEIN UA: POSITIVE — AB
UROBILINOGEN UA: 1 U/dL

## 2018-07-12 NOTE — Patient Instructions (Signed)
FOLLOW-UP INSTRUCTIONS When: With your primary care doctor at his next available appointment What to bring: All of your medications   I have made not made any changes to your medications today.   Today we discussed your left leg/thigh pain.   As always if your symptoms worsen, fail to improve, or you develop other concerning symptoms, please notify our office or visit the local ER if we are unavailable. Symptoms including blood in your urine, fever, generalized or focal weakness, should not be ignored and should encourage you to visit the ED if we are unavailable by phone or the symptoms are severe.  Thank you for your visit to the [facility] today. If you have any questions or concerns please call us at [phone].

## 2018-07-12 NOTE — Progress Notes (Signed)
Internal Medicine Clinic Attending  Case discussed with Dr. Harbrecht at the time of the visit.  We reviewed the resident's history and exam and pertinent patient test results.  I agree with the assessment, diagnosis, and plan of care documented in the resident's note.   

## 2018-07-12 NOTE — Progress Notes (Signed)
CC: leg pain and medication refill  HPI:[name] is a 57 y.o. female who presents for evaluation of leg pain. Please see individual problem based A/P for details.  Left leg pain: Two months of left leg/groin/hip pain, progressively improving, formerly daily pain but now intermittent, not made worse with the act of standing but worse with standing if the pain occurs while she is standing, not worse with walking, radiates into her left hip from her distal leg and into her groin. This is more notable while in bed or lying prone at any point. Relieved completely with micturition which she feels is necessary after the pain radiates into her groin area.  Denied abdominal pain, diarrhea, constipation, dysuria, fevers, chills, nausea, vomiting, headache, hematuria, hematochezia, joint pain, locking or clicking joints, or other concerning symptoms. Denied Hx of renal stones. Differential includes but not diagnostic of the following: atypical urinary tract spasm, renal caliculi, pelvic mass, or atypical musculoskeletal pain.  No recent abdominal imaging. UA dipstick unremarkable as absent hemoglobin or nitrites. Positive for nonspecific leukocytes and low protein. Most recent BMP in 11/2017 also unremarkable with intact renal function Scr of 0.66.  Plan: Uncertain etiology. No clear pathophysiological process identified on history of physical exam. She prefers to avoid an expensive evaluation and as such I have recommended that we monitor this process as it is improving and lacks features typically related with emergent need for investigation.  If [facility] card expiries and patient is unable to obtain medications through [facility] in time, please consider one month on the IM program at the [place] as she is otherwise uninsured.   PHQ-9: Based on the patients    Office Visit from 07/12/2018 in [place]  PHQ-9 Total Score  0     score we have decided to  monitor.  Past Medical History:  Diagnosis Date  . Allergic rhinitis   . Allergy   . Diabetes mellitus without complication ([place])   . GERD (gastroesophageal reflux disease)   . Heart murmur   . 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 2 pap smears since then normal. [facility]/07 [facility]  . HTN (hypertension)   . Hx of colonic polyp 03/12/2017   7 mm hyperplastic transverse recall 2023  . Hyperlipidemia   . 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   . Osteoarthritis    Left hip  . Substance abuse ([place])    tobacco, alcohol  . Vaginal bleeding, abnormal June 2010   Thought to be 2/2 DUB vs endometrial CA. Gyn referral made at that time.   Review of Systems:  ROS negative except as per HPI.  Physical Exam: Vitals:   07/12/18 1038  BP: 140/74  Pulse: 79  Temp: 98 F (36.7 C)  TempSrc: Oral  SpO2: 98%  Weight: 193 lb 6.4 oz (87.7 kg)  Height: 5\' 9"  (1.753 m)   General: A/O x4, in no acute distress, afebrile, nondiaphoretic, appears comfortable at rest Cardio: RRR no mrg's Pulmonary: CTA bilaterally MSK: unremarkable evaluation of her thoracic and lumbar spine, no pain on palpation, ROM of the left and right hips limited but nonpainful, no inducible SI joint pain, distal lower extremities nontender equal in size, Strength 5/5 upper and lower bilaterally including hips, thighs, and calfs. Sensation intact bilaterally.   Assessment & Plan:   See Encounters Tab for problem based charting.  Patient  discussed with Dr. Lynnae January

## 2018-07-12 NOTE — Assessment & Plan Note (Addendum)
Left leg pain: Two months of left leg/groin/hip pain, progressively improving, formerly daily pain but now intermittent, not made worse with the act of standing but worse with standing if the pain occurs while she is standing, not worse with walking, radiates into her left hip from her distal leg and into her groin. This is more notable while in bed or lying prone at any point. Relieved completely with micturition which she feels is necessary after the pain radiates into her groin area.  Denied abdominal pain, diarrhea, constipation, dysuria, fevers, chills, nausea, vomiting, headache, hematuria, hematochezia, joint pain, locking or clicking joints, or other concerning symptoms. Denied Hx of renal stones. Differential includes but not diagnostic of the following: atypical urinary tract spasm, renal caliculi, pelvic mass, or atypical musculoskeletal pain.  UA dipstick unremarkable as absent hemoglobin or nitrites. Positive for nonspecific leukocytes and low protein. Most recent BMP in 11/2017 also unremarkable with intact renal function Scr of 0.66.  Plan: Uncertain etiology. No clear pathophysiological process identified on history of physical exam. She prefers to avoid an expensive evaluation and as such I have recommended that we monitor this process as it is improving and lacks features typically related with emergent need for investigation.

## 2018-07-14 ENCOUNTER — Encounter (HOSPITAL_COMMUNITY): Payer: Self-pay | Admitting: *Deleted

## 2018-07-22 ENCOUNTER — Other Ambulatory Visit: Payer: Self-pay

## 2018-07-22 DIAGNOSIS — I10 Essential (primary) hypertension: Secondary | ICD-10-CM

## 2018-07-22 DIAGNOSIS — K219 Gastro-esophageal reflux disease without esophagitis: Secondary | ICD-10-CM

## 2018-07-22 MED ORDER — AMLODIPINE BESYLATE 10 MG PO TABS: 10.0000 mg | ORAL_TABLET | Freq: Every day | ORAL | 0 refills | Status: DC

## 2018-07-22 MED ORDER — PANTOPRAZOLE SODIUM 40 MG PO TBEC: 40.0000 mg | DELAYED_RELEASE_TABLET | Freq: Every day | ORAL | 0 refills | Status: DC

## 2018-07-22 NOTE — Telephone Encounter (Signed)
pantoprazole (PROTONIX) 40 MG tablet,  amLODipine (NORVASC) 10 MG tablet, refill request @  [place], [place] [phone] (Phone) [phone] (Fax)   Would like a call back from the nurse.

## 2018-07-26 ENCOUNTER — Ambulatory Visit: Payer: Self-pay

## 2018-08-04 ENCOUNTER — Ambulatory Visit: Payer: PRIVATE HEALTH INSURANCE

## 2018-08-06 ENCOUNTER — Ambulatory Visit: Payer: PRIVATE HEALTH INSURANCE

## 2018-08-11 ENCOUNTER — Encounter (HOSPITAL_COMMUNITY): Payer: Self-pay | Admitting: *Deleted

## 2018-08-31 ENCOUNTER — Other Ambulatory Visit: Payer: Self-pay | Admitting: Internal Medicine

## 2018-08-31 ENCOUNTER — Encounter: Payer: Self-pay | Admitting: Internal Medicine

## 2018-08-31 ENCOUNTER — Ambulatory Visit: Payer: Self-pay | Admitting: Internal Medicine

## 2018-08-31 VITALS — BP 138/70 | HR 93 | Temp 98.2°F | Wt 195.8 lb

## 2018-08-31 DIAGNOSIS — K219 Gastro-esophageal reflux disease without esophagitis: Secondary | ICD-10-CM

## 2018-08-31 DIAGNOSIS — E119 Type 2 diabetes mellitus without complications: Secondary | ICD-10-CM

## 2018-08-31 DIAGNOSIS — Z7984 Long term (current) use of oral hypoglycemic drugs: Secondary | ICD-10-CM

## 2018-08-31 DIAGNOSIS — Z79899 Other long term (current) drug therapy: Secondary | ICD-10-CM

## 2018-08-31 DIAGNOSIS — E1136 Type 2 diabetes mellitus with diabetic cataract: Secondary | ICD-10-CM

## 2018-08-31 DIAGNOSIS — H269 Unspecified cataract: Secondary | ICD-10-CM

## 2018-08-31 DIAGNOSIS — I10 Essential (primary) hypertension: Secondary | ICD-10-CM

## 2018-08-31 DIAGNOSIS — H5462 Unqualified visual loss, left eye, normal vision right eye: Secondary | ICD-10-CM

## 2018-08-31 DIAGNOSIS — E785 Hyperlipidemia, unspecified: Secondary | ICD-10-CM

## 2018-08-31 LAB — POCT GLYCOSYLATED HEMOGLOBIN (HGB A1C): Hemoglobin A1C: 5.8 % — AB (ref 4.0–5.6)

## 2018-08-31 LAB — GLUCOSE, CAPILLARY: Glucose-Capillary: 88 mg/dL (ref 70–99)

## 2018-08-31 MED ORDER — PANTOPRAZOLE SODIUM 40 MG PO TBEC: 40.0000 mg | DELAYED_RELEASE_TABLET | Freq: Every day | ORAL | 1 refills | Status: DC

## 2018-08-31 MED ORDER — CARVEDILOL 6.25 MG PO TABS: 6.2500 mg | ORAL_TABLET | Freq: Two times a day (BID) | ORAL | 1 refills | Status: DC

## 2018-08-31 NOTE — Patient Instructions (Signed)
Mrs.Henriques  Thank you for coming in to the clinic. Here are our recommendations:  Continue taking your medications as prescribed We are increasing your carvedilol from 3.125mg  BID to 6.25mg  BID  It was a pleasure to see you again.

## 2018-09-01 ENCOUNTER — Encounter: Payer: Self-pay | Admitting: Internal Medicine

## 2018-09-01 LAB — MICROALBUMIN / CREATININE URINE RATIO
Creatinine, Urine: 83.1 mg/dL
MICROALB/CREAT RATIO: 21.8 mg/g{creat} (ref 0.0–30.0)
Microalbumin, Urine: 18.1 ug/mL

## 2018-09-01 LAB — BMP8+ANION GAP
ANION GAP: 20 mmol/L — AB (ref 10.0–18.0)
BUN/Creatinine Ratio: 17 (ref 9–23)
BUN: 13 mg/dL (ref 6–24)
CALCIUM: 9.9 mg/dL (ref 8.7–10.2)
CO2: 23 mmol/L (ref 20–29)
Chloride: 98 mmol/L (ref 96–106)
Creatinine, Ser: 0.76 mg/dL (ref 0.57–1.00)
GFR calc Af Amer: 99 mL/min/{1.73_m2} (ref >59)
GFR calc non Af Amer: 86 mL/min/{1.73_m2} (ref >59)
Glucose: 93 mg/dL (ref 65–99)
Potassium: 3.5 mmol/L (ref 3.5–5.2)
Sodium: 141 mmol/L (ref 134–144)

## 2018-09-01 NOTE — Assessment & Plan Note (Signed)
Stable on pantoprazole 40mg . Denies any significant heart burn events  - Refilled pantoprazole as requested

## 2018-09-01 NOTE — Assessment & Plan Note (Addendum)
BP Readings from Last 3 Encounters:  08/31/18 138/70  07/12/18 140/74  06/29/18 120/72   BP borderline for goal. If she was not diabetic, her bp would be above goal but with her diagnosis of T2DM on metformin, her bp is adequate. Currently tolerating her current regimen well, but her discussed the benefits vs risks of stricter blood pressure control with the patient. After much discussion, she agrees to try increasing her Coreg dose to 6.25mg  BID from 3.125mg   - Start Coreg 6.25mg  BID - C/w amlodipine 10mg , Triamterene-HCTAz 37.5-25. - BMP

## 2018-09-01 NOTE — Assessment & Plan Note (Addendum)
Hgb A1c once 6.5 in 2018 but has steadily progressed down to 5.7-5.8 on metformin only. Her last 3 a1cs have all been within pre-diabetes range. She is tolerating metformin well. Will continue current regimen  - Hgb A1c today - Refilled metformin as requested

## 2018-09-01 NOTE — Progress Notes (Signed)
CC: Hypertension  HPI: [name] is a 57 y.o. F w/ PMH of GERD, HTN, HLD, T2DM presenting for management of her chronic conditions. She states she has no acute complaints at this time and would like refills on her medications. She came to the clinic on 10/28 for leg pain but she states the pain has completely resolved with use of Salonpas. We have discussed possibility of stopping her metformin to see if she can control her diabetes with diet only in the past as her hemoglobin a1c has been in pre-diabetic range for last 6 months. She states she likes to keep the things the way it is.  Past Medical History:  Diagnosis Date  . Allergic rhinitis   . Allergy   . Diabetes mellitus without complication ([place])   . GERD (gastroesophageal reflux disease)   . Heart murmur   . 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 2 pap smears since then normal. [facility] in 3/07 [facility],.  . HTN (hypertension)   . Hx of colonic polyp 03/12/2017   7 mm hyperplastic transverse recall 2023  . Hyperlipidemia   . Keratoconus    f/u @ [facility]. Not needing corneal transplant.   . Osteoarthritis    Left hip  . Substance abuse ([place])    tobacco, alcohol  . Vaginal bleeding, abnormal June 2010   Thought to be 2/2 DUB vs endometrial CA. Gyn referral made at that time.   Review of Systems: Review of Systems  Constitutional: Negative for chills, fever and malaise/fatigue.  Respiratory: Negative for shortness of breath.   Cardiovascular: Negative for chest pain, palpitations and leg swelling.  Gastrointestinal: Negative for constipation, diarrhea, nausea and vomiting.  Musculoskeletal: Negative for joint pain.  Neurological: Negative for sensory change.     Physical Exam: Vitals:   08/31/18 1419 08/31/18 1513  BP: (!) 141/72 138/70  Pulse: 93   Temp: 98.2 F (36.8 C)   TempSrc: Oral   SpO2: 99%   Weight: 195 lb 12.8 oz (88.8 kg)    Physical Exam  Constitutional: She  is oriented to person, place, and time. She appears well-developed and well-nourished. No distress.  HENT:  Head: Normocephalic and atraumatic.  Mouth/Throat: Oropharynx is clear and moist. No oropharyngeal exudate.  Eyes: Pupils are equal, round, and reactive to light. Conjunctivae and EOM are normal.  Left eye blind w/ cataract  Neck: Normal range of motion. Neck supple. No JVD present.  Cardiovascular: Normal rate, regular rhythm, normal heart sounds and intact distal pulses.  Respiratory: Effort normal and breath sounds normal.  GI: Soft. Bowel sounds are normal. There is no abdominal tenderness. There is no guarding.  Musculoskeletal: Normal range of motion.        General: No tenderness or edema.  Neurological: She is alert and oriented to person, place, and time. She has normal reflexes.  Sensory exam for bilateral lower extremities normal  Skin: Skin is warm and dry.     Assessment & Plan:   GERD (gastroesophageal reflux disease) Stable on pantoprazole 40mg . Denies any significant heart burn events  - Refilled pantoprazole as requested  Type 2 diabetes mellitus (HCC) Hgb A1c once 6.5 in 2018 but has steadily progressed down to 5.7-5.8 on metformin only. Her last 3 a1cs have all been within pre-diabetes range. She is tolerating metformin well. Will continue current regimen  - Hgb A1c today - Refilled metformin as requested   Essential hypertension  BP Readings from Last 3 Encounters:  08/31/18 138/70  07/12/18 140/74  06/29/18 120/72   BP borderline for goal. If she was not diabetic, her bp would be above goal but with her diagnosis of T2DM on metformin, her bp is adequate. Currently tolerating her current regimen well, but her discussed the benefits vs risks of stricter blood pressure control with the patient. After much discussion, she agrees to try increasing her Coreg dose to 6.25mg  BID from 3.125mg   - Start Coreg 6.25mg  BID - C/w amlodipine 10mg , Triamterene-HCTAz  37.5-25. - BMP      Patient seen with Dr. Angelia Mould   -Gilberto Better, PGY1

## 2018-09-03 NOTE — Progress Notes (Signed)
Internal Medicine Clinic Attending  I saw and evaluated the patient.  I personally confirmed the key portions of the history and exam documented by Dr. Lee and I reviewed pertinent patient test results.  The assessment, diagnosis, and plan were formulated together and I agree with the documentation in the resident's note.  

## 2018-12-01 ENCOUNTER — Telehealth: Payer: Self-pay

## 2018-12-01 NOTE — Telephone Encounter (Signed)
Requesting a letter for work. Please call pt back.  

## 2018-12-02 NOTE — Telephone Encounter (Signed)
Called pt - stated she works for [employer] and her boss stated if they get a letter form her doctor stating she's high risk (for COVID 19) she could stay home and still get paid. Thanks

## 2018-12-03 NOTE — Telephone Encounter (Signed)
Discussed with Cassandra Park in detail about the definition of high risk for COVID 19 including fever, respiratory symptoms AND travel to high risk areas ([place], [place], [place], [place], [place], [place], [place], [place]) or direct contact with patients objectively diagnosed with COVID-19. Cassandra Park expressed understanding and had no further questions.

## 2019-02-15 ENCOUNTER — Other Ambulatory Visit: Payer: Self-pay | Admitting: Internal Medicine

## 2019-02-15 DIAGNOSIS — I10 Essential (primary) hypertension: Secondary | ICD-10-CM

## 2019-02-16 ENCOUNTER — Other Ambulatory Visit: Payer: Self-pay | Admitting: Internal Medicine

## 2019-02-16 DIAGNOSIS — I10 Essential (primary) hypertension: Secondary | ICD-10-CM

## 2019-02-22 ENCOUNTER — Encounter: Payer: Self-pay | Admitting: Internal Medicine

## 2019-02-22 ENCOUNTER — Other Ambulatory Visit: Payer: Self-pay

## 2019-02-22 ENCOUNTER — Ambulatory Visit: Payer: Self-pay | Admitting: Internal Medicine

## 2019-02-22 ENCOUNTER — Ambulatory Visit: Payer: Self-pay

## 2019-02-22 VITALS — BP 116/72 | HR 79 | Temp 97.9°F | Ht 69.0 in | Wt 191.6 lb

## 2019-02-22 DIAGNOSIS — M7632 Iliotibial band syndrome, left leg: Secondary | ICD-10-CM

## 2019-02-22 DIAGNOSIS — K219 Gastro-esophageal reflux disease without esophagitis: Secondary | ICD-10-CM

## 2019-02-22 DIAGNOSIS — I10 Essential (primary) hypertension: Secondary | ICD-10-CM

## 2019-02-22 DIAGNOSIS — H5462 Unqualified visual loss, left eye, normal vision right eye: Secondary | ICD-10-CM

## 2019-02-22 DIAGNOSIS — E785 Hyperlipidemia, unspecified: Secondary | ICD-10-CM

## 2019-02-22 DIAGNOSIS — Z7984 Long term (current) use of oral hypoglycemic drugs: Secondary | ICD-10-CM

## 2019-02-22 DIAGNOSIS — M79605 Pain in left leg: Secondary | ICD-10-CM

## 2019-02-22 DIAGNOSIS — Z79899 Other long term (current) drug therapy: Secondary | ICD-10-CM

## 2019-02-22 DIAGNOSIS — E119 Type 2 diabetes mellitus without complications: Secondary | ICD-10-CM

## 2019-02-22 LAB — GLUCOSE, CAPILLARY: Glucose-Capillary: 92 mg/dL (ref 70–99)

## 2019-02-22 LAB — POCT GLYCOSYLATED HEMOGLOBIN (HGB A1C): Hemoglobin A1C: 6 % — AB (ref 4.0–5.6)

## 2019-02-22 MED ORDER — LOVASTATIN 20 MG PO TABS: 20.0000 mg | ORAL_TABLET | Freq: Every day | ORAL | 3 refills | Status: DC

## 2019-02-22 MED ORDER — METFORMIN HCL 500 MG PO TABS: 500.0000 mg | ORAL_TABLET | Freq: Two times a day (BID) | ORAL | 3 refills | Status: DC

## 2019-02-22 MED ORDER — TRIAMTERENE-HCTZ 37.5-25 MG PO TABS: 1.0000 | ORAL_TABLET | Freq: Every day | ORAL | 3 refills | Status: DC

## 2019-02-22 NOTE — Progress Notes (Signed)
CC: Leg pain  HPI: Cassandra Park is a 58 y.o. F w/ PMH of GERD, T2DM, HLD, HTN, left eye blindness presenting with complaint of left sided leg pain. She was in her usual state of health until 2 months ago when she began to have intermittent 5/10 leg pain starting at her left hip with radiation down to her left later knee. She describes the pain as 'soreness.' Alleviated by ibuprofen and topical lidocaine patch (Salonpas). Denies any obvious inciting event. Denies any trauma, fevers, chills, nausea, vomiting, diarrhea. She mentions pain does not hinder her mobility and is well managed but would like to know if it is due to her arthritis or something else.  Past Medical History:  Diagnosis Date  . Allergic rhinitis   . Allergy   . Diabetes mellitus without complication ([place])   . GERD (gastroesophageal reflux disease)   . Heart murmur   . 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 2 pap smears since then normal. Repeat pap in 3/07 at [facility],.  . HTN (hypertension)   . Hx of colonic polyp 03/12/2017   7 mm hyperplastic transverse recall 2023  . Hyperlipidemia   . 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 Not needing corneal transplant.   . Osteoarthritis    Left hip  . Substance abuse ([place])    tobacco, alcohol  . Vaginal bleeding, abnormal June 2010   Thought to be 2/2 DUB vs endometrial CA. Gyn referral made at that time.    Review of Systems: Review of Systems  Constitutional: Negative for chills, fever and malaise/fatigue.  Eyes: Negative for blurred vision.  Respiratory: Negative for shortness of breath.   Cardiovascular: Negative for chest pain, palpitations and leg swelling.  Gastrointestinal: Negative for constipation, diarrhea, nausea and vomiting.  Musculoskeletal: Positive for joint pain. Negative for falls.  Neurological: Negative for dizziness and headaches.     Physical Exam: Vitals:   02/22/19 1536 02/22/19 1723  BP: 134/63 116/72  Pulse: 79    Temp: 97.9 F (36.6 C)   TempSrc: Oral   SpO2: 100%   Weight: 191 lb 9.6 oz (86.9 kg)   Height: 5\' 9"  (1.753 m)     Physical Exam  Constitutional: She is oriented to person, place, and time. She appears well-developed and well-nourished. No distress.  HENT:  Mouth/Throat: Oropharynx is clear and moist.  Eyes:  Left cloudy cornea  Neck: Normal range of motion. Neck supple.  Cardiovascular: Normal rate, regular rhythm, normal heart sounds and intact distal pulses.  No murmur heard. Respiratory: Effort normal and breath sounds normal. She has no wheezes. She has no rales.  GI: Soft. Bowel sounds are normal. She exhibits no distension. There is no abdominal tenderness.  Musculoskeletal: Normal range of motion.        General: No tenderness (Left hip and knee without tenderness, erythema or edema. No tenderness on active and passive range of motion.  ROM intact.) or edema.  Neurological: She is alert and oriented to person, place, and time.  Skin: Skin is warm and dry.    Assessment & Plan:   Essential hypertension BP Readings from Last 3 Encounters:  02/22/19 116/72  08/31/18 138/70  07/12/18 140/74   BP at goal. Denies any headache, blurry vision, lower extremity edema or chest pain. Improved after starting beta-blocker.  - C/w amlodipine 10mg , triamterene-HCTZ 37.5-25mg , Coreg 6.25mg  BID  Type 2 diabetes mellitus (HCC) Hgb a1c at 6.0  Continues to be well controlled in pre-diabetes range. Denies significant diarrhea w/ metformin. Denies any lower extremity numbness  - C/w metformin 1000mg  BID  It band syndrome, left Describes new pain exacerbated by ambulation on lateral side of left leg. Although she does have hx of arthritis, distribution of pain more consistent with IT band syndrome. Encouraged Rest, Ice, Compression, and Elevation as well as current therapy with topical lidocaine. Described dangers of long-term NSAID use and advised to use only as needed during  exacerbations.  - C/w RICE, ibuprofen as needed   Patient discussed with Dr. Evette Doffing   -Gilberto Better, PGY1

## 2019-02-22 NOTE — Patient Instructions (Signed)
Thank you for allowing [place] to provide your care today. Today we discussed your leg pain    I have ordered hgb a1c labs for you. I will call if any are abnormal.    Today we made no changes to your medications.    Please follow-up in 6 months.    Should you have any questions or concerns please call the internal medicine clinic at [phone].     Iliotibial Band Syndrome  Iliotibial band syndrome (ITBS) is a condition that often causes knee pain. It can also cause pain in the outside of your hip, thigh, and knee. The iliotibial band is a strip of tissue that runs from the outside of your hip and down your thigh to the outside of your knee. Repeatedly bending and straightening your knee can irritate the iliotibial band. What are the causes? This condition is caused by inflammation and irritation from the friction of the iliotibial band moving over the thigh bone (femur) when you repeatedly bend and straighten your knee. What increases the risk? This condition is more likely to develop in people who:  Frequently change elevation during their workouts.  Run very long distances.  Recently increased the length or intensity of their workouts.  Run downhill often, or just started running downhill.  Ride a bike very far or often. You may also be at greater risk if you start a new workout routine without first warming up or if you have a job that requires you to bend, squat, or climb frequently. What are the signs or symptoms? Symptoms of this condition include:  Pain along the outside of your knee that may be worse with activity, especially running or going up and down stairs.  A "snapping" sensation over your knee.  Swelling on the outside of your knee.  Pain or a feeling of tightness in your hip. How is this diagnosed? This condition is diagnosed based on your symptoms, medical history, and physical exam. You may also see a health care provider who specializes in reducing pain and  increasing mobility (physical therapist). A physical therapist may do an exam to check your balance, movement, and way of walking or running (gait) to see whether the way you move could contribute to your injury. You may also have tests to measure your strength, flexibility, and range of motion. How is this treated? Treatment for this condition includes:  Resting and limiting exercise.  Returning to activities gradually.  Doing range-of-motion and strengthening exercises (physical therapy) as told by your health care provider.  Including low-impact activities, such as swimming, in your exercise routine. Follow these instructions at home:  If directed, apply ice to the injured area. ? Put ice in a plastic bag. ? Place a towel between your skin and the bag. ? Leave the ice on for 20 minutes, 2-3 times per day.  Return to your normal activities as told by your health care provider. Ask your health care provider what activities are safe for you.  Keep all follow-up visits with your health care provider. This is important. Contact a health care provider if:  Your pain does not improve or gets worse despite treatment. This information is not intended to replace advice given to you by your health care provider. Make sure you discuss any questions you have with your health care provider. Document Released: 02/21/2002 Document Revised: 10/03/2016 Document Reviewed: 10/03/2016 Elsevier Interactive Patient Education  2019 [employer].

## 2019-02-25 ENCOUNTER — Encounter: Payer: Self-pay | Admitting: Internal Medicine

## 2019-02-25 DIAGNOSIS — M7632 Iliotibial band syndrome, left leg: Secondary | ICD-10-CM | POA: Insufficient documentation

## 2019-02-25 NOTE — Assessment & Plan Note (Signed)
Describes new pain exacerbated by ambulation on lateral side of left leg. Although she does have hx of arthritis, distribution of pain more consistent with IT band syndrome. Encouraged Rest, Ice, Compression, and Elevation as well as current therapy with topical lidocaine. Described dangers of long-term NSAID use and advised to use only as needed during exacerbations.  - C/w RICE, ibuprofen as needed

## 2019-02-25 NOTE — Progress Notes (Signed)
Internal Medicine Clinic Attending ° °Case discussed with Dr. Lee at the time of the visit.  We reviewed the resident’s history and exam and pertinent patient test results.  I agree with the assessment, diagnosis, and plan of care documented in the resident’s note.  °

## 2019-02-25 NOTE — Addendum Note (Signed)
Addended by: Lalla Brothers T on: 02/25/2019 08:46 AM   Modules accepted: Level of Service

## 2019-02-25 NOTE — Assessment & Plan Note (Signed)
Hgb a1c at 6.0 Continues to be well controlled in pre-diabetes range. Denies significant diarrhea w/ metformin. Denies any lower extremity numbness  - C/w metformin 1000mg  BID

## 2019-02-25 NOTE — Assessment & Plan Note (Signed)
BP Readings from Last 3 Encounters:  02/22/19 116/72  08/31/18 138/70  07/12/18 140/74   BP at goal. Denies any headache, blurry vision, lower extremity edema or chest pain. Improved after starting beta-blocker.  - C/w amlodipine 10mg , triamterene-HCTZ 37.5-25mg , Coreg 6.25mg  BID

## 2019-05-11 ENCOUNTER — Ambulatory Visit (INDEPENDENT_AMBULATORY_CARE_PROVIDER_SITE_OTHER): Payer: Self-pay | Admitting: Internal Medicine

## 2019-05-11 ENCOUNTER — Encounter: Payer: Self-pay | Admitting: Internal Medicine

## 2019-05-11 ENCOUNTER — Other Ambulatory Visit: Payer: Self-pay

## 2019-05-11 VITALS — BP 140/75 | HR 85 | Temp 98.3°F | Wt 170.0 lb

## 2019-05-11 DIAGNOSIS — E119 Type 2 diabetes mellitus without complications: Secondary | ICD-10-CM

## 2019-05-11 DIAGNOSIS — M25552 Pain in left hip: Secondary | ICD-10-CM

## 2019-05-11 DIAGNOSIS — H5462 Unqualified visual loss, left eye, normal vision right eye: Secondary | ICD-10-CM

## 2019-05-11 DIAGNOSIS — Z79899 Other long term (current) drug therapy: Secondary | ICD-10-CM

## 2019-05-11 DIAGNOSIS — E785 Hyperlipidemia, unspecified: Secondary | ICD-10-CM

## 2019-05-11 DIAGNOSIS — K219 Gastro-esophageal reflux disease without esophagitis: Secondary | ICD-10-CM

## 2019-05-11 DIAGNOSIS — I10 Essential (primary) hypertension: Secondary | ICD-10-CM

## 2019-05-11 DIAGNOSIS — M170 Bilateral primary osteoarthritis of knee: Secondary | ICD-10-CM

## 2019-05-11 NOTE — Assessment & Plan Note (Signed)
Presents w/ acute onset 3/10 hip pain. Medial tenderness to deep palpation and active ROM limited by pain. Passive ROM intact. On chart review, prior hx of osteoarthritis of knee joints, worse on left. Likely developed osteoarthritis of left hip joint after prolonged compensation for knee pain. Not at 58 yr old so did not get DEXA screen yet. Range of motion intact but will get film to r/o hairline or stress fracture to determine need for further work-up. Cassandra Park is agreeable to participating in physical therapy.  - X-ray left hip - Advised to continue Rest, Ice, NSAIDs PRN as needed - Referral to physical therapy

## 2019-05-11 NOTE — Patient Instructions (Addendum)
Dear [name],  Thank you for allowing [place] to provide your care today. Today we discussed your hip pain. I believe this is likely due to osteoarthritis but I would like you to get X-rays to rule out fracture.  If you do not receive a call, call this number to contact [facility]: [phone]  I have ordered no labs for you. I will call if any are abnormal.    Today we made the no changes to your medications.  Please follow-up in 3 months.    Should you have any questions or concerns please call the internal medicine clinic at [phone].    Thank you for choosing [facility].   Hip Pain  The hip is the joint between the upper legs and the lower pelvis. The bones, cartilage, tendons, and muscles of your hip joint support your body and allow you to move around. Hip pain can range from a minor ache to severe pain in one or both of your hips. The pain may be felt on the inside of the hip joint near the groin, or the outside near the buttocks and upper thigh. You may also have swelling or stiffness. Follow these instructions at home: Managing pain, stiffness, and swelling  If directed, apply ice to the injured area. ? Put ice in a plastic bag. ? Place a towel between your skin and the bag. ? Leave the ice on for 20 minutes, 2-3 times a day  Sleep with a pillow between your legs on your most comfortable side.  Avoid any activities that cause pain. General instructions  Take over-the-counter and prescription medicines only as told by your health care provider.  Do any exercises as told by your health care provider.  Record the following: ? How often you have hip pain. ? The location of your pain. ? What the pain feels like. ? What makes the pain worse.  Keep all follow-up visits as told by your health care provider. This is important. Contact a health care provider if:  You cannot put weight on your leg.  Your pain or swelling continues or gets worse  after one week.  It gets harder to walk.  You have a fever. Get help right away if:  You fall.  You have a sudden increase in pain and swelling in your hip.  Your hip is red or swollen or very tender to touch. Summary  Hip pain can range from a minor ache to severe pain in one or both of your hips.  The pain may be felt on the inside of the hip joint near the groin, or the outside near the buttocks and upper thigh.  Avoid any activities that cause pain.  Record how often you have hip pain, the location of the pain, what makes it worse and what it feels like. This information is not intended to replace advice given to you by your health care provider. Make sure you discuss any questions you have with your health care provider. Document Released: 02/19/2010 Document Revised: 08/14/2017 Document Reviewed: 08/04/2016 Elsevier Patient Education  2020 [employer].

## 2019-05-11 NOTE — Assessment & Plan Note (Signed)
Presents with hx of bilateral knee osteoarthritis, now presenting with left hip pain. Possible exacerbation driven by compensatory mechanism for knee pain. Will repeat X-ray of knee to further evaluate. Last film in 2006.  - X-ray left knee - C/w NSAIDs, Voltaren gel, rest, Ice, physical therapy.

## 2019-05-11 NOTE — Progress Notes (Signed)
CC: Hip pain  HPI: [name] is a 58 y.o. F w/ GERD, T2DM, HLD, HTN, left eye blindness presenting with acute onset left hip pain. She was in her usual state of health until early this morning when she woke up and as she was trying to get out of bed she experienced acute onset 3 out of 10 sore pain on her left hip with radiation down to her groin.  She mentions that she had similar pain with her knee with exacerbation of her arthritis.  She believed the pain was due to arthritis and took ibuprofen and Voltaren gel with some relief.  She mentions that she wanted to be evaluated by a physician to ensure of no serious illness.  She denies any fevers, chills, nausea, vomiting.  She denies any erythema, edema, bowel/urine incontinence, numbness or tingling.  He states she is never had such symptoms in the past at the hip.  She states she if it feels different to the IT band syndrome which she was diagnosed with couple months prior.  Past Medical History:  Diagnosis Date  . Allergic rhinitis   . Allergy   . Diabetes mellitus without complication ([place])   . GERD (gastroesophageal reflux disease)   . Heart murmur   . 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 2 pap smears since then normal. Repeat pap in 3/07 at [facility],.  . HTN (hypertension)   . Hx of colonic polyp 03/12/2017   7 mm hyperplastic transverse recall 2023  . Hyperlipidemia   . Keratoconus    f/u @ [facility]. Not needing corneal transplant.   . Osteoarthritis    Left hip  . Substance abuse ([place])    tobacco, alcohol  . Vaginal bleeding, abnormal June 2010   Thought to be 2/2 DUB vs endometrial CA. Gyn referral made at that time.    Review of Systems: Review of Systems  Constitutional: Negative for chills, fever and malaise/fatigue.  Respiratory: Negative for cough and shortness of breath.   Cardiovascular: Negative for chest pain and leg swelling.  Gastrointestinal: Negative for constipation,  diarrhea, nausea and vomiting.  Musculoskeletal: Positive for joint pain. Negative for back pain and falls.  Neurological: Negative for dizziness, tingling and focal weakness.     Physical Exam: Vitals:   05/11/19 1514  BP: 140/75  Pulse: 85  Temp: 98.3 F (36.8 C)  TempSrc: Oral  SpO2: 99%  Weight: 170 lb (77.1 kg)    Physical Exam  Constitutional: She is oriented to person, place, and time. She appears well-developed and well-nourished.  HENT:  Mouth/Throat: Oropharynx is clear and moist.  Cardiovascular: Normal rate, regular rhythm, normal heart sounds and intact distal pulses.  No murmur heard. Respiratory: Effort normal and breath sounds normal. She has no wheezes. She has no rales.  GI: Soft. Bowel sounds are normal.  Musculoskeletal:        General: Tenderness (No significant asymmetry, swelling, erythema or warmth. Left hip with tenderness to deep palpation on medial side. Active ROM limited by pain. Flexion <25%, Extension <25%, Abduction <15%. Passive ROM intact. Log roll test + for pain. Figure four negative.) present.     Comments: Negative Trendelenburg Sign  Neurological: She is alert and oriented to person, place, and time. She displays normal reflexes.  Skin: Skin is warm and dry.    Assessment & Plan:   Acute pain of left hip Presents w/ acute onset 3/10 hip pain.  Medial tenderness to deep palpation and active ROM limited by pain. Passive ROM intact. On chart review, prior hx of osteoarthritis of knee joints, worse on left. Likely developed osteoarthritis of left hip joint after prolonged compensation for knee pain. Not at 58 yr old so did not get DEXA screen yet. Range of motion intact but will get film to r/o hairline or stress fracture to determine need for further work-up. Ms.Carreon is agreeable to participating in physical therapy.  - X-ray left hip - Advised to continue Rest, Ice, NSAIDs PRN as needed - Referral to physical therapy  Osteoarthritis  Presents with hx of bilateral knee osteoarthritis, now presenting with left hip pain. Possible exacerbation driven by compensatory mechanism for knee pain. Will repeat X-ray of knee to further evaluate. Last film in 2006.  - X-ray left knee - C/w NSAIDs, Voltaren gel, rest, Ice, physical therapy.    Patient discussed with Dr. Daryll Drown   -Gilberto Better, PGY2 [facility] Pager: [phone]

## 2019-05-12 NOTE — Progress Notes (Signed)
Internal Medicine Clinic Attending ° °Case discussed with Dr. Lee at the time of the visit.  We reviewed the resident’s history and exam and pertinent patient test results.  I agree with the assessment, diagnosis, and plan of care documented in the resident’s note.  °

## 2019-05-19 ENCOUNTER — Other Ambulatory Visit: Payer: Self-pay | Admitting: Internal Medicine

## 2019-05-19 DIAGNOSIS — I10 Essential (primary) hypertension: Secondary | ICD-10-CM

## 2019-05-20 ENCOUNTER — Other Ambulatory Visit: Payer: Self-pay

## 2019-05-20 ENCOUNTER — Ambulatory Visit (HOSPITAL_COMMUNITY)
Admission: RE | Admit: 2019-05-20 | Discharge: 2019-05-20 | Disposition: A | Discharge status: Home / Self Care | Payer: PRIVATE HEALTH INSURANCE | Source: Ambulatory Visit | Attending: Internal Medicine | Admitting: Internal Medicine

## 2019-05-20 DIAGNOSIS — M170 Bilateral primary osteoarthritis of knee: Secondary | ICD-10-CM

## 2019-05-20 DIAGNOSIS — M25552 Pain in left hip: Secondary | ICD-10-CM | POA: Insufficient documentation

## 2019-05-21 ENCOUNTER — Telehealth: Payer: Self-pay | Admitting: Internal Medicine

## 2019-05-21 NOTE — Telephone Encounter (Signed)
Discussed with Cassandra Park regarding findings of severe osteoarthritic changes of her left hip. Explained to her different options for treatment including conservative management with physical therapy, ice and occasional NSAID use vs referral to orthopedics for hip replacement. Cassandra Park states that at this time she would prefer to try conservative management first with physical therapy. All other questions and concerns addressed.

## 2019-05-27 ENCOUNTER — Encounter: Payer: Self-pay | Admitting: *Deleted

## 2019-05-31 ENCOUNTER — Telehealth: Payer: Self-pay | Admitting: Internal Medicine

## 2019-05-31 ENCOUNTER — Other Ambulatory Visit (HOSPITAL_COMMUNITY): Payer: Self-pay | Admitting: *Deleted

## 2019-05-31 DIAGNOSIS — Z1231 Encounter for screening mammogram for malignant neoplasm of breast: Secondary | ICD-10-CM

## 2019-05-31 NOTE — Telephone Encounter (Signed)
Pt would like to speak to call her about her referral [phone]

## 2019-06-01 NOTE — Telephone Encounter (Signed)
Spoke with the patient this morning.  She was waiting to schedule an appointment with [facility] until her results were back from her X-ray and she had spoken with Dr. Truman Hayward.  Dr. Truman Hayward has given the pt her results.  Transferred the pt to [facility] for scheduling.  Appt has now been sch for 06/14/2019 with [facility].

## 2019-06-14 ENCOUNTER — Other Ambulatory Visit: Payer: Self-pay

## 2019-06-14 ENCOUNTER — Ambulatory Visit: Payer: PRIVATE HEALTH INSURANCE | Attending: Internal Medicine | Admitting: Physical Therapy

## 2019-06-14 DIAGNOSIS — M6281 Muscle weakness (generalized): Secondary | ICD-10-CM | POA: Insufficient documentation

## 2019-06-14 DIAGNOSIS — M25552 Pain in left hip: Secondary | ICD-10-CM | POA: Insufficient documentation

## 2019-06-14 DIAGNOSIS — R2681 Unsteadiness on feet: Secondary | ICD-10-CM | POA: Insufficient documentation

## 2019-06-14 NOTE — Patient Instructions (Signed)
      Voncille Lo, PT Certified Exercise Expert for the Aging Adult  06/14/19 3:37 PM Phone: [phone] Fax: [phone]

## 2019-06-14 NOTE — Therapy (Signed)
[place], [place], [address] Phone: [phone]   Fax:  [phone]  Physical Therapy Evaluation  Patient Details  Name: Cassandra Park MRN: [id number] Date of Birth: 10-14-1960 Referring Provider (PT): Sid Falcon MD   Encounter Date: 06/14/2019  PT End of Session - 06/14/19 1526    Visit Number  1    Number of Visits  13    Date for PT Re-Evaluation  07/26/19    Authorization Type  CAFA    PT Start Time  1500    PT Stop Time  1542    PT Time Calculation (min)  42 min    Activity Tolerance  Patient tolerated treatment well    Behavior During Therapy  [facility] for tasks assessed/performed       Past Medical History:  Diagnosis Date  . Allergic rhinitis   . Allergy   . Diabetes mellitus without complication ([place])   . GERD (gastroesophageal reflux disease)   . Heart murmur   . 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 2 pap smears since then normal. Repeat pap in 3/07 at women's normal,.  . HTN (hypertension)   . Hx of colonic polyp 03/12/2017   7 mm hyperplastic transverse recall 2023  . Hyperlipidemia   . Keratoconus    f/u @ [facility]. Not needing corneal transplant.   . Osteoarthritis    Left hip  . Substance abuse ([place])    tobacco, alcohol  . Vaginal bleeding, abnormal June 2010   Thought to be 2/2 DUB vs endometrial CA. Gyn referral made at that time.    Past Surgical History:  Procedure Laterality Date  . TONSILLECTOMY      There were no vitals filed for this visit.   Subjective Assessment - 06/14/19 1504    Subjective  My left hip has  been  hurting for about 4-5 months. I had xrays taken and they said I have arthritis    Pertinent History  See medical history    Limitations  Sitting;Standing;Walking;House hold activities;Other (comment)   working as a Dietitian school   How long can you sit comfortably?  2 hour movie    How long can you stand  comfortably?  I stand from 7:30 to 2:00 in cafeteria    How long can you walk comfortably?  I can walk 15 minutes continuously    Patient Stated Goals  relieve the pain, cant sleep on left side, getting up to stand  hurts    Currently in Pain?  Yes    Pain Score  9     Pain Location  Hip    Pain Orientation  Left    Pain Descriptors / Indicators  Shooting;Sharp    Pain Type  Chronic pain    Pain Onset  More than a month ago    Pain Frequency  Intermittent    Aggravating Factors   sitting for too long and standing for too long  hard to get up out of chair         [facility] PT Assessment - 06/14/19 0001      Assessment   Medical Diagnosis  left acute hip pain    Referring Provider (PT)  Sid Falcon MD    Onset Date/Surgical Date  02/11/19    Hand Dominance  Right    Next MD Visit  3 months    Prior Therapy  not for hip only for shoulder when       Precautions   Precautions  None      Restrictions   Weight Bearing Restrictions  No      Balance Screen   Has the patient fallen in the past 6 months  No    Has the patient had a decrease in activity level because of a fear of falling?   No    Is the patient reluctant to leave their home because of a fear of falling?   No      Home Environment   Living Environment  Private residence    Living Arrangements  Alone    Type of Home  Apartment    Home Access  Stairs to enter    Entrance Stairs-Number of Steps  12    Entrance Stairs-Rails  Can reach both      Prior Function   Level of Independence  Independent      Cognition   Overall Cognitive Status  Within Functional Limits for tasks assessed      Observation/Other Assessments   Focus on Therapeutic Outcomes (FOTO)   FOTO intake 61% limitaiton 39% predicted 27       ROM / Strength   AROM / PROM / Strength  AROM;Strength      AROM   Overall AROM   Deficits    Right Hip Extension  15    Right Hip Flexion  100    Right Hip External Rotation   45    Right Hip Internal  Rotation   30    Left Hip Extension  -15   from neutral   Left Hip External Rotation   20    Left Hip Internal Rotation   15      Strength   Overall Strength  Deficits    Right Hip Flexion  4+/5    Right Hip Extension  4-/5    Right Hip External Rotation   4/5    Right Hip Internal Rotation  4/5    Right Hip ABduction  4-/5    Left Hip Flexion  4/5    Left Hip Extension  3-/5    Left Hip External Rotation  4-/5    Left Hip Internal Rotation  4-/5    Left Hip ABduction  3-/5      Palpation   Palpation comment  TTP at left psoas       Special Tests    Special Tests  Hip Special Tests      Thomas Test    Findings  Positive    Side  Left      Ober's Test   Findings  Positive    Side  Left      Transfers   Five time sit to stand comments   22.01 sec   high risk of fall     Ambulation/Gait   Ambulation Distance (Feet)  200 Feet    Assistive device  None    Gait Pattern  Decreased stride length;Antalgic;Decreased hip/knee flexion - left    Ambulation Surface  Level                Objective measurements completed on examination: See above findings.              PT Education - 06/14/19 1537    Education Details  POC Explanation of findings and importance of strengthening.  initial HEP    Person(s) Educated  Patient  Methods  Explanation;Demonstration;Tactile cues;Verbal cues;Handout    Comprehension  Verbalized understanding;Returned demonstration       PT Short Term Goals - 06/14/19 1527      PT SHORT TERM GOAL #1   Title  independent with initial HEP    Time  3    Period  Weeks    Status  New    Target Date  07/05/19      PT SHORT TERM GOAL #2   Title  Pt will be able to rise from seat without using momentum and in 22 sec or less 5 x STS to show increased LE strength    Time  3    Period  Weeks    Status  New    Target Date  07/05/19        PT Long Term Goals - 06/14/19 1529      PT LONG TERM GOAL #1   Title  Pt will be  independent with advanced HEP.    Time  6    Period  Weeks    Status  New    Target Date  07/26/19      PT LONG TERM GOAL #2   Title  FOTO will improve from  39% limitation  to  27 % limitation   indicating improved functional mobility    Time  6    Period  Weeks    Status  New    Target Date  07/26/19      PT LONG TERM GOAL #3   Title  Pain will decrease to 3/10 with all functional activities including stairs    Baseline  9/10 and avoids stairs    Time  6    Period  Weeks    Status  New    Target Date  07/26/19      PT LONG TERM GOAL #4   Title  Pt will rise from car after driving 30 minutes without exacerbating pain greater than 3/10    Baseline  9/10    Time  6    Period  Weeks    Status  New    Target Date  07/26/19      PT LONG TERM GOAL #5   Title  Pt will improve left hip extensor/flexor strength to >/= 4+/5 with </= 3/10 pain to promote safety with walking/standing activities    Time  6    Period  Weeks    Status  New    Target Date  07/26/19             Plan - 06/14/19 1638    Clinical Impression Statement  58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  Pt. also demonstrates LE weakness with 5 x STS at 22 seconds. Pt has difficulty coming to stand from sitting. without using momentum and some UE support.  Pt at risk for falls with 13. 0 seconds or less.   Pt will benefit from skilled PT to address impairments in AROM deficits, weakness and pain    Personal Factors and Comorbidities  Age;Comorbidity 1;Comorbidity 2    Comorbidities  Essential HTN, DM2, Obesity arthritis hx of fall    Examination-Activity Limitations  Squat;Stand;Transfers;Locomotion Level    Examination-Participation Restrictions  Cleaning;Meal Prep    Stability/Clinical Decision Making  Stable/Uncomplicated    Clinical Decision Making  Low    Rehab Potential  Good    PT Frequency  2x / week  PT Duration   6 weeks    PT Treatment/Interventions  Cryotherapy;Electrical Stimulation;Iontophoresis 4mg /ml Dexamethasone;Moist Heat;Ultrasound;Neuromuscular re-education;Therapeutic exercise;Therapeutic activities;Patient/family education;Functional mobility training;Stair training;Gait training    PT Next Visit Plan  Review exericises.  Hip and core stability,  work on left hip IR/ER modalities, psoas stretch and STW, hip joint mobs    PT Home Exercise Plan  sink squat, trunk rotation and left hip flexor squat       Patient will benefit from skilled therapeutic intervention in order to improve the following deficits and impairments:  Decreased mobility, Decreased range of motion, Decreased strength, Increased muscle spasms, Increased fascial restricitons, Obesity, Pain  Visit Diagnosis: Pain in left hip  Muscle weakness (generalized)     Problem List Patient Active Problem List   Diagnosis Date Noted  . Acute pain of left hip 05/11/2019  . It band syndrome, left 02/25/2019  . Hx of colonic polyp 03/12/2017  . Type 2 diabetes mellitus ([place]) 11/06/2016  . GERD (gastroesophageal reflux disease) 05/06/2016  . Healthcare maintenance 09/18/2015  . Overweight (BMI 25.0-29.9) 04/25/2014  . Allergic rhinitis 01/27/2012  . Preventative health care 01/27/2012  . Hyperlipidemia 10/19/2006  . Essential hypertension 10/19/2006  . Osteoarthritis 10/19/2006  Voncille Lo, PT Certified Exercise Expert for the Aging Adult  06/14/19 5:00 PM Phone: [phone] Fax: [place] [facility] [address] [place], [place], [address] Phone: [phone]   Fax:  [phone]  Name: Cassandra Park MRN: [id number] Date of Birth: 01-Nov-1960

## 2019-06-21 ENCOUNTER — Other Ambulatory Visit: Payer: Self-pay | Admitting: Internal Medicine

## 2019-06-21 DIAGNOSIS — I10 Essential (primary) hypertension: Secondary | ICD-10-CM

## 2019-06-27 ENCOUNTER — Encounter: Payer: Self-pay | Admitting: Physical Therapy

## 2019-06-27 ENCOUNTER — Other Ambulatory Visit: Payer: Self-pay

## 2019-06-27 ENCOUNTER — Ambulatory Visit: Payer: Self-pay | Attending: Internal Medicine | Admitting: Physical Therapy

## 2019-06-27 DIAGNOSIS — R2681 Unsteadiness on feet: Secondary | ICD-10-CM | POA: Insufficient documentation

## 2019-06-27 DIAGNOSIS — M25612 Stiffness of left shoulder, not elsewhere classified: Secondary | ICD-10-CM | POA: Insufficient documentation

## 2019-06-27 DIAGNOSIS — M6281 Muscle weakness (generalized): Secondary | ICD-10-CM | POA: Insufficient documentation

## 2019-06-27 DIAGNOSIS — M25552 Pain in left hip: Secondary | ICD-10-CM | POA: Insufficient documentation

## 2019-06-27 DIAGNOSIS — M25512 Pain in left shoulder: Secondary | ICD-10-CM | POA: Insufficient documentation

## 2019-06-27 NOTE — Therapy (Signed)
[place], [place], [address] Phone: [phone]   Fax:  [phone]  Physical Therapy Treatment  Patient Details  Name: Cassandra Park MRN: [id number] Date of Birth: 02/04/1961 Referring Provider (PT): Sid Falcon MD   Encounter Date: 06/27/2019  PT End of Session - 06/27/19 1503    Visit Number  2    Number of Visits  13    Date for PT Re-Evaluation  07/26/19    Authorization Type  CAFA    PT Start Time  1503    PT Stop Time  1541    PT Time Calculation (min)  38 min    Activity Tolerance  Patient tolerated treatment well    Behavior During Therapy  [facility] for tasks assessed/performed       Past Medical History:  Diagnosis Date  . Allergic rhinitis   . Allergy   . Diabetes mellitus without complication ([place])   . GERD (gastroesophageal reflux disease)   . Heart murmur   . 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 2 pap smears since then normal. Repeat pap in 3/07 at women's normal,.  . HTN (hypertension)   . Hx of colonic polyp 03/12/2017   7 mm hyperplastic transverse recall 2023  . Hyperlipidemia   . Keratoconus    f/u @ [facility] eye center. Not needing corneal transplant.   . Osteoarthritis    Left hip  . Substance abuse ([place])    tobacco, alcohol  . Vaginal bleeding, abnormal June 2010   Thought to be 2/2 DUB vs endometrial CA. Gyn referral made at that time.    Past Surgical History:  Procedure Laterality Date  . TONSILLECTOMY      There were no vitals filed for this visit.  Subjective Assessment - 06/27/19 1505    Subjective  "I think I am getting better, no pain today"    Patient Stated Goals  relieve the pain, cant sleep on left side, getting up to stand  hurts    Currently in Pain?  No/denies    Pain Location  Hip    Pain Orientation  Left    Pain Type  Chronic pain    Pain Onset  More than a month ago         [facility] PT Assessment - 06/27/19 0001      Assessment   Medical Diagnosis  left acute hip pain    Referring Provider (PT)  Sid Falcon MD                   [facility] Adult PT Treatment/Exercise - 06/27/19 0001      Exercises   Exercises  Knee/Hip      Knee/Hip Exercises: Stretches   Hip Flexor Stretch  30 seconds;Left;4 reps   2 x laying down and 2 x standing     Knee/Hip Exercises: Standing   Hip Abduction  2 sets;10 reps;Knee straight;Both;Stengthening   with red theraband   Forward Step Up  2 sets;10 reps;Step Height: 6"    Functional Squat  1 set;10 reps   with bil HHA from sink     Knee/Hip Exercises: Seated   Sit to Sand  2 sets;10 reps;without UE support   with ball between knees, focus on eccentric loading     Knee/Hip Exercises: Supine   Bridges  2 sets;10 reps;Strengthening;Both   halted at 5 on second set due to cramp in hamstring  Manual Therapy   Manual Therapy  Joint mobilization    Manual therapy comments  MTPR along the iliopsoas on the L x 2    Joint Mobilization  L hip PA/ AP grade III             PT Education - 06/27/19 1540    Education Details  updated HEP for hip strengtheing and hip flexor stretch    Person(s) Educated  Patient    Methods  Explanation;Verbal cues;Handout    Comprehension  Verbalized understanding;Verbal cues required       PT Short Term Goals - 06/14/19 1527      PT SHORT TERM GOAL #1   Title  independent with initial HEP    Time  3    Period  Weeks    Status  New    Target Date  07/05/19      PT SHORT TERM GOAL #2   Title  Pt will be able to rise from seat without using momentum and in 22 sec or less 5 x STS to show increased LE strength    Time  3    Period  Weeks    Status  New    Target Date  07/05/19        PT Long Term Goals - 06/14/19 1529      PT LONG TERM GOAL #1   Title  Pt will be independent with advanced HEP.    Time  6    Period  Weeks    Status  New    Target Date  07/26/19      PT LONG TERM GOAL #2   Title   FOTO will improve from  39% limitation  to  27 % limitation   indicating improved functional mobility    Time  6    Period  Weeks    Status  New    Target Date  07/26/19      PT LONG TERM GOAL #3   Title  Pain will decrease to 3/10 with all functional activities including stairs    Baseline  9/10 and avoids stairs    Time  6    Period  Weeks    Status  New    Target Date  07/26/19      PT LONG TERM GOAL #4   Title  Pt will rise from car after driving 30 minutes without exacerbating pain greater than 3/10    Baseline  9/10    Time  6    Period  Weeks    Status  New    Target Date  07/26/19      PT LONG TERM GOAL #5   Title  Pt will improve left hip extensor/flexor strength to >/= 4+/5 with </= 3/10 pain to promote safety with walking/standing activities    Time  6    Period  Weeks    Status  New    Target Date  07/26/19            Plan - 06/27/19 1541    Clinical Impression Statement  pt reports no pain today. continued working on hip ROM/ STW to reduce muscle tension,  and strengthening which she did very well with today. updated HEP for standing hip stretching and strengthening.    PT Treatment/Interventions  Cryotherapy;Electrical Stimulation;Iontophoresis 4mg /ml Dexamethasone;Moist Heat;Ultrasound;Neuromuscular re-education;Therapeutic exercise;Therapeutic activities;Patient/family education;Functional mobility training;Stair training;Gait training    PT Next Visit Plan  Review exericises.  Hip and core stability,  work  on left hip IR/ER modalities, psoas stretch and STW, hip joint mobs    PT Home Exercise Plan  sink squat, trunk rotation and left hip flexor squat, standing hip flexor stretching, standing hip abduction    Consulted and Agree with Plan of Care  Patient       Patient will benefit from skilled therapeutic intervention in order to improve the following deficits and impairments:  Decreased mobility, Decreased range of motion, Decreased strength, Increased  muscle spasms, Increased fascial restricitons, Obesity, Pain  Visit Diagnosis: Pain in left hip  Muscle weakness (generalized)  Unsteadiness on feet     Problem List Patient Active Problem List   Diagnosis Date Noted  . Acute pain of left hip 05/11/2019  . It band syndrome, left 02/25/2019  . Hx of colonic polyp 03/12/2017  . Type 2 diabetes mellitus ([place]) 11/06/2016  . GERD (gastroesophageal reflux disease) 05/06/2016  . Healthcare maintenance 09/18/2015  . Overweight (BMI 25.0-29.9) 04/25/2014  . Allergic rhinitis 01/27/2012  . Preventative health care 01/27/2012  . Hyperlipidemia 10/19/2006  . Essential hypertension 10/19/2006  . Osteoarthritis 10/19/2006   Starr Lake PT, DPT, LAT, ATC  06/27/19  3:43 PM      [place] [facility] [address] [place], [place], [address] Phone: [phone]   Fax:  [phone]  Name: LOLLA WARNES MRN: [id number] Date of Birth: April 07, 1961

## 2019-06-28 ENCOUNTER — Ambulatory Visit: Payer: Self-pay | Admitting: Internal Medicine

## 2019-06-28 ENCOUNTER — Encounter: Payer: Self-pay | Admitting: Internal Medicine

## 2019-06-28 DIAGNOSIS — K219 Gastro-esophageal reflux disease without esophagitis: Secondary | ICD-10-CM

## 2019-06-28 DIAGNOSIS — I10 Essential (primary) hypertension: Secondary | ICD-10-CM

## 2019-06-28 DIAGNOSIS — E785 Hyperlipidemia, unspecified: Secondary | ICD-10-CM

## 2019-06-28 DIAGNOSIS — H5462 Unqualified visual loss, left eye, normal vision right eye: Secondary | ICD-10-CM

## 2019-06-28 DIAGNOSIS — Z79899 Other long term (current) drug therapy: Secondary | ICD-10-CM

## 2019-06-28 DIAGNOSIS — H5440 Blindness, one eye, unspecified eye: Secondary | ICD-10-CM

## 2019-06-28 DIAGNOSIS — Z23 Encounter for immunization: Secondary | ICD-10-CM

## 2019-06-28 DIAGNOSIS — E119 Type 2 diabetes mellitus without complications: Secondary | ICD-10-CM

## 2019-06-28 DIAGNOSIS — M1612 Unilateral primary osteoarthritis, left hip: Secondary | ICD-10-CM

## 2019-06-28 DIAGNOSIS — M25552 Pain in left hip: Secondary | ICD-10-CM

## 2019-06-28 MED ORDER — PANTOPRAZOLE SODIUM 40 MG PO TBEC: 40.0000 mg | DELAYED_RELEASE_TABLET | Freq: Every day | ORAL | 1 refills | Status: DC

## 2019-06-28 NOTE — Patient Instructions (Addendum)
Dear [name],  Thank you for allowing [place] to provide your care today. Today we discussed your leg pain. I am so glad your symptoms are improving. Please continue to attend your physical therapy sessions.    I have ordered no labs for you. I will call if any are abnormal.    Today we made the following changes to your medications:    Please follow-up in 6 months.    Should you have any questions or concerns please call the internal medicine clinic at [phone].    Thank you for choosing [facility].   Food Choices for Gastroesophageal Reflux Disease, Adult When you have gastroesophageal reflux disease (GERD), the foods you eat and your eating habits are very important. Choosing the right foods can help ease your discomfort. Think about working with a nutrition specialist (dietitian) to help you make good choices. What are tips for following this plan?  Meals  Choose healthy foods that are low in fat, such as fruits, vegetables, whole grains, low-fat dairy products, and lean meat, fish, and poultry.  Eat small meals often instead of 3 large meals a day. Eat your meals slowly, and in a place where you are relaxed. Avoid bending over or lying down until 2-3 hours after eating.  Avoid eating meals 2-3 hours before bed.  Avoid drinking a lot of liquid with meals.  Cook foods using methods other than frying. Bake, grill, or broil food instead.  Avoid or limit: ? Chocolate. ? Peppermint or spearmint. ? Alcohol. ? Pepper. ? Black and decaffeinated coffee. ? Black and decaffeinated tea. ? Bubbly (carbonated) soft drinks. ? Caffeinated energy drinks and soft drinks.  Limit high-fat foods such as: ? Fatty meat or fried foods. ? Whole milk, cream, butter, or ice cream. ? Nuts and nut butters. ? Pastries, donuts, and sweets made with butter or shortening.  Avoid foods that cause symptoms. These foods may be different for everyone. Common foods that cause symptoms  include: ? Tomatoes. ? Oranges, lemons, and limes. ? Peppers. ? Spicy food. ? Onions and garlic. ? Vinegar. Lifestyle  Maintain a healthy weight. Ask your doctor what weight is healthy for you. If you need to lose weight, work with your doctor to do so safely.  Exercise for at least 30 minutes for 5 or more days each week, or as told by your doctor.  Wear loose-fitting clothes.  Do not smoke. If you need help quitting, ask your doctor.  Sleep with the head of your bed higher than your feet. Use a wedge under the mattress or blocks under the bed frame to raise the head of the bed. Summary  When you have gastroesophageal reflux disease (GERD), food and lifestyle choices are very important in easing your symptoms.  Eat small meals often instead of 3 large meals a day. Eat your meals slowly, and in a place where you are relaxed.  Limit high-fat foods such as fatty meat or fried foods.  Avoid bending over or lying down until 2-3 hours after eating.  Avoid peppermint and spearmint, caffeine, alcohol, and chocolate. This information is not intended to replace advice given to you by your health care provider. Make sure you discuss any questions you have with your health care provider. Document Released: 03/02/2012 Document Revised: 12/23/2018 Document Reviewed: 10/07/2016 Elsevier Patient Education  2020 [employer].

## 2019-06-28 NOTE — Progress Notes (Signed)
CC: Left leg pain  HPI: Ms.Lalitha Viona Gilmore Olivencia is a 58 y.o. F w/ PMh of GERD, T2DM, HLD, HTN, left eye blindness who presents for management of her chronic conditions and follow up for her hip pain. She states her hip pain has been steadily improving with physical therapy. Reviewed hip x-ray with patient and discussed findings of osteoarthritis. Encouraged continued attendance with physical therapy. Mrs.Osbourne expressed understanding and also confirmed that at this point she will like to focus on non-operative interventions. Denies any numbness, tingling, urinary/bowel incontinence, or saddle anesthesia  Past Medical History:  Diagnosis Date  . Allergic rhinitis   . Allergy   . Diabetes mellitus without complication ([place])   . GERD (gastroesophageal reflux disease)   . Heart murmur   . 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 2 pap smears since then normal. Repeat pap in 3/07 at women's normal,.  . HTN (hypertension)   . Hx of colonic polyp 03/12/2017   7 mm hyperplastic transverse recall 2023  . Hyperlipidemia   . Keratoconus    f/u @ [facility] eye center. Not needing corneal transplant.   . Osteoarthritis    Left hip  . Substance abuse ([place])    tobacco, alcohol  . Vaginal bleeding, abnormal June 2010   Thought to be 2/2 DUB vs endometrial CA. Gyn referral made at that time.    Review of Systems: ROS   Physical Exam: Vitals:   06/28/19 1501 06/28/19 1536  BP: (!) 148/73 126/74  Pulse: 84   Temp: 98.7 F (37.1 C)   TempSrc: Oral   SpO2: 99%   Weight: 198 lb 14.4 oz (90.2 kg)   Height: 5\' 9"  (1.753 m)    Physical Exam  Constitutional: She is oriented to person, place, and time. She appears well-developed and well-nourished. No distress.  HENT:  Mouth/Throat: Oropharynx is clear and moist.  Eyes:  Blind left eye  Neck: Normal range of motion. No JVD present.  Cardiovascular: Normal rate, regular rhythm, normal heart sounds and intact distal pulses.  Respiratory:  Effort normal and breath sounds normal. She has no wheezes. She has no rales.  GI: Soft. Bowel sounds are normal. She exhibits no distension. There is no abdominal tenderness.  Musculoskeletal: Normal range of motion.        General: Tenderness (Active ROM limited by pain, worse with abduction, Passive ROM intact, No tenderness to palpation, no erythema, edema or warmth.) present. No edema.  Neurological: She is alert and oriented to person, place, and time.  Skin: Skin is warm and dry.   Assessment & Plan:   Essential hypertension BP Readings from Last 3 Encounters:  06/28/19 126/74  05/11/19 140/75  02/22/19 116/72   BP at goal. Denies any headache, blurry vision, lower extremity edema, chest pain, palpitations, dyspnea or cough.   - C/w current regimen: amlodipine 10mg , triamterene-HCTZ 37.5-25mg , Coreg 6.25mg  BID  GERD (gastroesophageal reflux disease) Pt requires refills on medications with associated diagnosis above.  Reviewed disease process and find this medication to be necessary, will not change dose or alter current therapy.  Acute pain of left hip Presents for f/u for hip pain. Improving significantly with physical therapy. Not over-using NSAIDs. Active ROM still elicits pain but not as severe as prior. X-ray w/ significant space narrowing and osteoarthritis. Agrees to continue with physical therapy.  - Encouraged to continue physical therapy.  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 She mentions seeing one regularly in the past and has been lost to f/u. Discussed importance of preserving vision of her 'good eye' She states she will make an appointment to f/u. Currently denies any current blurry vision.  Need for immunization against influenza Discussed need for immunization against influenza. Discussed risks and benefits of being vaccinated. She stated clear understanding and refused vaccination at  this time.    Patient discussed with Dr. Evette Doffing   -Gilberto Better, PGY2 [facility] Pager: [phone]

## 2019-06-29 ENCOUNTER — Encounter: Payer: Self-pay | Admitting: Internal Medicine

## 2019-06-29 ENCOUNTER — Ambulatory Visit: Payer: Self-pay | Admitting: Physical Therapy

## 2019-06-29 ENCOUNTER — Encounter: Payer: Self-pay | Admitting: Physical Therapy

## 2019-06-29 ENCOUNTER — Other Ambulatory Visit: Payer: Self-pay

## 2019-06-29 DIAGNOSIS — R2681 Unsteadiness on feet: Secondary | ICD-10-CM

## 2019-06-29 DIAGNOSIS — M6281 Muscle weakness (generalized): Secondary | ICD-10-CM

## 2019-06-29 DIAGNOSIS — Z23 Encounter for immunization: Secondary | ICD-10-CM | POA: Insufficient documentation

## 2019-06-29 DIAGNOSIS — H5440 Blindness, one eye, unspecified eye: Secondary | ICD-10-CM | POA: Insufficient documentation

## 2019-06-29 DIAGNOSIS — M25512 Pain in left shoulder: Secondary | ICD-10-CM

## 2019-06-29 DIAGNOSIS — M25552 Pain in left hip: Secondary | ICD-10-CM

## 2019-06-29 DIAGNOSIS — M25612 Stiffness of left shoulder, not elsewhere classified: Secondary | ICD-10-CM

## 2019-06-29 NOTE — Assessment & Plan Note (Signed)
Discussed need for immunization against influenza. Discussed risks and benefits of being vaccinated. She stated clear understanding and refused vaccination at this time.

## 2019-06-29 NOTE — Assessment & Plan Note (Signed)
BP Readings from Last 3 Encounters:  06/28/19 126/74  05/11/19 140/75  02/22/19 116/72   BP at goal. Denies any headache, blurry vision, lower extremity edema, chest pain, palpitations, dyspnea or cough.   - C/w current regimen: amlodipine 10mg , triamterene-HCTZ 37.5-25mg , Coreg 6.25mg  BID

## 2019-06-29 NOTE — Assessment & Plan Note (Signed)
Discussed her chronic blind left eye and encouraged patient to follow up with her ophthalmologist. She mentions seeing one regularly in the past and has been lost to f/u. Discussed importance of preserving vision of her 'good eye' She states she will make an appointment to f/u. Currently denies any current blurry vision.

## 2019-06-29 NOTE — Assessment & Plan Note (Signed)
Presents for f/u for hip pain. Improving significantly with physical therapy. Not over-using NSAIDs. Active ROM still elicits pain but not as severe as prior. X-ray w/ significant space narrowing and osteoarthritis. Agrees to continue with physical therapy.  - Encouraged to continue physical therapy.

## 2019-06-29 NOTE — Assessment & Plan Note (Signed)
Pt requires refills on medications with associated diagnosis above.  Reviewed disease process and find this medication to be necessary, will not change dose or alter current therapy. 

## 2019-06-29 NOTE — Therapy (Signed)
[place], [place], [address] Phone: [phone]   Fax:  [phone]  Physical Therapy Treatment  Patient Details  Name: Cassandra Park MRN: [id number] Date of Birth: Dec 12, 1960 Referring Provider (PT): Sid Falcon MD   Encounter Date: 06/29/2019  PT End of Session - 06/29/19 1502    Visit Number  3    Number of Visits  13    Date for PT Re-Evaluation  07/26/19    Authorization Type  CAFA    PT Start Time  1502    PT Stop Time  1540    PT Time Calculation (min)  38 min    Activity Tolerance  Patient tolerated treatment well    Behavior During Therapy  [facility] for tasks assessed/performed       Past Medical History:  Diagnosis Date  . Allergic rhinitis   . Allergy   . Diabetes mellitus without complication ([place])   . GERD (gastroesophageal reflux disease)   . Heart murmur   . 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 2 pap smears since then normal. Repeat pap in 3/07 at [facility] normal,.  . HTN (hypertension)   . Hx of colonic polyp 03/12/2017   7 mm hyperplastic transverse recall 2023  . Hyperlipidemia   . Keratoconus    f/u @ [facility] eye center. Not needing corneal transplant.   . Osteoarthritis    Left hip  . Substance abuse ([place])    tobacco, alcohol  . Vaginal bleeding, abnormal June 2010   Thought to be 2/2 DUB vs endometrial CA. Gyn referral made at that time.    Past Surgical History:  Procedure Laterality Date  . TONSILLECTOMY      There were no vitals filed for this visit.  Subjective Assessment - 06/29/19 1504    Subjective  "I got a little soreness popping up quickly leaving work but it went"    Patient Stated Goals  relieve the pain, cant sleep on left side, getting up to stand  hurts    Currently in Pain?  No/denies    Pain Onset  More than a month ago    Pain Frequency  Intermittent                       [facility] Adult PT Treatment/Exercise - 06/29/19  0001      Knee/Hip Exercises: Stretches   Hip Flexor Stretch  4 reps;30 seconds;Left;Both   2 x PNF contract/ relax in supine, 2 x in standing     Knee/Hip Exercises: Aerobic   Nustep  L5 x 5 min LE only      Knee/Hip Exercises: Standing   Hip Abduction  2 sets;10 reps;Knee straight    Hip Extension  Stengthening;Both;2 sets;Knee straight    Functional Squat  1 set;10 reps    Gait Training  heel strike/ toe off gait pattern using exaggerated motions 4 x 20 ft      Knee/Hip Exercises: Seated   Other Seated Knee/Hip Exercises  seated on dyna disc 1 x 10 anterior pelvci tilt, marching 2 x 20 alternating later      Manual Therapy   Manual therapy comments  MTPR along the iliopsoas on the L x 2    Joint Mobilization  L hip PA/ AP grade III          Balance Exercises - 06/29/19 1532      Balance Exercises:  Standing   Standing Eyes Opened  Narrow base of support (BOS);2 reps;30 secs    Standing Eyes Closed  Narrow base of support (BOS);2 reps;30 secs    Tandem Stance  Eyes open;4 reps;30 secs   2 x changing lead leg         PT Short Term Goals - 06/14/19 1527      PT SHORT TERM GOAL #1   Title  independent with initial HEP    Time  3    Period  Weeks    Status  New    Target Date  07/05/19      PT SHORT TERM GOAL #2   Title  Pt will be able to rise from seat without using momentum and in 22 sec or less 5 x STS to show increased LE strength    Time  3    Period  Weeks    Status  New    Target Date  07/05/19        PT Long Term Goals - 06/14/19 1529      PT LONG TERM GOAL #1   Title  Pt will be independent with advanced HEP.    Time  6    Period  Weeks    Status  New    Target Date  07/26/19      PT LONG TERM GOAL #2   Title  FOTO will improve from  39% limitation  to  27 % limitation   indicating improved functional mobility    Time  6    Period  Weeks    Status  New    Target Date  07/26/19      PT LONG TERM GOAL #3   Title  Pain will decrease to  3/10 with all functional activities including stairs    Baseline  9/10 and avoids stairs    Time  6    Period  Weeks    Status  New    Target Date  07/26/19      PT LONG TERM GOAL #4   Title  Pt will rise from car after driving 30 minutes without exacerbating pain greater than 3/10    Baseline  9/10    Time  6    Period  Weeks    Status  New    Target Date  07/26/19      PT LONG TERM GOAL #5   Title  Pt will improve left hip extensor/flexor strength to >/= 4+/5 with </= 3/10 pain to promote safety with walking/standing activities    Time  6    Period  Weeks    Status  New    Target Date  07/26/19            Plan - 06/29/19 1540    Clinical Impression Statement  pt continues report no pain today. continued working hip ROM and strengthening which she continue sto respond to well. practiced gait training which she tends to limited heel strike/ toe off and has increased lateral sway.    PT Treatment/Interventions  Cryotherapy;Electrical Stimulation;Iontophoresis 4mg /ml Dexamethasone;Moist Heat;Ultrasound;Neuromuscular re-education;Therapeutic exercise;Therapeutic activities;Patient/family education;Functional mobility training;Stair training;Gait training    PT Next Visit Plan  Review exericises.  Hip and core stability,  work on left hip IR/ER modalities, psoas stretch and STW, hip joint mobs, update HEP For balance and core strengthening, seated posture    PT Home Exercise Plan  sink squat, trunk rotation and left hip flexor squat, standing hip flexor stretching,  standing hip abduction    Consulted and Agree with Plan of Care  Patient       Patient will benefit from skilled therapeutic intervention in order to improve the following deficits and impairments:  Decreased mobility, Decreased range of motion, Decreased strength, Increased muscle spasms, Increased fascial restricitons, Obesity, Pain  Visit Diagnosis: Pain in left hip  Muscle weakness (generalized)  Unsteadiness  on feet  Stiffness of left shoulder, not elsewhere classified  Acute pain of left shoulder     Problem List Patient Active Problem List   Diagnosis Date Noted  . Acute pain of left hip 05/11/2019  . It band syndrome, left 02/25/2019  . Hx of colonic polyp 03/12/2017  . Type 2 diabetes mellitus ([place]) 11/06/2016  . GERD (gastroesophageal reflux disease) 05/06/2016  . Healthcare maintenance 09/18/2015  . Overweight (BMI 25.0-29.9) 04/25/2014  . Allergic rhinitis 01/27/2012  . Preventative health care 01/27/2012  . Hyperlipidemia 10/19/2006  . Essential hypertension 10/19/2006  . Osteoarthritis 10/19/2006   Starr Lake PT, DPT, LAT, ATC  06/29/19  3:44 PM      [facility] [address] [place], [place], [address] Phone: [phone]   Fax:  [phone]  Name: Cassandra Park MRN: [id number] Date of Birth: 1961/09/14

## 2019-06-30 NOTE — Addendum Note (Signed)
Addended by: Lalla Brothers T on: 06/30/2019 08:56 AM   Modules accepted: Level of Service

## 2019-06-30 NOTE — Progress Notes (Signed)
Internal Medicine Clinic Attending ° °Case discussed with Dr. Lee at the time of the visit.  We reviewed the resident’s history and exam and pertinent patient test results.  I agree with the assessment, diagnosis, and plan of care documented in the resident’s note.  °

## 2019-07-04 ENCOUNTER — Ambulatory Visit: Payer: Self-pay | Admitting: Physical Therapy

## 2019-07-06 ENCOUNTER — Other Ambulatory Visit: Payer: Self-pay

## 2019-07-06 ENCOUNTER — Ambulatory Visit: Payer: Self-pay | Admitting: Physical Therapy

## 2019-07-06 ENCOUNTER — Encounter: Payer: Self-pay | Admitting: Physical Therapy

## 2019-07-06 DIAGNOSIS — M25552 Pain in left hip: Secondary | ICD-10-CM

## 2019-07-06 DIAGNOSIS — M6281 Muscle weakness (generalized): Secondary | ICD-10-CM

## 2019-07-06 DIAGNOSIS — R2681 Unsteadiness on feet: Secondary | ICD-10-CM

## 2019-07-06 NOTE — Therapy (Signed)
[place], [place], [address] Phone: [phone]   Fax:  [phone]  Physical Therapy Treatment  Patient Details  Name: Cassandra Park MRN: [id number] Date of Birth: [birth year] Referring Provider (PT): Sid Falcon MD   Encounter Date: 07/06/2019  PT End of Session - 07/06/19 1452    Visit Number  4    Number of Visits  13    Date for PT Re-Evaluation  07/26/19    Authorization Type  CAFA    PT Start Time  1415    PT Stop Time  1453    PT Time Calculation (min)  38 min    Activity Tolerance  Patient tolerated treatment well    Behavior During Therapy  [facility] for tasks assessed/performed       Past Medical History:  Diagnosis Date  . Allergic rhinitis   . Allergy   . Diabetes mellitus without complication ([place])   . GERD (gastroesophageal reflux disease)   . Heart murmur   . 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 2 pap smears since then normal. Repeat pap in 3/07 at [facility] normal,.  . HTN (hypertension)   . Hx of colonic polyp 03/12/2017   7 mm hyperplastic transverse recall 2023  . Hyperlipidemia   . Keratoconus    f/u @ [facility] eye center. Not needing corneal transplant.   . Osteoarthritis    Left hip  . Substance abuse ([place])    tobacco, alcohol  . Vaginal bleeding, abnormal June 2010   Thought to be 2/2 DUB vs endometrial CA. Gyn referral made at that time.    Past Surgical History:  Procedure Laterality Date  . TONSILLECTOMY      There were no vitals filed for this visit.  Subjective Assessment - 07/06/19 1418    Subjective  "I am alittle more sore earlirer today but it only lasted a few minutes"    Patient Stated Goals  relieve the pain, cant sleep on left side, getting up to stand  hurts    Currently in Pain?  No/denies    Pain Score  0-No pain   today soreness increased 2/10   Pain Orientation  Left    Pain Type  Chronic pain    Pain Onset  More than a month ago     Pain Frequency  Intermittent    Aggravating Factors   sitting and getting out of a chair.                       [facility] Adult PT Treatment/Exercise - 07/06/19 0001      Knee/Hip Exercises: Aerobic   Nustep  L6 x 5 LE      Knee/Hip Exercises: Machines for Strengthening   Cybex Knee Extension  2 x 12 15# bil LE    Cybex Knee Flexion  2x 15 25# bil LE    Cybex Leg Press  2 x 15 40# bil LE      Knee/Hip Exercises: Standing   Hip Abduction  Stengthening;2 sets;10 reps;Knee straight   with red therdaband, focus on eccentric loading   Hip Extension  Stengthening;Both;2 sets;10 reps;Knee straight;Other (comment)   with red theraband   Forward Step Up  1 set;15 reps;Step Height: 6";2 sets;Step Height: 8"   1 set               PT Short Term Goals - 06/14/19 1527  PT SHORT TERM GOAL #1   Title  independent with initial HEP    Time  3    Period  Weeks    Status  New    Target Date  07/05/19      PT SHORT TERM GOAL #2   Title  Pt will be able to rise from seat without using momentum and in 22 sec or less 5 x STS to show increased LE strength    Time  3    Period  Weeks    Status  New    Target Date  07/05/19        PT Long Term Goals - 06/14/19 1529      PT LONG TERM GOAL #1   Title  Pt will be independent with advanced HEP.    Time  6    Period  Weeks    Status  New    Target Date  07/26/19      PT LONG TERM GOAL #2   Title  FOTO will improve from  39% limitation  to  27 % limitation   indicating improved functional mobility    Time  6    Period  Weeks    Status  New    Target Date  07/26/19      PT LONG TERM GOAL #3   Title  Pain will decrease to 3/10 with all functional activities including stairs    Baseline  9/10 and avoids stairs    Time  6    Period  Weeks    Status  New    Target Date  07/26/19      PT LONG TERM GOAL #4   Title  Pt will rise from car after driving 30 minutes without exacerbating pain greater than 3/10     Baseline  9/10    Time  6    Period  Weeks    Status  New    Target Date  07/26/19      PT LONG TERM GOAL #5   Title  Pt will improve left hip extensor/flexor strength to >/= 4+/5 with </= 3/10 pain to promote safety with walking/standing activities    Time  6    Period  Weeks    Status  New    Target Date  07/26/19            Plan - 07/06/19 1455    Clinical Impression Statement  pt reports continued improvement in pain and stiffness, but did state she had 1 fleeting moment of hip soreness after sitting for long period of time. Focused session on hip/ thigh strengthening which she did very well, requiring minimal cues for proper form. no report of pain or stiffness end of session.    PT Treatment/Interventions  Cryotherapy;Electrical Stimulation;Iontophoresis 4mg /ml Dexamethasone;Moist Heat;Ultrasound;Neuromuscular re-education;Therapeutic exercise;Therapeutic activities;Patient/family education;Functional mobility training;Stair training;Gait training    PT Next Visit Plan  Hip and core stability, work on left hip IR/ER modalities, psoas stretch and STW, hip joint mobs, update HEP For balance and core strengthening, seated posture    PT Home Exercise Plan  sink squat, trunk rotation and left hip flexor squat, standing hip flexor stretching, standing hip abduction    Consulted and Agree with Plan of Care  Patient       Patient will benefit from skilled therapeutic intervention in order to improve the following deficits and impairments:  Decreased mobility, Decreased range of motion, Decreased strength, Increased muscle spasms, Increased fascial restricitons, Obesity,  Pain  Visit Diagnosis: Pain in left hip  Muscle weakness (generalized)  Unsteadiness on feet     Problem List Patient Active Problem List   Diagnosis Date Noted  . 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 Need for immunization against influenza 06/29/2019  . Acute pain of left hip 05/11/2019  . Hx of colonic polyp  03/12/2017  . Type 2 diabetes mellitus ([place]) 11/06/2016  . GERD (gastroesophageal reflux disease) 05/06/2016  . Healthcare maintenance 09/18/2015  . Overweight (BMI 25.0-29.9) 04/25/2014  . Allergic rhinitis 01/27/2012  . Preventative health care 01/27/2012  . Hyperlipidemia 10/19/2006  . Essential hypertension 10/19/2006  . Osteoarthritis 10/19/2006   Starr Lake PT, DPT, LAT, ATC  07/06/19  2:59 PM      [place] [facility] [address] [place], [place], [address] Phone: [phone]   Fax:  [phone]  Name: Cassandra Park MRN: [id number] Date of Birth: December 15, 1960

## 2019-07-11 ENCOUNTER — Encounter: Payer: Self-pay | Admitting: Physical Therapy

## 2019-07-11 ENCOUNTER — Other Ambulatory Visit: Payer: Self-pay

## 2019-07-11 ENCOUNTER — Ambulatory Visit: Payer: Self-pay | Admitting: Physical Therapy

## 2019-07-11 DIAGNOSIS — M25552 Pain in left hip: Secondary | ICD-10-CM

## 2019-07-11 DIAGNOSIS — M6281 Muscle weakness (generalized): Secondary | ICD-10-CM

## 2019-07-11 DIAGNOSIS — R2681 Unsteadiness on feet: Secondary | ICD-10-CM

## 2019-07-11 NOTE — Therapy (Signed)
[place], [place], [address] Phone: [phone]   Fax:  [phone]  Physical Therapy Treatment  Patient Details  Name: Cassandra Park MRN: [id number] Date of Birth: [birth year] Referring Provider (PT): Sid Falcon MD   Encounter Date: 07/11/2019  PT End of Session - 07/11/19 1731    Visit Number  5    Number of Visits  13    Date for PT Re-Evaluation  07/26/19    Authorization Type  CAFA    PT Start Time  1500    PT Stop Time  1540    PT Time Calculation (min)  40 min    Activity Tolerance  Patient tolerated treatment well    Behavior During Therapy  [facility] for tasks assessed/performed       Past Medical History:  Diagnosis Date  . Allergic rhinitis   . Allergy   . Diabetes mellitus without complication ([place])   . GERD (gastroesophageal reflux disease)   . Heart murmur   . 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 2 pap smears since then normal. Repeat pap in 3/07 at women's normal,.  . HTN (hypertension)   . Hx of colonic polyp 03/12/2017   7 mm hyperplastic transverse recall 2023  . Hyperlipidemia   . Keratoconus    f/u @ [facility] eye center. Not needing corneal transplant.   . Osteoarthritis    Left hip  . Substance abuse ([place])    tobacco, alcohol  . Vaginal bleeding, abnormal June 2010   Thought to be 2/2 DUB vs endometrial CA. Gyn referral made at that time.    Past Surgical History:  Procedure Laterality Date  . TONSILLECTOMY      There were no vitals filed for this visit.  Subjective Assessment - 07/11/19 1735    Subjective  "no issues today and I am doing pretty good. I had no catching in my hip"    Currently in Pain?  No/denies              Treatment/ Exercise  .  Nu-step: L7 x 6 min LE only . L hip flexor stretch 2 x 30 sec standing . 4-way hip strengthening in // red theraband 2 x 15 ea.  . Functional squat with bil UE holding on to bars 2 x  10 . Machine knee extension 2 x 15 20# . Machine knee flexion 2 x 15 35# . Machine leg press 2 x 15 50# with ball between knees to promote proper knee alignment . Rhomberg balance 2 x 30 seconds in // . Tandem stance 2 x 30 seconds with R/L LE leading mod postural sway                 PT Short Term Goals - 06/14/19 1527      PT SHORT TERM GOAL #1   Title  independent with initial HEP    Time  3    Period  Weeks    Status  New    Target Date  07/05/19      PT SHORT TERM GOAL #2   Title  Pt will be able to rise from seat without using momentum and in 22 sec or less 5 x STS to show increased LE strength    Time  3    Period  Weeks    Status  New    Target Date  07/05/19  PT Long Term Goals - 06/14/19 1529      PT LONG TERM GOAL #1   Title  Pt will be independent with advanced HEP.    Time  6    Period  Weeks    Status  New    Target Date  07/26/19      PT LONG TERM GOAL #2   Title  FOTO will improve from  39% limitation  to  27 % limitation   indicating improved functional mobility    Time  6    Period  Weeks    Status  New    Target Date  07/26/19      PT LONG TERM GOAL #3   Title  Pain will decrease to 3/10 with all functional activities including stairs    Baseline  9/10 and avoids stairs    Time  6    Period  Weeks    Status  New    Target Date  07/26/19      PT LONG TERM GOAL #4   Title  Pt will rise from car after driving 30 minutes without exacerbating pain greater than 3/10    Baseline  9/10    Time  6    Period  Weeks    Status  New    Target Date  07/26/19      PT LONG TERM GOAL #5   Title  Pt will improve left hip extensor/flexor strength to >/= 4+/5 with </= 3/10 pain to promote safety with walking/standing activities    Time  6    Period  Weeks    Status  New    Target Date  07/26/19            Plan - 07/11/19 1732    Clinical Impression Statement  pt reports no pain today or issues. Focused on hip/ knee  strengthening and balance training which she did really well with no report of issues or complaints. Plan to reassess at next visit and determine if more treatment is necessary.    PT Treatment/Interventions  Cryotherapy;Electrical Stimulation;Iontophoresis 4mg /ml Dexamethasone;Moist Heat;Ultrasound;Neuromuscular re-education;Therapeutic exercise;Therapeutic activities;Patient/family education;Functional mobility training;Stair training;Gait training    PT Next Visit Plan  Reassess, hip/ core strengthening, balance training,    PT Home Exercise Plan  sink squat, trunk rotation and left hip flexor squat, standing hip flexor stretching, standing hip abduction       Patient will benefit from skilled therapeutic intervention in order to improve the following deficits and impairments:  Decreased mobility, Decreased range of motion, Decreased strength, Increased muscle spasms, Increased fascial restricitons, Obesity, Pain  Visit Diagnosis: Pain in left hip  Muscle weakness (generalized)  Unsteadiness on feet     Problem List Patient Active Problem List   Diagnosis Date Noted  . 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 Need for immunization against influenza 06/29/2019  . Acute pain of left hip 05/11/2019  . Hx of colonic polyp 03/12/2017  . Type 2 diabetes mellitus ([place]) 11/06/2016  . GERD (gastroesophageal reflux disease) 05/06/2016  . Healthcare maintenance 09/18/2015  . Overweight (BMI 25.0-29.9) 04/25/2014  . Allergic rhinitis 01/27/2012  . Preventative health care 01/27/2012  . Hyperlipidemia 10/19/2006  . Essential hypertension 10/19/2006  . Osteoarthritis 10/19/2006   Starr Lake PT, DPT, LAT, ATC  07/11/19  5:35 PM      [facility] [address] [place], [place], [address] Phone: [phone]   Fax:  [phone]  Name: Cassandra Park  MRN: [id number] Date of Birth: 05-14-1961

## 2019-07-13 ENCOUNTER — Ambulatory Visit: Payer: Self-pay | Admitting: Physical Therapy

## 2019-07-13 ENCOUNTER — Encounter: Payer: Self-pay | Admitting: Physical Therapy

## 2019-07-13 ENCOUNTER — Other Ambulatory Visit: Payer: Self-pay

## 2019-07-13 DIAGNOSIS — M25552 Pain in left hip: Secondary | ICD-10-CM

## 2019-07-13 DIAGNOSIS — M6281 Muscle weakness (generalized): Secondary | ICD-10-CM

## 2019-07-13 DIAGNOSIS — R2681 Unsteadiness on feet: Secondary | ICD-10-CM

## 2019-07-13 NOTE — Therapy (Signed)
[place], [place], [address] Phone: [phone]   Fax:  [phone]  Physical Therapy Treatment / discharge  Patient Details  Name: TAUHEEDAH BOK MRN: [id number] Date of Birth: 09-29-1960 Referring Provider (PT): Sid Falcon MD   Encounter Date: 07/13/2019  PT End of Session - 07/13/19 1503    Visit Number  6    Number of Visits  13    Date for PT Re-Evaluation  07/26/19    Authorization Type  CAFA    PT Start Time  1500    PT Stop Time  1524    PT Time Calculation (min)  24 min    Activity Tolerance  Patient tolerated treatment well    Behavior During Therapy  [facility] for tasks assessed/performed       Past Medical History:  Diagnosis Date  . Allergic rhinitis   . Allergy   . Diabetes mellitus without complication ([place])   . GERD (gastroesophageal reflux disease)   . Heart murmur   . 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 2 pap smears since then normal. Repeat pap in 3/07 at [facility],.  . HTN (hypertension)   . Hx of colonic polyp 03/12/2017   7 mm hyperplastic transverse recall 2023  . Hyperlipidemia   . Keratoconus    f/u @ [facility] eye center. Not needing corneal transplant.   . Osteoarthritis    Left hip  . Substance abuse ([place])    tobacco, alcohol  . Vaginal bleeding, abnormal June 2010   Thought to be 2/2 DUB vs endometrial CA. Gyn referral made at that time.    Past Surgical History:  Procedure Laterality Date  . TONSILLECTOMY      There were no vitals filed for this visit.  Subjective Assessment - 07/13/19 1505    Subjective  "I am doing pretty good, no issues. I had alittle pain last night but it went away"    Patient Stated Goals  relieve the pain, cant sleep on left side, getting up to stand  hurts    Currently in Pain?  No/denies         [facility] PT Assessment - 07/13/19 0001      Observation/Other Assessments   Focus on Therapeutic Outcomes (FOTO)    19% limited      Strength   Left Hip Flexion  5/5    Left Hip Extension  3/5      Transfers   Five time sit to stand comments   15                           PT Education - 07/13/19 1526    Education Details  reviwed all HEP and posture/ lfiting mechanics. added upgraded resistance bands and reviewed how to progrss strengthening appropriately to progress current level of function.    Person(s) Educated  Patient    Methods  Explanation;Verbal cues    Comprehension  Verbalized understanding;Verbal cues required       PT Short Term Goals - 07/13/19 1510      PT SHORT TERM GOAL #1   Title  independent with initial HEP    Period  Weeks    Status  Achieved      PT SHORT TERM GOAL #2   Title  Pt will be able to rise from seat without using momentum and in 22 sec or less  5 x STS to show increased LE strength    Period  Weeks    Status  Achieved        PT Long Term Goals - 07/13/19 1513      PT LONG TERM GOAL #1   Title  Pt will be independent with advanced HEP.    Period  Weeks      PT LONG TERM GOAL #2   Title  FOTO will improve from  39% limitation  to  27 % limitation   indicating improved functional mobility    Period  Weeks    Status  Achieved      PT LONG TERM GOAL #3   Title  Pain will decrease to 3/10 with all functional activities including stairs    Period  Weeks    Status  Achieved      PT LONG TERM GOAL #4   Title  Pt will rise from car after driving 30 minutes without exacerbating pain greater than 3/10    Period  Weeks    Status  Achieved      PT LONG TERM GOAL #5   Title  Pt will improve left hip extensor/flexor strength to >/= 4+/5 with </= 3/10 pain to promote safety with walking/standing activities    Baseline  limited hip extensor strength to 3/5 likely from limited hip ROM    Period  Weeks    Status  Partially Met            Plan - 07/13/19 1523    Clinical Impression Statement  Mrs Arizmendi has made great progress  with physical therapy increased strength / mobility and additionally reports no pain today. she was able to do all activities today and dicsused how to appropriately progress reps/ sets and resistance to promote strengthening. She met or partially met all goals today and is able to maintain and progress current level of function independently and will be discharged from PT today.    PT Treatment/Interventions  Cryotherapy;Electrical Stimulation;Iontophoresis 32m/ml Dexamethasone;Moist Heat;Ultrasound;Neuromuscular re-education;Therapeutic exercise;Therapeutic activities;Patient/family education;Functional mobility training;Stair training;Gait training    PT Next Visit Plan  D/C    PT Home Exercise Plan  sink squat, trunk rotation and left hip flexor squat, standing hip flexor stretching, standing hip abduction    Consulted and Agree with Plan of Care  Patient       Patient will benefit from skilled therapeutic intervention in order to improve the following deficits and impairments:  Decreased mobility, Decreased range of motion, Decreased strength, Increased muscle spasms, Increased fascial restricitons, Obesity, Pain  Visit Diagnosis: Pain in left hip  Muscle weakness (generalized)  Unsteadiness on feet     Problem List Patient Active Problem List   Diagnosis Date Noted  . 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 Need for immunization against influenza 06/29/2019  . Acute pain of left hip 05/11/2019  . Hx of colonic polyp 03/12/2017  . Type 2 diabetes mellitus ([place] 11/06/2016  . GERD (gastroesophageal reflux disease) 05/06/2016  . Healthcare maintenance 09/18/2015  . Overweight (BMI 25.0-29.9) 04/25/2014  . Allergic rhinitis 01/27/2012  . Preventative health care 01/27/2012  . Hyperlipidemia 10/19/2006  . Essential hypertension 10/19/2006  . Osteoarthritis 10/19/2006    LStarr Lake10/28/2020, 3:27 PM  [facility] [address]Phone: [phone]  Fax:  [phone] Name: LZAILYN THOENNESMRN: [id number] of Birth: [birth year]      PHYSICAL THERAPY DISCHARGE SUMMARY  Visits from Start of Care: 6  Current functional level related to goals / functional outcomes: See goals, FOTO 19% limite   Remaining deficits: Intermittent soreness in the hip which is fleeting in nature. Weakness in the hip extensors secondary to limited ROM.    Education / Equipment: HEP, theraband, posture, and lifting mecahnics  Plan: Patient agrees to discharge.  Patient goals were partially met. Patient is being discharged due to being pleased with the current functional level.  ?????        Bennette Hasty PT, DPT, LAT, ATC  07/13/19  3:29 PM

## 2019-07-18 ENCOUNTER — Ambulatory Visit: Payer: Self-pay | Admitting: Physical Therapy

## 2019-07-20 ENCOUNTER — Ambulatory Visit: Payer: Self-pay | Admitting: Physical Therapy

## 2019-08-02 ENCOUNTER — Other Ambulatory Visit: Payer: Self-pay

## 2019-08-02 ENCOUNTER — Encounter (HOSPITAL_COMMUNITY): Payer: Self-pay

## 2019-08-02 ENCOUNTER — Ambulatory Visit
Admission: RE | Admit: 2019-08-02 | Discharge: 2019-08-02 | Disposition: A | Discharge status: Home / Self Care | Payer: PRIVATE HEALTH INSURANCE | Source: Ambulatory Visit | Attending: Obstetrics and Gynecology | Admitting: Obstetrics and Gynecology

## 2019-08-02 ENCOUNTER — Ambulatory Visit (HOSPITAL_COMMUNITY)
Admission: RE | Admit: 2019-08-02 | Discharge: 2019-08-02 | Disposition: A | Discharge status: Home / Self Care | Payer: PRIVATE HEALTH INSURANCE | Source: Ambulatory Visit | Attending: Obstetrics and Gynecology | Admitting: Obstetrics and Gynecology

## 2019-08-02 DIAGNOSIS — Z1231 Encounter for screening mammogram for malignant neoplasm of breast: Secondary | ICD-10-CM

## 2019-08-02 DIAGNOSIS — Z1239 Encounter for other screening for malignant neoplasm of breast: Secondary | ICD-10-CM | POA: Insufficient documentation

## 2019-08-02 NOTE — Patient Instructions (Signed)
Explained breast self awareness with SAYRA FOCO. Patient did not need a Pap smear today due to last Pap smear was 02/23/2017. Let her know [facility]CP will cover Pap smears every 3 years unless has a history of abnormal Pap smears. Referred patient to the [facility] for a screening mammogram. Appointment scheduled for Tuesday, August 02, 2019 at 1600. Patient aware of appointment and will be there. Let patient know the [facility] will follow up with her within the next couple weeks with results of her mammogram by letter or phone. Diara COVE IGLEHEART verbalized understanding.  [place], [name], RN 8:20 PM

## 2019-08-02 NOTE — Progress Notes (Signed)
No complaints today.   Pap Smear: Pap smear not completed today. Last Pap smear was 02/23/2017 at the free cervical cancer screening at [facility] and normal. Per patient has a history of an abnormal Pap smear when she was around 58 years old that cryotherapy was completed for follow up. Per patient all Pap smears have been normal since cryotherapy. Last Pap smear result is in EPIC. Marland Kitchen  Physical exam: Breasts Breasts symmetrical. No skin abnormalities bilateral breasts. No nipple retraction bilateral breasts. No nipple discharge bilateral breasts. No lymphadenopathy. No lumps palpated bilateral breasts. No complaints of pain or tenderness on exam. Referred patient to the [facility] for a screening mammogram. Appointment scheduled for Tuesday, August 02, 2019 at 1600.        Pelvic/Bimanual No Pap smear completed today since last Pap smear was 02/23/2017. Pap smear not indicated per BCCCP guidelines.   Smoking History: Patient is a former smoker that quit 03/29/2013.  Patient Navigation: Patient education provided. Access to services provided for patient through [facility] program.   Colorectal Cancer Screening: Patient had a colonoscopy completed6/22/2018. Patient completed a FIT Test 03/22/2015 that was negative. No complaints today.   Breast and Cervical Cancer Risk Assessment: Patient has a family history of a maternal aunt and three cousins having breast cancer. Patient has no known genetic mutations or history of radiation treatment to the chest before age 9. Per patient has a history of cervical dysplasia. Patient has no history of being immunocompromised or DES exposure in-utero.  Risk Assessment    Risk Scores      08/02/2019 06/29/2018   Last edited by: Loletta Parish, RN Armond Hang, LPN   5-year risk: 1.1 % 1.1 %   Lifetime risk: 5.3 % 5.5 %

## 2019-08-17 ENCOUNTER — Ambulatory Visit: Payer: Self-pay

## 2019-09-13 ENCOUNTER — Telehealth: Payer: Self-pay | Admitting: Internal Medicine

## 2019-09-13 NOTE — Telephone Encounter (Signed)
Pt is wanted referral to go to [facility] dental clinic; pls contact pt [phone]

## 2019-10-04 ENCOUNTER — Ambulatory Visit: Payer: PRIVATE HEALTH INSURANCE

## 2019-10-18 ENCOUNTER — Other Ambulatory Visit: Payer: Self-pay

## 2019-10-18 ENCOUNTER — Encounter: Payer: Self-pay | Admitting: Internal Medicine

## 2019-10-18 ENCOUNTER — Ambulatory Visit: Payer: Self-pay | Admitting: Internal Medicine

## 2019-10-18 VITALS — BP 142/85 | HR 104 | Temp 98.1°F | Ht 69.0 in | Wt 198.9 lb

## 2019-10-18 DIAGNOSIS — R011 Cardiac murmur, unspecified: Secondary | ICD-10-CM

## 2019-10-18 DIAGNOSIS — K0889 Other specified disorders of teeth and supporting structures: Secondary | ICD-10-CM

## 2019-10-18 NOTE — Progress Notes (Signed)
   CC: Toothache   HPI:  [name] is a 59 y.o. very pleasant African-American woman with medical history listed below presented for evaluation of toothache.  Please see problem based charting for further details.  Past Medical History:  Diagnosis Date  . Allergic rhinitis   . Allergy   . Diabetes mellitus without complication ([place])   . GERD (gastroesophageal reflux disease)   . Heart murmur   . 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 2 pap smears since then normal. Repeat pap in 3/07 at [facility],.  . HTN (hypertension)   . Hx of colonic polyp 03/12/2017   7 mm hyperplastic transverse recall 2023  . Hyperlipidemia   . Keratoconus    f/u @ [facility] eye center. Not needing corneal transplant.   . Osteoarthritis    Left hip  . Substance abuse ([place])    tobacco, alcohol  . Vaginal bleeding, abnormal June 2010   Thought to be 2/2 DUB vs endometrial CA. Gyn referral made at that time.   Review of Systems:  As per HPI  Physical Exam:  Vitals:   10/18/19 1522  BP: (!) 142/85  Pulse: (!) 104  Temp: 98.1 F (36.7 C)  TempSrc: Oral  SpO2: 99%  Weight: 198 lb 14.4 oz (90.2 kg)  Height: 5\' 9"  (1.753 m)   Physical Exam  HENT:  Mouth/Throat: Oropharynx is clear and moist and mucous membranes are normal. She does not have dentures. No oral lesions. Normal dentition. No dental abscesses, lacerations or dental caries. No oropharyngeal exudate or posterior oropharyngeal edema.    Cardiovascular: Regular rhythm. Tachycardia present.  Murmur heard.  Systolic (2nd Left intercoastal space) murmur is present.   Assessment & Plan:   See Encounters Tab for problem based charting.  Patient discussed with Dr. Heber Smithton

## 2019-10-18 NOTE — Patient Instructions (Signed)
[name],   Thanks for seeing [place] today. I have placed a referral to the dentists.   Take Care.   Dr. Eileen Stanford  Please call the internal medicine center clinic if you have any questions or concerns, we may be able to help and keep you from a long and expensive emergency room wait. Our clinic and after hours phone number is [phone], the best time to call is Monday through Friday 9 am to 4 pm but there is always someone available 24/7 if you have an emergency. If you need medication refills please notify your pharmacy one week in advance and they will send [place] a request.

## 2019-10-18 NOTE — Assessment & Plan Note (Signed)
Tooth ache: She states that for about a month now she has been experiencing pain in her gums specifically located at the 2 outward bottom teeth.  She also reports that her teeth is loose.  She has had prior dental extraction in the past about 7 years ago.  She denies fevers, chills, dental abscess, bleeding gums.  Plan: -Referral to dentistry placed

## 2019-10-23 NOTE — Progress Notes (Signed)
Internal Medicine Clinic Attending  Case discussed with Dr. Agyei at the time of the visit.  We reviewed the resident's history and exam and pertinent patient test results.  I agree with the assessment, diagnosis, and plan of care documented in the resident's note.    

## 2019-10-24 ENCOUNTER — Other Ambulatory Visit: Payer: Self-pay | Admitting: Internal Medicine

## 2019-10-24 DIAGNOSIS — I10 Essential (primary) hypertension: Secondary | ICD-10-CM

## 2020-01-05 ENCOUNTER — Other Ambulatory Visit: Payer: Self-pay | Admitting: Internal Medicine

## 2020-01-05 DIAGNOSIS — I10 Essential (primary) hypertension: Secondary | ICD-10-CM

## 2020-02-22 ENCOUNTER — Ambulatory Visit: Payer: PRIVATE HEALTH INSURANCE

## 2020-02-27 ENCOUNTER — Other Ambulatory Visit: Payer: Self-pay | Admitting: Internal Medicine

## 2020-02-27 DIAGNOSIS — I10 Essential (primary) hypertension: Secondary | ICD-10-CM

## 2020-03-06 ENCOUNTER — Encounter: Payer: Self-pay | Admitting: Internal Medicine

## 2020-03-08 ENCOUNTER — Other Ambulatory Visit: Payer: Self-pay | Admitting: Internal Medicine

## 2020-03-08 DIAGNOSIS — I10 Essential (primary) hypertension: Secondary | ICD-10-CM

## 2020-03-09 ENCOUNTER — Other Ambulatory Visit: Payer: Self-pay | Admitting: Internal Medicine

## 2020-03-09 DIAGNOSIS — E785 Hyperlipidemia, unspecified: Secondary | ICD-10-CM

## 2020-03-16 ENCOUNTER — Other Ambulatory Visit (HOSPITAL_COMMUNITY)
Admission: RE | Admit: 2020-03-16 | Discharge: 2020-03-16 | Disposition: A | Discharge status: Home / Self Care | Payer: PRIVATE HEALTH INSURANCE | Source: Ambulatory Visit | Attending: Internal Medicine | Admitting: Internal Medicine

## 2020-03-16 ENCOUNTER — Ambulatory Visit: Payer: Self-pay | Admitting: Internal Medicine

## 2020-03-16 ENCOUNTER — Encounter: Payer: Self-pay | Admitting: Internal Medicine

## 2020-03-16 VITALS — BP 120/66 | HR 63 | Temp 97.9°F | Ht 69.0 in | Wt 194.2 lb

## 2020-03-16 DIAGNOSIS — Z124 Encounter for screening for malignant neoplasm of cervix: Secondary | ICD-10-CM

## 2020-03-16 DIAGNOSIS — E119 Type 2 diabetes mellitus without complications: Secondary | ICD-10-CM

## 2020-03-16 DIAGNOSIS — I10 Essential (primary) hypertension: Secondary | ICD-10-CM

## 2020-03-16 LAB — POCT GLYCOSYLATED HEMOGLOBIN (HGB A1C): Hemoglobin A1C: 6 % — AB (ref 4.0–5.6)

## 2020-03-16 LAB — GLUCOSE, CAPILLARY: Glucose-Capillary: 94 mg/dL (ref 70–99)

## 2020-03-16 NOTE — Assessment & Plan Note (Signed)
BP Readings from Last 3 Encounters:  03/16/20 120/66  10/18/19 (!) 142/85  08/02/19 (!) 146/78   Bp at goal this visit. Denies any headaches, blurry vision, edema. Currently on amlodipine, triamterene-hctz and coreg. Not on Ace-I or ARB. With history of DM, can consider adjustment for renal-protective effect. Will f/u albuminuria screen.  - C/w current regimen for now

## 2020-03-16 NOTE — Progress Notes (Signed)
Unable to obtain adequate retinal image of right eye for submission to ophthalmology. Patient reports she used to see [facility] and is need of cornea transplants, but is uninsured and finds the cost prohibitory. She completed the [facility] referral while here in the office and was eligible for the glaucoma appointment. She agreed to ask them to fax [place] the results.  Debera Lat, RD 03/16/2020 12:14 PM.

## 2020-03-16 NOTE — Assessment & Plan Note (Signed)
Cassandra Park is a 59 yo F w/ PMH of HLD, HTN, DJD, T2Dm presenting to [facility] for management of her chronic conditions. She mentions that she is overdue for pap smear. Denies any abnormal vaginal bleeding or discharge. Previous tests have always been negative.  - Pap smear performed in office today - Cytology w/ co-HPV testing sent

## 2020-03-16 NOTE — Assessment & Plan Note (Addendum)
Lab Results  Component Value Date   HGBA1C 6.0 (A) 03/16/2020   Presents for continued management of her diabetes. Does not check sugars at home but has been in pre-diabetic range for 2 years. Discussed need for microalbuminuria screen for mentions that she just used the bathroom and does not think she can provide urine sample. Currently on metformin 500mg  BID. Mentions that she would like to see if her blood sugar will remain stable off metformin. Discussed risks of uncontrolled diabetes and need for increased surveillance if she would like to manage with lifestyle modifications alone. Cassandra Park expressed understanding.  - Encouraged to continue lifestyle modifications - D/c metformin - Proteinuria screen next visit  - F/u in 3 months - In-clinic diabetic retinopathy screen today

## 2020-03-16 NOTE — Patient Instructions (Addendum)
Thank you for allowing [place] to provide your care today. Today we discussed your blood sugar and blood pressure    I have ordered eye exam labs for you. I will call if any are abnormal.    Today we made the following changes to your medications.    Please stop metformin  Please follow-up in 3 months.    Should you have any questions or concerns please call the internal medicine clinic at [phone].

## 2020-03-16 NOTE — Progress Notes (Signed)
CC: Diabetes  HPI: [name] is a 59 y.o. with PMH listed below presenting with complaint of diabetes. Please see problem based assessment and plan for further details.  Past Medical History:  Diagnosis Date  . Allergic rhinitis   . Allergy   . Diabetes mellitus without complication ([place])   . GERD (gastroesophageal reflux disease)   . Heart murmur   . 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 2 pap smears since then normal. Repeat pap in 3/07 at [facility],.  . HTN (hypertension)   . Hx of colonic polyp 03/12/2017   7 mm hyperplastic transverse recall 2023  . Hyperlipidemia   . Keratoconus    f/u @ [facility] eye center. Not needing corneal transplant.   . Osteoarthritis    Left hip  . Substance abuse ([place])    tobacco, alcohol  . Vaginal bleeding, abnormal June 2010   Thought to be 2/2 DUB vs endometrial CA. Gyn referral made at that time.    Review of Systems: Review of Systems  Constitutional: Negative for chills, fever and malaise/fatigue.  Eyes: Negative for blurred vision.  Respiratory: Negative for shortness of breath.   Cardiovascular: Negative for chest pain, palpitations and leg swelling.  Gastrointestinal: Negative for constipation, diarrhea, nausea and vomiting.  Genitourinary: Negative for frequency and urgency.  All other systems reviewed and are negative.   Physical Exam: Vitals:   03/16/20 1025 03/16/20 1047  BP: 135/63 120/66  Pulse: 63   Temp: 97.9 F (36.6 C)   TempSrc: Oral   SpO2: 99%   Weight: 194 lb 3.2 oz (88.1 kg)   Height: 5\' 9"  (1.753 m)    Physical Exam Constitutional:      General: She is not in acute distress.    Appearance: She is normal weight.  HENT:     Head: Normocephalic and atraumatic.     Mouth/Throat:     Mouth: Mucous membranes are moist.     Pharynx: Oropharynx is clear.  Eyes:     Comments: L eye w/ clouded cornea  Cardiovascular:     Rate and Rhythm: Normal rate and regular rhythm.     Pulses:  Normal pulses.     Heart sounds: Normal heart sounds. No murmur heard.   Pulmonary:     Effort: Pulmonary effort is normal.     Breath sounds: Normal breath sounds. No wheezing or rales.  Abdominal:     General: Abdomen is flat. Bowel sounds are normal. There is no distension.     Palpations: Abdomen is soft.     Tenderness: There is no abdominal tenderness.  Genitourinary:    General: Normal vulva.     Vagina: No vaginal discharge.     Comments: Normal cervix Musculoskeletal:        General: Normal range of motion.  Skin:    General: Skin is warm and dry.  Neurological:     General: No focal deficit present.     Mental Status: She is alert and oriented to person, place, and time.    Assessment & Plan:   Type 2 diabetes mellitus ([place]) Lab Results  Component Value Date   HGBA1C 6.0 (A) 03/16/2020   Presents for continued management of her diabetes. Does not check sugars at home but has been in pre-diabetic range for 2 years. Discussed need for microalbuminuria screen for mentions that she just used the bathroom and does not think she can provide urine sample.  Currently on metformin 500mg  BID. Mentions that she would like to see if her blood sugar will remain stable off metformin. Discussed risks of uncontrolled diabetes and need for increased surveillance if she would like to manage with lifestyle modifications alone. Cassandra Park expressed understanding.  - Encouraged to continue lifestyle modifications - D/c metformin - Proteinuria screen next visit  - F/u in 3 months - In-clinic diabetic retinopathy screen today  Pap smear for cervical cancer screening Cassandra Park is a 59 yo F w/ PMH of HLD, HTN, DJD, T2Dm presenting to [facility] for management of her chronic conditions. She mentions that she is overdue for pap smear. Denies any abnormal vaginal bleeding or discharge. Previous tests have always been negative.  - Pap smear performed in office today - Cytology w/ co-HPV testing  sent  Essential hypertension BP Readings from Last 3 Encounters:  03/16/20 120/66  10/18/19 (!) 142/85  08/02/19 (!) 146/78   Bp at goal this visit. Denies any headaches, blurry vision, edema. Currently on amlodipine, triamterene-hctz and coreg. Not on Ace-I or ARB. With history of DM, can consider adjustment for renal-protective effect. Will f/u albuminuria screen.  - C/w current regimen for now    Patient discussed with Cassandra Park  -Gilberto Better, [facility] Pager: [phone]

## 2020-03-20 LAB — CYTOLOGY - PAP
Comment: NEGATIVE
Diagnosis: NEGATIVE
High risk HPV: NEGATIVE

## 2020-03-20 NOTE — Progress Notes (Signed)
Internal Medicine Clinic Attending  Case discussed with Dr. Lee  At the time of the visit.  We reviewed the resident's history and exam and pertinent patient test results.  I agree with the assessment, diagnosis, and plan of care documented in the resident's note.    

## 2020-03-21 ENCOUNTER — Telehealth: Payer: Self-pay | Admitting: Internal Medicine

## 2020-03-21 NOTE — Telephone Encounter (Signed)
Spoke with Cassandra Park on her home phone and informed her of negative pap smear results

## 2020-04-10 ENCOUNTER — Other Ambulatory Visit: Payer: Self-pay | Admitting: *Deleted

## 2020-04-10 DIAGNOSIS — I10 Essential (primary) hypertension: Secondary | ICD-10-CM

## 2020-04-10 MED ORDER — CARVEDILOL 6.25 MG PO TABS: ORAL_TABLET | ORAL | 0 refills | Status: DC

## 2020-04-10 MED ORDER — AMLODIPINE BESYLATE 10 MG PO TABS: 10.0000 mg | ORAL_TABLET | Freq: Every day | ORAL | 3 refills | Status: DC

## 2020-04-11 ENCOUNTER — Telehealth: Payer: Self-pay | Admitting: *Deleted

## 2020-04-11 NOTE — Telephone Encounter (Signed)
Call from [place], [place] - stated their electronic system is down; calling for refills on carvedilol and amlodipine. They were refilled yesterday per Dr Truman Hayward - verbal order given as prescribed.

## 2020-05-01 ENCOUNTER — Other Ambulatory Visit: Payer: Self-pay

## 2020-05-01 DIAGNOSIS — Z1231 Encounter for screening mammogram for malignant neoplasm of breast: Secondary | ICD-10-CM

## 2020-05-30 IMAGING — MG DIGITAL SCREENING BILAT W/ TOMO W/ CAD
8 series · 8 of 24 positions shown · non-contrast
Comparison: Previous exam(s).

CLINICAL DATA: Screening.

EXAM:
DIGITAL SCREENING BILATERAL MAMMOGRAM WITH TOMO AND CAD

[L CC synth-2D]
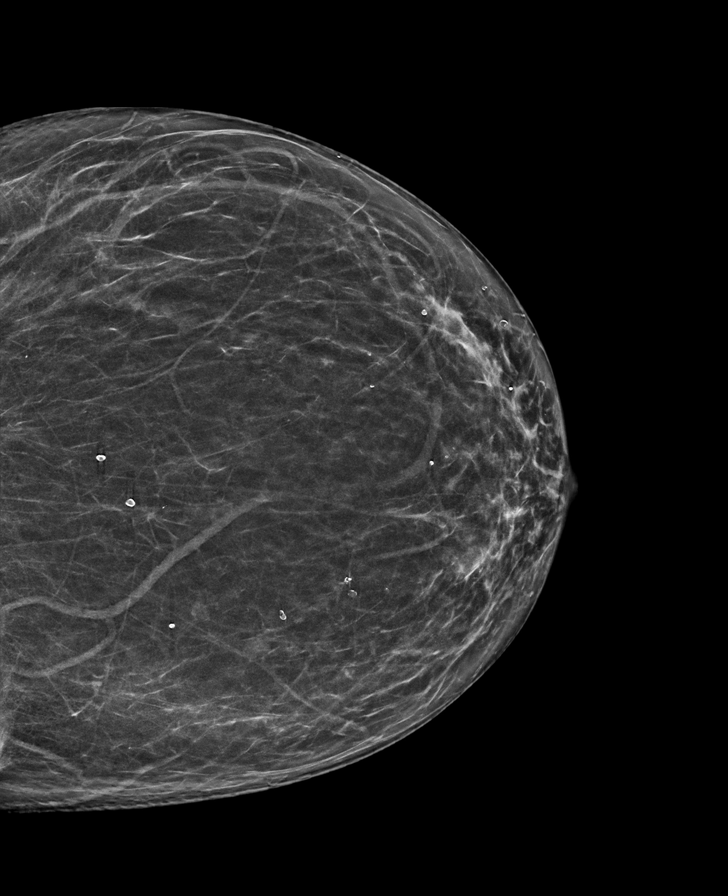

[L MLO synth-2D]
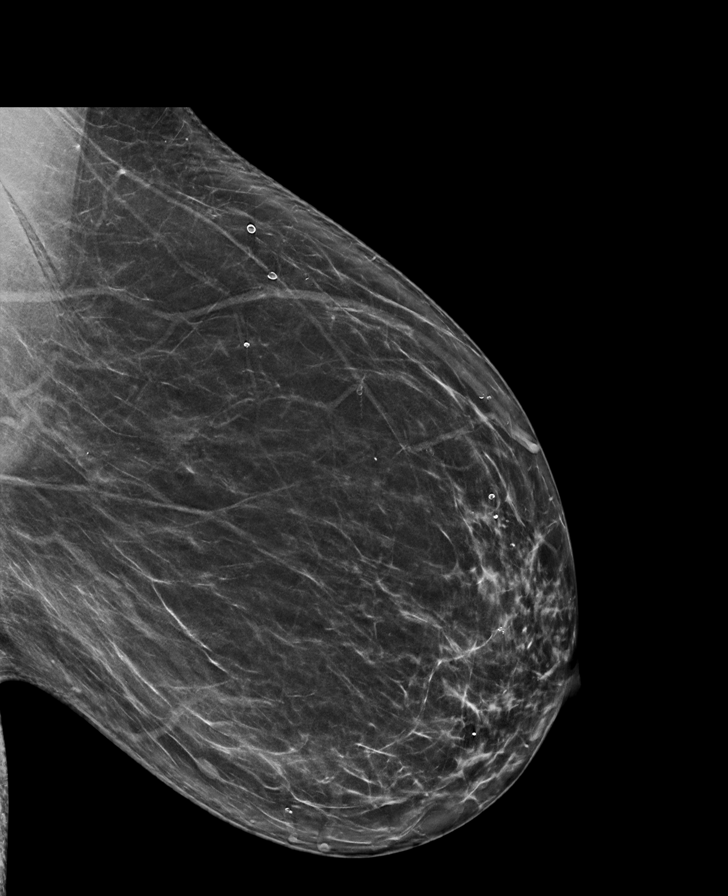

[R CC synth-2D]
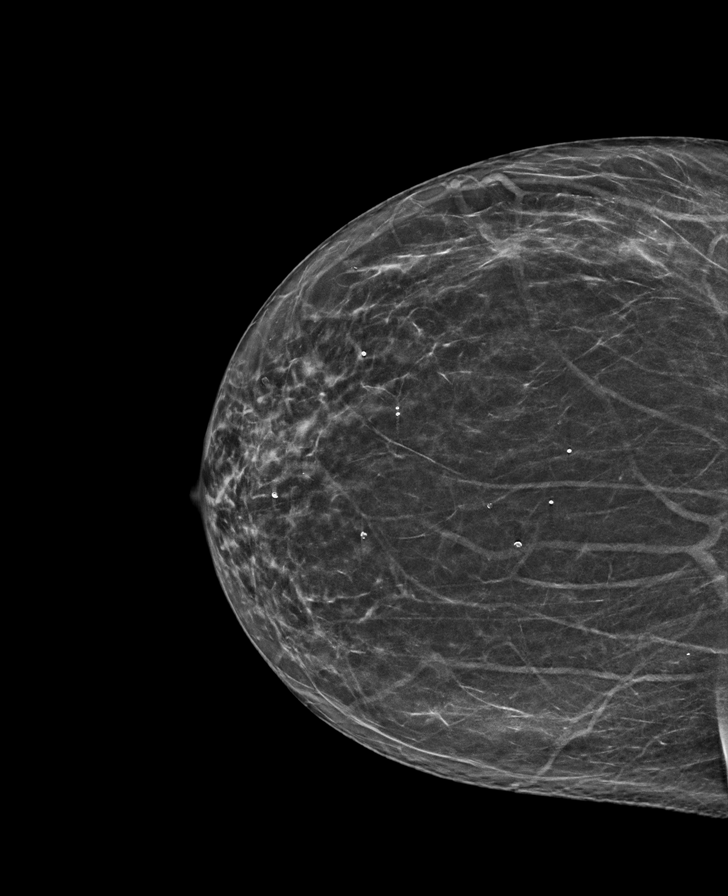

[R MLO synth-2D]
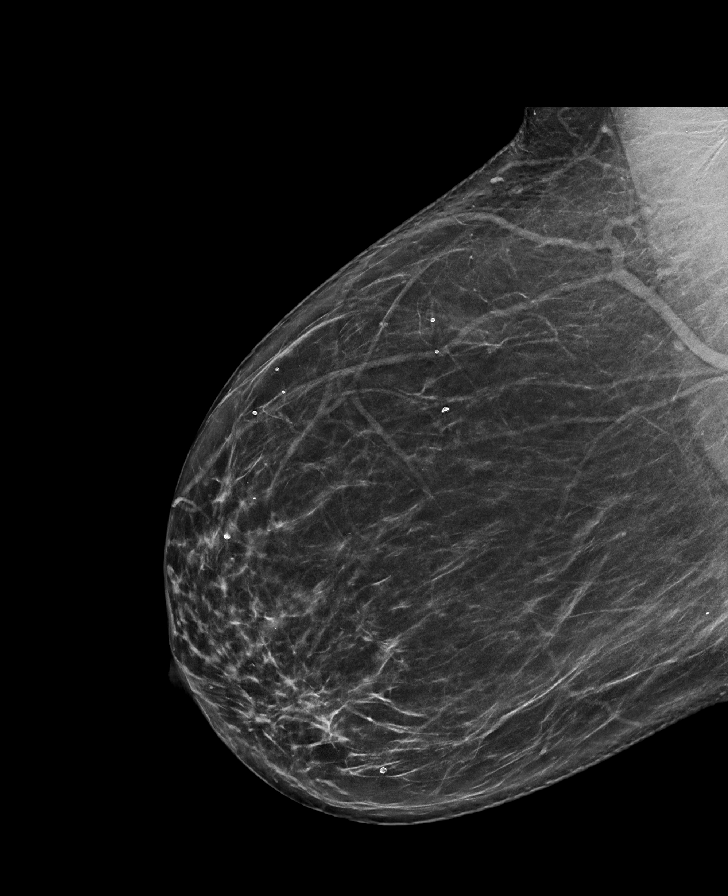

[R MLO tomo · tomo slice 40/79.0]
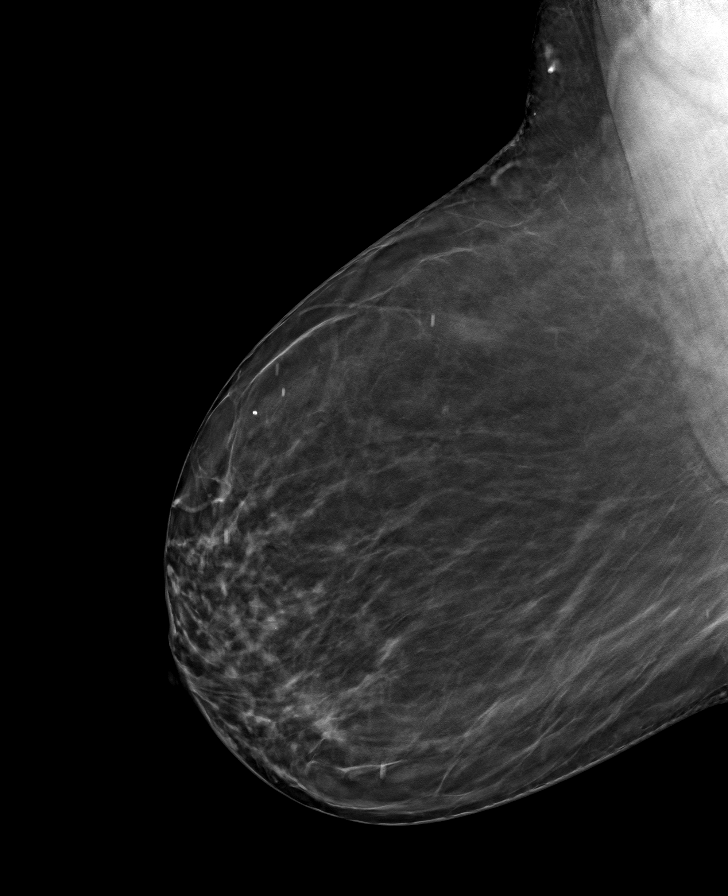

[R CC tomo · tomo slice 34/67.0]
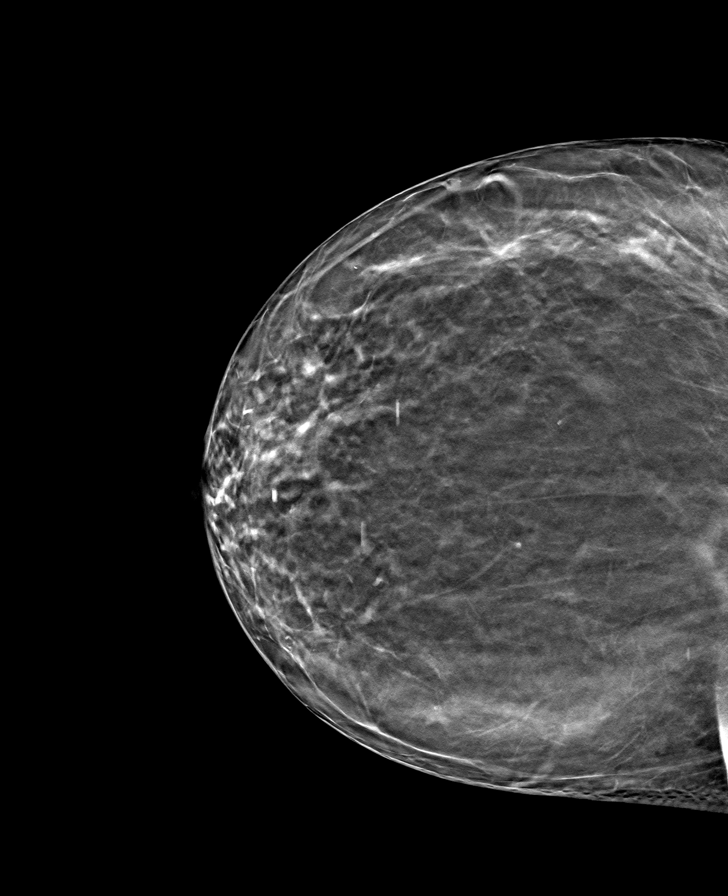

[L CC tomo · tomo slice 33/66.0]
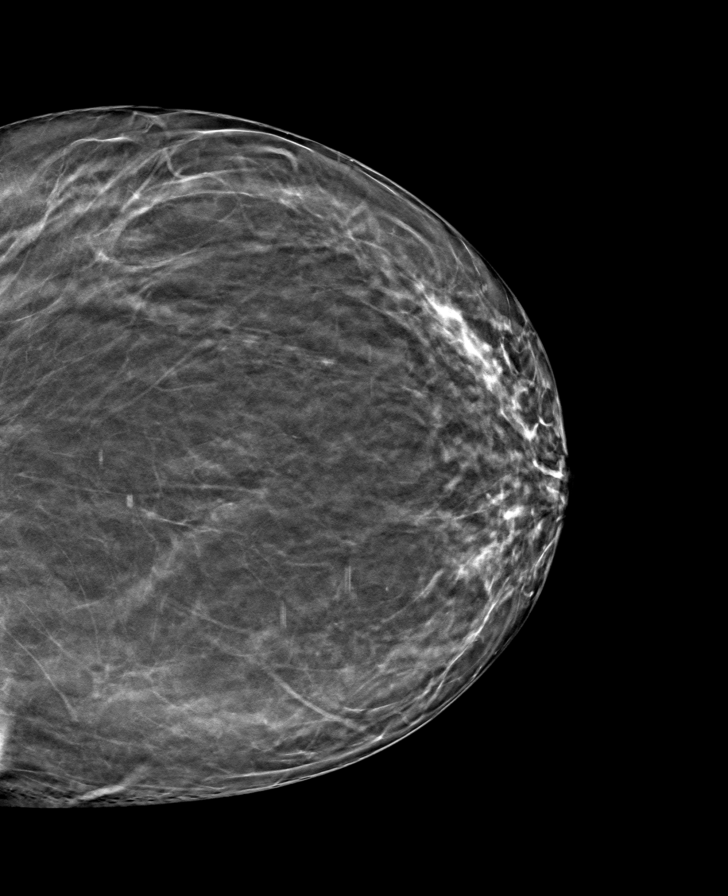

[L MLO tomo · tomo slice 40/79.0]
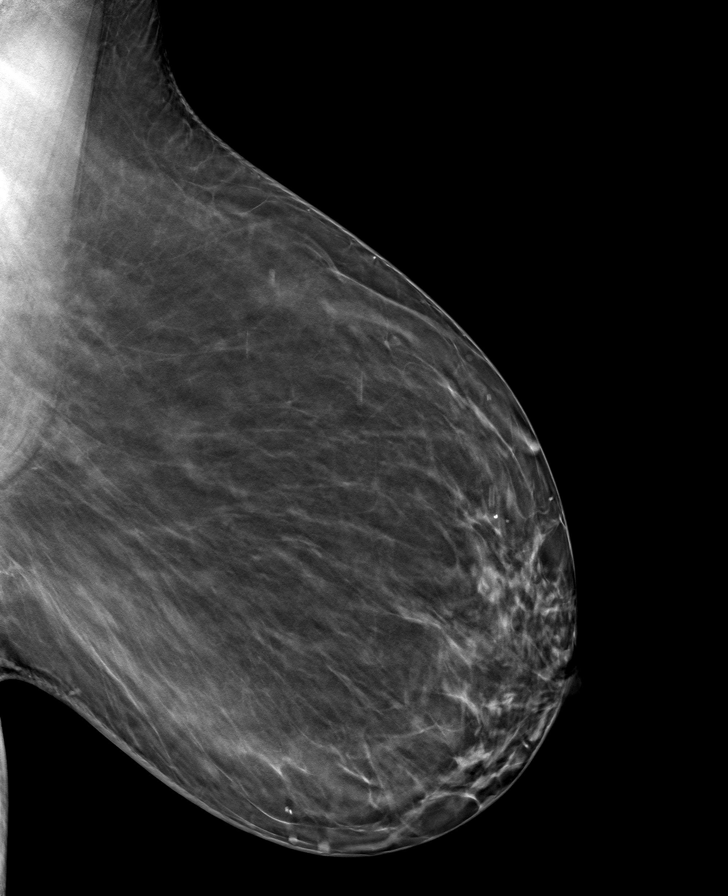

[8 of 24 positions shown; findings below may reference images not displayed]

ACR Breast Density Category b: There are scattered areas of
fibroglandular density.
FINDINGS: There are no findings suspicious for malignancy. Images were
processed with CAD.
IMPRESSION: No mammographic evidence of malignancy. A result letter of this
screening mammogram will be mailed directly to the patient.

RECOMMENDATION:
Screening mammogram in one year. (Code:[id number])

BI-RADS CATEGORY  1: Negative.

## 2020-06-12 ENCOUNTER — Other Ambulatory Visit: Payer: Self-pay | Admitting: Internal Medicine

## 2020-06-12 DIAGNOSIS — I10 Essential (primary) hypertension: Secondary | ICD-10-CM

## 2020-06-19 ENCOUNTER — Encounter: Payer: PRIVATE HEALTH INSURANCE | Admitting: Internal Medicine

## 2020-06-20 ENCOUNTER — Encounter: Payer: Self-pay | Admitting: Internal Medicine

## 2020-06-20 ENCOUNTER — Ambulatory Visit (INDEPENDENT_AMBULATORY_CARE_PROVIDER_SITE_OTHER): Payer: Self-pay | Admitting: Internal Medicine

## 2020-06-20 VITALS — BP 124/68 | HR 81 | Temp 97.9°F | Ht 69.0 in | Wt 197.2 lb

## 2020-06-20 DIAGNOSIS — I10 Essential (primary) hypertension: Secondary | ICD-10-CM

## 2020-06-20 DIAGNOSIS — E785 Hyperlipidemia, unspecified: Secondary | ICD-10-CM

## 2020-06-20 DIAGNOSIS — E119 Type 2 diabetes mellitus without complications: Secondary | ICD-10-CM

## 2020-06-20 LAB — POCT GLYCOSYLATED HEMOGLOBIN (HGB A1C): Hemoglobin A1C: 6.4 % — AB (ref 4.0–5.6)

## 2020-06-20 LAB — GLUCOSE, CAPILLARY: Glucose-Capillary: 119 mg/dL — ABNORMAL HIGH (ref 70–99)

## 2020-06-20 MED ORDER — LOVASTATIN 20 MG PO TABS: 20.0000 mg | ORAL_TABLET | Freq: Every day | ORAL | 3 refills | Status: DC

## 2020-06-20 MED ORDER — CARVEDILOL 6.25 MG PO TABS: 6.2500 mg | ORAL_TABLET | Freq: Two times a day (BID) | ORAL | 3 refills | Status: DC

## 2020-06-20 MED ORDER — AMLODIPINE BESYLATE 10 MG PO TABS: 10.0000 mg | ORAL_TABLET | Freq: Every day | ORAL | 3 refills | Status: DC

## 2020-06-20 MED ORDER — TRIAMTERENE-HCTZ 37.5-25 MG PO TABS: 1.0000 | ORAL_TABLET | Freq: Every day | ORAL | 3 refills | Status: DC

## 2020-06-20 NOTE — Patient Instructions (Addendum)
Thank you for allowing [place] to provide your care today. Today we discussed your diabetes and blood pressure    I have ordered bmp, lipid profile, urine labs for you. I will call if any are abnormal.    Today we made no changes to your medications, but I will call you if your labs show we need to change any of your medicine.  Please follow-up in 3 months.    Should you have any questions or concerns please call the internal medicine clinic at [phone].    DASH Eating Plan DASH stands for "Dietary Approaches to Stop Hypertension." The DASH eating plan is a healthy eating plan that has been shown to reduce high blood pressure (hypertension). It may also reduce your risk for type 2 diabetes, heart disease, and stroke. The DASH eating plan may also help with weight loss. What are tips for following this plan?  General guidelines  Avoid eating more than 2,300 mg (milligrams) of salt (sodium) a day. If you have hypertension, you may need to reduce your sodium intake to 1,500 mg a day.  Limit alcohol intake to no more than 1 drink a day for nonpregnant women and 2 drinks a day for men. One drink equals 12 oz of beer, 5 oz of wine, or 1 oz of hard liquor.  Work with your health care provider to maintain a healthy body weight or to lose weight. Ask what an ideal weight is for you.  Get at least 30 minutes of exercise that causes your heart to beat faster (aerobic exercise) most days of the week. Activities may include walking, swimming, or biking.  Work with your health care provider or diet and nutrition specialist (dietitian) to adjust your eating plan to your individual calorie needs. Reading food labels   Check food labels for the amount of sodium per serving. Choose foods with less than 5 percent of the Daily Value of sodium. Generally, foods with less than 300 mg of sodium per serving fit into this eating plan.  To find whole grains, look for the word "whole" as the first word in the  ingredient list. Shopping  Buy products labeled as "low-sodium" or "no salt added."  Buy fresh foods. Avoid canned foods and premade or frozen meals. Cooking  Avoid adding salt when cooking. Use salt-free seasonings or herbs instead of table salt or sea salt. Check with your health care provider or pharmacist before using salt substitutes.  Do not fry foods. Cook foods using healthy methods such as baking, boiling, grilling, and broiling instead.  Cook with heart-healthy oils, such as olive, canola, soybean, or sunflower oil. Meal planning  Eat a balanced diet that includes: ? 5 or more servings of fruits and vegetables each day. At each meal, try to fill half of your plate with fruits and vegetables. ? Up to 6-8 servings of whole grains each day. ? Less than 6 oz of lean meat, poultry, or fish each day. A 3-oz serving of meat is about the same size as a deck of cards. One egg equals 1 oz. ? 2 servings of low-fat dairy each day. ? A serving of nuts, seeds, or beans 5 times each week. ? Heart-healthy fats. Healthy fats called Omega-3 fatty acids are found in foods such as flaxseeds and coldwater fish, like sardines, salmon, and mackerel.  Limit how much you eat of the following: ? Canned or prepackaged foods. ? Food that is high in trans fat, such as fried foods. ? Food  that is high in saturated fat, such as fatty meat. ? Sweets, desserts, sugary drinks, and other foods with added sugar. ? Full-fat dairy products.  Do not salt foods before eating.  Try to eat at least 2 vegetarian meals each week.  Eat more home-cooked food and less restaurant, buffet, and fast food.  When eating at a restaurant, ask that your food be prepared with less salt or no salt, if possible. What foods are recommended? The items listed may not be a complete list. Talk with your dietitian about what dietary choices are best for you. Grains Whole-grain or whole-wheat bread. Whole-grain or whole-wheat  pasta. Brown rice. [place]. Bulgur. Whole-grain and low-sodium cereals. Pita bread. Low-fat, low-sodium crackers. Whole-wheat flour tortillas. Vegetables Fresh or frozen vegetables (raw, steamed, roasted, or grilled). Low-sodium or reduced-sodium tomato and vegetable juice. Low-sodium or reduced-sodium tomato sauce and tomato paste. Low-sodium or reduced-sodium canned vegetables. Fruits All fresh, dried, or frozen fruit. Canned fruit in natural juice (without added sugar). Meat and other protein foods Skinless chicken or [place]. Ground chicken or [place]. Pork with fat trimmed off. Fish and seafood. Egg whites. Dried beans, peas, or lentils. Unsalted nuts, nut butters, and seeds. Unsalted canned beans. Lean cuts of beef with fat trimmed off. Low-sodium, lean deli meat. Dairy Low-fat (1%) or fat-free (skim) milk. Fat-free, low-fat, or reduced-fat cheeses. Nonfat, low-sodium ricotta or cottage cheese. Low-fat or nonfat yogurt. Low-fat, low-sodium cheese. Fats and oils Soft margarine without trans fats. Vegetable oil. Low-fat, reduced-fat, or light mayonnaise and salad dressings (reduced-sodium). Canola, safflower, olive, soybean, and sunflower oils. Avocado. Seasoning and other foods Herbs. Spices. Seasoning mixes without salt. Unsalted popcorn and pretzels. Fat-free sweets. What foods are not recommended? The items listed may not be a complete list. Talk with your dietitian about what dietary choices are best for you. Grains Baked goods made with fat, such as croissants, muffins, or some breads. Dry pasta or rice meal packs. Vegetables Creamed or fried vegetables. Vegetables in a cheese sauce. Regular canned vegetables (not low-sodium or reduced-sodium). Regular canned tomato sauce and paste (not low-sodium or reduced-sodium). Regular tomato and vegetable juice (not low-sodium or reduced-sodium). Angie Fava. Olives. Fruits Canned fruit in a light or heavy syrup. Fried fruit. Fruit in cream or  butter sauce. Meat and other protein foods Fatty cuts of meat. Ribs. Fried meat. Berniece Salines. Sausage. Bologna and other processed lunch meats. Salami. Fatback. Hotdogs. Bratwurst. Salted nuts and seeds. Canned beans with added salt. Canned or smoked fish. Whole eggs or egg yolks. Chicken or [place] with skin. Dairy Whole or 2% milk, cream, and half-and-half. Whole or full-fat cream cheese. Whole-fat or sweetened yogurt. Full-fat cheese. Nondairy creamers. Whipped toppings. Processed cheese and cheese spreads. Fats and oils Butter. Stick margarine. Lard. Shortening. Ghee. Bacon fat. Tropical oils, such as coconut, palm kernel, or palm oil. Seasoning and other foods Salted popcorn and pretzels. Onion salt, garlic salt, seasoned salt, table salt, and sea salt. Worcestershire sauce. Tartar sauce. Barbecue sauce. Teriyaki sauce. Soy sauce, including reduced-sodium. Steak sauce. Canned and packaged gravies. Fish sauce. Oyster sauce. Cocktail sauce. Horseradish that you find on the shelf. Ketchup. Mustard. Meat flavorings and tenderizers. Bouillon cubes. Hot sauce and Tabasco sauce. Premade or packaged marinades. Premade or packaged taco seasonings. Relishes. Regular salad dressings. Where to find more information:  National Heart, Lung, and [place]: https://wilson-eaton.com/  American Heart Association: www.heart.org Summary  The DASH eating plan is a healthy eating plan that has been shown to reduce high blood pressure (  hypertension). It may also reduce your risk for type 2 diabetes, heart disease, and stroke.  With the DASH eating plan, you should limit salt (sodium) intake to 2,300 mg a day. If you have hypertension, you may need to reduce your sodium intake to 1,500 mg a day.  When on the DASH eating plan, aim to eat more fresh fruits and vegetables, whole grains, lean proteins, low-fat dairy, and heart-healthy fats.  Work with your health care provider or diet and nutrition specialist (dietitian) to  adjust your eating plan to your individual calorie needs. This information is not intended to replace advice given to you by your health care provider. Make sure you discuss any questions you have with your health care provider. Document Revised: 08/14/2017 Document Reviewed: 08/25/2016 Elsevier Patient Education  2020 [employer].

## 2020-06-21 ENCOUNTER — Other Ambulatory Visit: Payer: Self-pay

## 2020-06-21 ENCOUNTER — Encounter: Payer: Self-pay | Admitting: Internal Medicine

## 2020-06-21 DIAGNOSIS — I10 Essential (primary) hypertension: Secondary | ICD-10-CM

## 2020-06-21 DIAGNOSIS — E119 Type 2 diabetes mellitus without complications: Secondary | ICD-10-CM

## 2020-06-21 DIAGNOSIS — E785 Hyperlipidemia, unspecified: Secondary | ICD-10-CM

## 2020-06-21 LAB — BMP8+ANION GAP
Anion Gap: 17 mmol/L (ref 10.0–18.0)
BUN/Creatinine Ratio: 11 — ABNORMAL LOW (ref 12–28)
BUN: 10 mg/dL (ref 8–27)
CO2: 24 mmol/L (ref 20–29)
Calcium: 9.6 mg/dL (ref 8.7–10.3)
Chloride: 100 mmol/L (ref 96–106)
Creatinine, Ser: 0.87 mg/dL (ref 0.57–1.00)
GFR calc Af Amer: 83 mL/min/{1.73_m2} (ref >59)
GFR calc non Af Amer: 72 mL/min/{1.73_m2} (ref >59)
Glucose: 119 mg/dL — ABNORMAL HIGH (ref 65–99)
Potassium: 3.6 mmol/L (ref 3.5–5.2)
Sodium: 141 mmol/L (ref 134–144)

## 2020-06-21 LAB — LIPID PANEL
Chol/HDL Ratio: 2.2 ratio (ref 0.0–4.4)
Cholesterol, Total: 183 mg/dL (ref 100–199)
HDL: 83 mg/dL (ref >39)
LDL Chol Calc (NIH): 67 mg/dL (ref 0–99)
Triglycerides: 204 mg/dL — ABNORMAL HIGH (ref 0–149)
VLDL Cholesterol Cal: 33 mg/dL (ref 5–40)

## 2020-06-21 LAB — MICROALBUMIN / CREATININE URINE RATIO
Creatinine, Urine: 91.8 mg/dL
Microalb/Creat Ratio: 17 mg/g creat (ref 0–29)
Microalbumin, Urine: 15.9 ug/mL

## 2020-06-21 MED ORDER — LOVASTATIN 20 MG PO TABS: 20.0000 mg | ORAL_TABLET | Freq: Every day | ORAL | 3 refills | Status: DC

## 2020-06-21 MED ORDER — TRIAMTERENE-HCTZ 37.5-25 MG PO TABS: 1.0000 | ORAL_TABLET | Freq: Every day | ORAL | 3 refills | Status: DC

## 2020-06-21 MED ORDER — AMLODIPINE BESYLATE 10 MG PO TABS: 10.0000 mg | ORAL_TABLET | Freq: Every day | ORAL | 3 refills | Status: DC

## 2020-06-21 MED ORDER — METFORMIN HCL 500 MG PO TABS: 500.0000 mg | ORAL_TABLET | Freq: Two times a day (BID) | ORAL | 3 refills | Status: DC

## 2020-06-21 MED ORDER — CARVEDILOL 6.25 MG PO TABS: 6.2500 mg | ORAL_TABLET | Freq: Two times a day (BID) | ORAL | 3 refills | Status: DC

## 2020-06-21 NOTE — Progress Notes (Signed)
   CC: HTN  HPI: Ms.Aowyn Viona Gilmore Nahm is a 59 y.o. with PMH listed below presenting with complaint of htn. Please see problem based assessment and plan for further details.  Past Medical History:  Diagnosis Date  . Allergic rhinitis   . Allergy   . Diabetes mellitus without complication ([place])   . GERD (gastroesophageal reflux disease)   . Heart murmur   . 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 2 pap smears since then normal. [facility]  . HTN (hypertension)   . Hx of colonic polyp 03/12/2017   7 mm hyperplastic transverse recall 2023  . Hyperlipidemia   . 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 Not needing corneal transplant.   . Osteoarthritis    Left hip  . Substance abuse ([place])    tobacco, alcohol  . Vaginal bleeding, abnormal June 2010   Thought to be 2/2 DUB vs endometrial CA. Gyn referral made at that time.    Review of Systems: Review of Systems  Constitutional: Negative for chills, fever and malaise/fatigue.  Eyes: Negative for blurred vision.  Respiratory: Negative for shortness of breath.   Cardiovascular: Negative for palpitations and leg swelling.  Gastrointestinal: Negative for constipation, diarrhea, nausea and vomiting.  Neurological: Negative for headaches.  All other systems reviewed and are negative.    Physical Exam: Vitals:   06/20/20 1607 06/20/20 1621  BP: 135/67 124/68  Pulse: 81   Temp: 97.9 F (36.6 C)   TempSrc: Oral   SpO2: 99%   Weight: 197 lb 3.2 oz (89.4 kg)   Height: 5\' 9"  (1.753 m)    Gen: Well-developed, well nourished, NAD HEENT: NCAT head, hearing intact, stable blind L eye CV: RRR, S1, S2 normal Pulm: CTAB, No rales, no wheezes Extm: ROM intact, Peripheral pulses intact, No peripheral edema Skin: Dry, Warm, normal turgor, no wounds, no rashes, no lesions  Assessment & Plan:   Essential hypertension BP Readings from Last 3 Encounters:  06/20/20 124/68  03/16/20 120/66  10/18/19 (!)  142/85   Bp at goal on current regimen. Denies headaches, chest pain, blurry vision, edema. On amlodipine, triamterene-hctz and carvedilol. Not on Ace-I or ARB. Last bmp >1 year prior  - microalbuminuria screen, bmp - C/w triamterene-hctz 37.5-25mg  daily, amlodipine 10mg  daily, carvedilol 6.25mg  BID  Type 2 diabetes mellitus ([place]) Lab Results  Component Value Date   HGBA1C 6.4 (A) 06/20/2020   Ms.Babler is a 59 yo F w/ PMH of DM, HTN, GERD, HLD presenting to [facility] for management of her chronic conditions. On her last visit she wanted to see if she could control her blood sugar with lifestyle modifications alone. Metformin was discontinued at the time and she was encouraged to avoid high carb diet. She denies any polyuria, polydipsia or polyphagia since discontinuing metformin but mentions limited adherence to her diabetic diet.  - Check hgb a1c - Will need to restart metformin if elevated - Microalbuminuria screen, lipid panel - Has upcoming appt with her optho for retinopathy screen - F/u in 3 months  Addendum: Hgb a1c worsening from 6->6.4. Advised Ms.Garcia to restart metformin 500mg  BID  Hyperlipidemia Pt requires refills on medications with associated diagnosis above.  Reviewed disease process and find this medication to be necessary, will not change dose or alter current therapy.    Patient discussed with Dr. Jimmye Norman  -Gilberto Better, [facility] Pager: [phone]

## 2020-06-21 NOTE — Telephone Encounter (Signed)
Pt called and states the amlodipine, Coreg, and Maxzide RX's are supposed to go to [facility], please resend.  Also states MD was to call with lab results. Thank you, SChaplin, RN,BSN

## 2020-06-21 NOTE — Telephone Encounter (Signed)
Discussed with Cassandra Park regarding her Hgb a1c and need to resume metformin. Cassandra Park expressed understanding. She requests that her metformin and lovastatin get sent to [facility] and her bp meds get sent to [facility] due to cost issues.

## 2020-06-21 NOTE — Telephone Encounter (Signed)
Requesting to speak with a nurse about meds. Please call pt back.  

## 2020-06-21 NOTE — Assessment & Plan Note (Signed)
Lab Results  Component Value Date   HGBA1C 6.4 (A) 06/20/2020   [name] is a 59 yo F w/ PMH of DM, HTN, GERD, HLD presenting to [facility] for management of her chronic conditions. On her last visit she wanted to see if she could control her blood sugar with lifestyle modifications alone. Metformin was discontinued at the time and she was encouraged to avoid high carb diet. She denies any polyuria, polydipsia or polyphagia since discontinuing metformin but mentions limited adherence to her diabetic diet.  - Check hgb a1c - Will need to restart metformin if elevated - Microalbuminuria screen, lipid panel - Has upcoming appt with her optho for retinopathy screen - F/u in 3 months  Addendum: Hgb a1c worsening from 6->6.4. Advised [name] to restart metformin 500mg  BID

## 2020-06-21 NOTE — Assessment & Plan Note (Addendum)
BP Readings from Last 3 Encounters:  06/20/20 124/68  03/16/20 120/66  10/18/19 (!) 142/85   Bp at goal on current regimen. Denies headaches, chest pain, blurry vision, edema. On amlodipine, triamterene-hctz and carvedilol. Not on Ace-I or ARB. Last bmp >1 year prior  - microalbuminuria screen, bmp - C/w triamterene-hctz 37.5-25mg  daily, amlodipine 10mg  daily, carvedilol 6.25mg  BID

## 2020-06-21 NOTE — Assessment & Plan Note (Signed)
Pt requires refills on medications with associated diagnosis above.  Reviewed disease process and find this medication to be necessary, will not change dose or alter current therapy. 

## 2020-06-22 NOTE — Progress Notes (Signed)
DOS 06/20/20:  Internal Medicine Clinic Attending  Case discussed with Dr. Truman Hayward  At the time of the visit.  We reviewed the resident's history and exam and pertinent patient test results.  I agree with the assessment, diagnosis, and plan of care documented in the resident's note.

## 2020-08-07 ENCOUNTER — Ambulatory Visit: Payer: PRIVATE HEALTH INSURANCE

## 2020-08-28 ENCOUNTER — Ambulatory Visit: Payer: PRIVATE HEALTH INSURANCE

## 2020-09-27 ENCOUNTER — Ambulatory Visit: Payer: PRIVATE HEALTH INSURANCE

## 2020-10-15 ENCOUNTER — Other Ambulatory Visit: Payer: Self-pay | Admitting: Student

## 2020-10-15 ENCOUNTER — Telehealth: Payer: Self-pay

## 2020-10-15 ENCOUNTER — Telehealth: Payer: Self-pay | Admitting: Student

## 2020-10-15 DIAGNOSIS — K219 Gastro-esophageal reflux disease without esophagitis: Secondary | ICD-10-CM

## 2020-10-15 MED ORDER — PANTOPRAZOLE SODIUM 40 MG PO TBEC: 40.0000 mg | DELAYED_RELEASE_TABLET | Freq: Every day | ORAL | 0 refills | Status: DC

## 2020-10-15 NOTE — Telephone Encounter (Signed)
Nothing on current med list.  However, there is a hx of protonix KK'X and GERD is listed in her problem list.   NOV is 10/22/20. Will forward to team for consideration. SChaplin, RN,BSN

## 2020-10-15 NOTE — Telephone Encounter (Signed)
Requesting refill on acid reflux med, pt is using  [place], [place] Phone:  [phone]  Fax:  [phone]

## 2020-10-15 NOTE — Telephone Encounter (Signed)
Patient called stating she has had return of heartburn symptoms after eating spicy/acidic foods since running out of Protonix. Denies CP or any other symptoms concerning for cardiac etiology.   Refilled protonix 40mg  daily x 30 days w/ [facility] follow up coming up.   Jeralyn Bennett, PGY1 10/15/2020, 5:50 PM Pager: [phone]

## 2020-10-17 NOTE — Telephone Encounter (Signed)
Created in error.   Jeralyn Bennett, MD 10/17/2020, 8:25 PM Pager: [phone]

## 2020-10-22 ENCOUNTER — Encounter: Payer: Self-pay | Admitting: Student

## 2020-10-22 ENCOUNTER — Other Ambulatory Visit: Payer: Self-pay

## 2020-10-22 ENCOUNTER — Ambulatory Visit: Payer: Self-pay | Admitting: Student

## 2020-10-22 VITALS — BP 139/74 | HR 86 | Temp 97.6°F | Ht 69.0 in | Wt 194.0 lb

## 2020-10-22 DIAGNOSIS — I10 Essential (primary) hypertension: Secondary | ICD-10-CM

## 2020-10-22 DIAGNOSIS — E119 Type 2 diabetes mellitus without complications: Secondary | ICD-10-CM

## 2020-10-22 LAB — POCT GLYCOSYLATED HEMOGLOBIN (HGB A1C): Hemoglobin A1C: 6.2 % — AB (ref 4.0–5.6)

## 2020-10-22 LAB — GLUCOSE, CAPILLARY: Glucose-Capillary: 97 mg/dL (ref 70–99)

## 2020-10-22 NOTE — Progress Notes (Signed)
   CC: f/u for diabetes  HPI:  [name] is a 60 y.o. F with history of listed below presenting to the [facility] for f/u of diabetes. Please see individualized problem based charting for full HPI.  Past Medical History:  Diagnosis Date  . Allergic rhinitis   . Allergy   . Diabetes mellitus without complication ([place])   . GERD (gastroesophageal reflux disease)   . Heart murmur   . 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 2 pap smears since then normal. [facility] in [facility]/07 [facility]  . HTN (hypertension)   . Hx of colonic polyp 03/12/2017   7 mm hyperplastic transverse recall 2023  . Hyperlipidemia   . 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 Not needing corneal transplant.   . Osteoarthritis    Left hip  . Substance abuse ([place])    tobacco, alcohol  . Vaginal bleeding, abnormal June 2010   Thought to be 2/2 DUB vs endometrial CA. Gyn referral made at that time.    Review of Systems:  Negative aside from that listed in individualized problem based charting.  Physical Exam:  Vitals:   10/22/20 1523  BP: 139/74  Pulse: 86  Temp: 97.6 F (36.4 C)  TempSrc: Oral  SpO2: 99%  Weight: 194 lb (88 kg)  Height: 5\' 9"  (1.753 m)   Physical Exam Constitutional:      Appearance: She is obese. She is not ill-appearing.  HENT:     Mouth/Throat:     Mouth: Mucous membranes are moist.     Pharynx: Oropharynx is clear. No oropharyngeal exudate.  Eyes:     Extraocular Movements: Extraocular movements intact.     Conjunctiva/sclera: Conjunctivae normal.     Comments: Blind in left eye, chronic.  Cardiovascular:     Rate and Rhythm: Normal rate and regular rhythm.     Pulses: Normal pulses.     Heart sounds: No friction rub. No gallop.   Pulmonary:     Effort: Pulmonary effort is normal.     Breath sounds: Normal breath sounds. No wheezing, rhonchi or rales.  Abdominal:     General: Bowel sounds are normal. There is no distension.     Palpations: Abdomen is  soft.     Tenderness: There is no abdominal tenderness.  Musculoskeletal:        General: Normal range of motion.     Cervical back: Normal range of motion.     Comments: 0-1+ pitting edema bilaterally.  Skin:    General: Skin is warm and dry.  Neurological:     General: No focal deficit present.     Mental Status: She is alert and oriented to person, place, and time.  Psychiatric:        Mood and Affect: Mood normal.        Behavior: Behavior normal.      Assessment & Plan:   See Encounters Tab for problem based charting.  Patient discussed with Dr. Heber Elmont

## 2020-10-22 NOTE — Patient Instructions (Signed)
Ms Mangal,  It was a pleasure seeing you in the clinic today.   We talked about your diabetes today. Your sugar control is looking great, so please keep it up!  Please call our clinic at [phone] if you have any questions or concerns. The best time to call is Monday-Friday from 9am-4pm, but there is someone available 24/7 at the same number. If you need medication refills, please notify your pharmacy one week in advance and they will send [place] a request.   Thank you for letting us take part in your care. We look forward to seeing you next time!

## 2020-10-22 NOTE — Assessment & Plan Note (Signed)
Patient with history of very well-controlled T2DM, on metformin 500mg  BID. HbA1c today 6.2%. Encouraged patient to continue with current medication and lifestyle modifications as she has been doing.  -f/u in 3 months for repeat HbA1c -continue metformin

## 2020-10-22 NOTE — Assessment & Plan Note (Addendum)
Patient with history of HTN, on triamterene-HCTZ 37.5-25mg  daily, norvasc 10mg  daily, and coreg 6.25mg  BID. BP today 139/74. Will continue current meds. Continue DASH diet.

## 2020-10-24 NOTE — Progress Notes (Signed)
Internal Medicine Clinic Attending  Case discussed with Dr. Jinwala  At the time of the visit.  We reviewed the resident's history and exam and pertinent patient test results.  I agree with the assessment, diagnosis, and plan of care documented in the resident's note.  

## 2020-10-30 ENCOUNTER — Ambulatory Visit
Admission: RE | Admit: 2020-10-30 | Discharge: 2020-10-30 | Disposition: A | Discharge status: Home / Self Care | Payer: No Typology Code available for payment source | Source: Ambulatory Visit | Attending: Obstetrics and Gynecology | Admitting: Obstetrics and Gynecology

## 2020-10-30 ENCOUNTER — Ambulatory Visit: Payer: Self-pay | Admitting: *Deleted

## 2020-10-30 ENCOUNTER — Other Ambulatory Visit: Payer: Self-pay

## 2020-10-30 VITALS — BP 130/74 | Wt 191.9 lb

## 2020-10-30 DIAGNOSIS — Z1231 Encounter for screening mammogram for malignant neoplasm of breast: Secondary | ICD-10-CM

## 2020-10-30 DIAGNOSIS — Z1239 Encounter for other screening for malignant neoplasm of breast: Secondary | ICD-10-CM

## 2020-10-30 NOTE — Progress Notes (Signed)
[name] is a 60 y.o. female who presents to [facility] clinic today with no complaints.    Pap Smear: Pap smear not completed today. Last Pap smear was 03/16/2020 at [facility] and normal. Per patient has a history of an abnormal Pap smear when she was around 60 years old that cryotherapy was completed for follow up. Per patient all Pap smears have been normal since cryotherapy. Last Pap smear result is in EPIC.   Physical exam: Breasts Breasts symmetrical. No skin abnormalities bilateral breasts. No nipple retraction bilateral breasts. No nipple discharge bilateral breasts. No lymphadenopathy. No lumps palpated bilateral breasts. No complaints of pain or tenderness on exam.       Pelvic/Bimanual Pap is not indicated today per BCCCP guidelines.   Smoking History: Patientis a former smoker that quit 03/29/2013.    Patient Navigation: Patient education provided. Access to services provided for patient through [facility] program.   Colorectal Cancer Screening: Patient has had colonoscopy completed on 03/06/2017 at [facility]. Patient completed a FIT Test 03/22/2015 that was negative. No complaints today.    Breast and Cervical Cancer Risk Assessment: Patient hasafamily history of a maternal aunt and three cousins havingbreast cancer. Patient has noknown genetic mutations or history ofradiation treatment to the chest before age 49. Per patient hasahistory of cervical dysplasia. Patient has no history of beingimmunocompromised or DES exposure in-utero.  Risk Assessment    Risk Scores      10/30/2020 08/02/2019   Last edited by: Demetrius Revel, LPN [place] Stiner, Heath Gold, RN   5-year risk: 1.2 % 1.1 %   Lifetime risk: 5 % 5.3 %          A: BCCCP exam without pap smear No complaints.  P: Referred patient to the [place] for a screening mammogram on the mobile unit. Appointment scheduled Tuesday, October 30, 2020 at 1600.  Loletta Parish, RN 10/30/2020 3:13 PM

## 2020-10-30 NOTE — Patient Instructions (Signed)
Explained breast self awareness with Cassandra Park. Patient did not need a Pap smear today due to last Pap smear and HPV typing was 03/16/2020. Let her know BCCCP will cover Pap smears and HPV typing every 5 years unless has a history of abnormal Pap smears. Referred patient to the [place] for a screening mammogram on the mobile unit. Appointment scheduled Tuesday, October 30, 2020 at 1600. Patient escorted to the mobile unit following BCCCP appointment for her screening mammogram. Let patient know the [facility] will follow up with her within the next couple weeks with results of her mammogram by letter or phone. Cassandra Park verbalized understanding.  [name], [name], RN 3:14 PM

## 2020-11-01 ENCOUNTER — Ambulatory Visit: Payer: PRIVATE HEALTH INSURANCE

## 2020-11-12 ENCOUNTER — Telehealth: Payer: Self-pay | Admitting: *Deleted

## 2020-11-12 NOTE — Telephone Encounter (Signed)
Per dental log / dental appointment 11-2020. Patient is established.

## 2021-01-01 ENCOUNTER — Other Ambulatory Visit: Payer: Self-pay | Admitting: Internal Medicine

## 2021-01-01 DIAGNOSIS — E119 Type 2 diabetes mellitus without complications: Secondary | ICD-10-CM

## 2021-01-10 ENCOUNTER — Other Ambulatory Visit: Payer: Self-pay

## 2021-01-10 ENCOUNTER — Encounter: Payer: Self-pay | Admitting: Internal Medicine

## 2021-01-10 ENCOUNTER — Ambulatory Visit: Payer: Self-pay | Admitting: Internal Medicine

## 2021-01-10 VITALS — BP 127/69 | HR 88 | Temp 98.3°F | Ht 69.0 in | Wt 193.0 lb

## 2021-01-10 DIAGNOSIS — E119 Type 2 diabetes mellitus without complications: Secondary | ICD-10-CM

## 2021-01-10 DIAGNOSIS — Z0000 Encounter for general adult medical examination without abnormal findings: Secondary | ICD-10-CM

## 2021-01-10 DIAGNOSIS — I10 Essential (primary) hypertension: Secondary | ICD-10-CM

## 2021-01-10 DIAGNOSIS — Z23 Encounter for immunization: Secondary | ICD-10-CM

## 2021-01-10 DIAGNOSIS — E785 Hyperlipidemia, unspecified: Secondary | ICD-10-CM

## 2021-01-10 LAB — POCT GLYCOSYLATED HEMOGLOBIN (HGB A1C): Hemoglobin A1C: 6.1 % — AB (ref 4.0–5.6)

## 2021-01-10 LAB — GLUCOSE, CAPILLARY: Glucose-Capillary: 110 mg/dL — ABNORMAL HIGH (ref 70–99)

## 2021-01-10 MED ORDER — TRIAMTERENE-HCTZ 37.5-25 MG PO TABS: 1.0000 | ORAL_TABLET | Freq: Every day | ORAL | 3 refills | Status: DC

## 2021-01-10 MED ORDER — LOVASTATIN 20 MG PO TABS
20.0000 mg | ORAL_TABLET | Freq: Every day | ORAL | 3 refills | Status: DC
Start: 2021-01-10 — End: 2021-08-01

## 2021-01-10 MED ORDER — AMLODIPINE BESYLATE 10 MG PO TABS: 10.0000 mg | ORAL_TABLET | Freq: Every day | ORAL | 3 refills | Status: DC

## 2021-01-10 MED ORDER — CARVEDILOL 6.25 MG PO TABS: 6.2500 mg | ORAL_TABLET | Freq: Two times a day (BID) | ORAL | 3 refills | Status: DC

## 2021-01-10 NOTE — Assessment & Plan Note (Addendum)
BP Readings from Last 3 Encounters:  01/10/21 127/69  10/30/20 130/74  10/22/20 139/74   At goal this visit. Tolerating triamterene-hctz 37.5-25mg , amlodipine 10mg , coreg 6.25mg  BID. Denies lower extremity swelling, light-headedness, chest pain. Last bmp 06/2020 w/o electrolytes - C/w current regimen - BMP

## 2021-01-10 NOTE — Progress Notes (Signed)
CC: Blood pressure  HPI: Cassandra Park is a 60 y.o. with PMH listed below presenting with complaint of . Please see problem based assessment and plan for further details.  Past Medical History:  Diagnosis Date  . Allergic rhinitis   . Allergy   . Diabetes mellitus without complication ([place])   . GERD (gastroesophageal reflux disease)   . Heart murmur   . 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 2 pap smears since then normal. Repeat pap in 3/07 at [facility],.  . HTN (hypertension)   . Hx of colonic polyp 03/12/2017   7 mm hyperplastic transverse recall 2023  . Hyperlipidemia   . Keratoconus    f/u @ [facility]. Not needing corneal transplant.   . Osteoarthritis    Left hip  . Substance abuse ([place])    tobacco, alcohol  . Vaginal bleeding, abnormal June 2010   Thought to be 2/2 DUB vs endometrial CA. Gyn referral made at that time.    Review of Systems: Review of Systems  Constitutional: Negative for chills, fever and malaise/fatigue.  Respiratory: Negative for shortness of breath.   Cardiovascular: Negative for chest pain, palpitations and leg swelling.  Gastrointestinal: Negative for constipation, diarrhea, nausea and vomiting.  Neurological: Negative for dizziness, sensory change and headaches.  All other systems reviewed and are negative.    Physical Exam: Vitals:   01/10/21 1518  BP: 127/69  Pulse: 88  Temp: 98.3 F (36.8 C)  TempSrc: Oral  SpO2: 99%  Weight: 193 lb (87.5 kg)  Height: 5\' 9"  (1.753 m)   Gen: Well-developed, well nourished, NAD HEENT: NCAT head, hearing intact, blind left eye CV: RRR, S1, S2 normal Pulm: CTAB, No rales, no wheezes Extm: ROM intact, Peripheral pulses intact, No peripheral edema Skin: Dry, Warm, normal turgor  Assessment & Plan:   Essential hypertension BP Readings from Last 3 Encounters:  01/10/21 127/69  10/30/20 130/74  10/22/20 139/74   At goal this visit. Tolerating triamterene-hctz  37.5-25mg , amlodipine 10mg , coreg 6.25mg  BID. Denies lower extremity swelling, light-headedness, chest pain. Last bmp 06/2020 w/o electrolytes - C/w current regimen - BMP  Type 2 diabetes mellitus ([place]) Lab Results  Component Value Date   HGBA1C 6.1 (A) 01/10/2021   At goal hgb a1c <7. Currently on metformin 500mg  BID. Denies nausea, vomiting, diarrhea. Advised on spacing out a1c checks as she has quite stable glycemic control. - Metformin 500mg  BID - F/u in 6 months  Preventative health care Appears to be >10 yrs since last tdap. Discussed need for booster and patient agreed but noted currently on CAFA and uninsured. Discussed deferred until medical insurance. Also discussed current self-administration of aspirin 81mg  daily. She mentions she was started on aspirin 'a long time ago' and cannot recall why. She denies any prior hx of cardiovascular events including CVA or MI. Discussed in detail most recent 2019 ACC/AHA guideline on aspirin use for primary prevention. Cassandra Park expressed understanding and wishes to d/c daily aspirin.  - D/c daily aspirin - Tdap when insured  Hyperlipidemia Lab Results  Component Value Date   CHOL 183 06/20/2020   HDL 83 06/20/2020   LDLCALC 67 06/20/2020   TRIG 204 (H) 06/20/2020   CHOLHDL 2.2 06/20/2020   Last lipid panel on 2021. Currently on lovastatin 20mg  daily. LDL at goal <70 - C/w lovastatin 20mg  daily    Patient discussed with Dr. Evette Doffing  -Gilberto Better, [facility] Pager:  [phone] 

## 2021-01-10 NOTE — Patient Instructions (Signed)
Dear [name],  Thank you for allowing [place] to provide your care today. Today we discussed your blood pressure    I have ordered bmp labs for you. I will call if any are abnormal.    Today we made the following changes to your medications:    Please follow-up in 6 months.    Please call the internal medicine center clinic if you have any questions or concerns, we may be able to help and keep you from a long and expensive emergency room wait. Our clinic and after hours phone number is [phone], the best time to call is Monday through Friday 9 am to 4 pm but there is always someone available 24/7 if you have an emergency. If you need medication refills please notify your pharmacy one week in advance and they will send [place] a request.    If you have not gotten the COVID vaccine, I recommend doing so:  You may get it at your local [facility] or [facility] OR To schedule an appointment for a COVID vaccine or be added to the vaccine wait list: Go to [email or web address]   OR Go to [email or web address]                  OR Call [phone]                                     OR Call [phone] and select Option 2  Thank you for choosing [facility]   How to Use Compression Stockings Compression stockings are elastic socks that squeeze the legs. They help increase blood flow (circulation) to the legs, decrease swelling in the legs, and reduce the chance of developing blood clots in the lower legs. Compression stockings are often used by people who:  Are recovering from surgery.  Have poor circulation in their legs.  Tend to get blood clots in their legs.  Have bulging (varicose) veins.  Sit or stay in bed for long periods of time. Follow instructions from your health care provider about how and when to wear your compression stockings. How to wear compression stockings Before you put on your compression stockings:  Make sure that they are the correct size and degree of  compression. If you do not know your size or required grade of compression, ask your health care provider and follow the manufacturer's instructions that come with the stockings.  Make sure that they are clean, dry, and in good condition.  Check them for rips and tears. Do not put them on if they are ripped or torn. Put your stockings on first thing in the morning, before you get out of bed. Keep them on for as long as your health care provider advises. When you are wearing your stockings:  Keep them as smooth as possible. Do not allow them to bunch up. It is especially important to prevent the stockings from bunching up around your toes or behind your knees.  Do not roll the stockings downward and leave them rolled down. This can decrease blood flow to your leg.  Change them right away if they become wet or dirty. When you take off your stockings, inspect your legs and feet. Check for:  Open sores.  Red spots.  Swelling. General tips  Do not stop wearing compression stockings without talking to your health care provider first.  Wash your stockings every day with mild detergent in cold  or warm water. Do not use bleach. Air-dry your stockings or dry them in a clothes dryer on low heat. It may be helpful to have two pairs so that you have a pair to wear while the other is being washed.  Replace your stockings every 3-6 months.  If skin moisturizing is part of your treatment plan, apply lotion or cream at night so that your skin will be dry when you put on the stockings in the morning. It is harder to put the stockings on when you have lotion on your legs or feet.  Wear nonskid shoes or slip-resistant socks when walking while wearing compression stockings. Contact a health care provider and remove your stockings if you have:  A feeling of pins and needles in your feet or legs.  Open sores, red spots, or other skin changes on your feet or legs.  Swelling or pain that gets worse. Get  help right away if you have:  Numbness or tingling in your lower legs that does not get better right after you take the stockings off.  Toes or feet that are unusually cold or turn a bluish color.  A warm or red area on your leg.  New swelling or soreness in your leg.  Shortness of breath.  Chest pain.  A fast or irregular heartbeat.  Light-headedness.  Dizziness. Summary  Compression stockings are elastic socks that squeeze the legs.  They help increase blood flow (circulation) to the legs, decrease swelling in the legs, and reduce the chance of developing blood clots in the lower legs.  Follow instructions from your health care provider about how and when to wear your compression stockings.  Do not stop wearing your compression stockings without talking to your health care provider first. This information is not intended to replace advice given to you by your health care provider. Make sure you discuss any questions you have with your health care provider. Document Revised: 01/04/2020 Document Reviewed: 01/04/2020 Elsevier Patient Education  2021 [employer].

## 2021-01-10 NOTE — Addendum Note (Signed)
Addended by: Mosetta Anis on: 01/10/2021 04:54 PM   Modules accepted: Orders

## 2021-01-10 NOTE — Assessment & Plan Note (Signed)
Lab Results  Component Value Date   CHOL 183 06/20/2020   HDL 83 06/20/2020   LDLCALC 67 06/20/2020   TRIG 204 (H) 06/20/2020   CHOLHDL 2.2 06/20/2020   Last lipid panel on 2021. Currently on lovastatin 20mg  daily. LDL at goal <70 - C/w lovastatin 20mg  daily

## 2021-01-10 NOTE — Assessment & Plan Note (Signed)
Lab Results  Component Value Date   HGBA1C 6.1 (A) 01/10/2021   At goal hgb a1c <7. Currently on metformin 500mg  BID. Denies nausea, vomiting, diarrhea. Advised on spacing out a1c checks as she has quite stable glycemic control. - Metformin 500mg  BID - F/u in 6 months

## 2021-01-10 NOTE — Assessment & Plan Note (Addendum)
Appears to be >10 yrs since last tdap. Discussed need for booster and patient agreed but noted currently on CAFA and uninsured. Discussed deferred until medical insurance. Also discussed current self-administration of aspirin 81mg  daily. She mentions she was started on aspirin 'a long time ago' and cannot recall why. She denies any prior hx of cardiovascular events including CVA or MI. Discussed in detail most recent 2019 ACC/AHA guideline on aspirin use for primary prevention. Cassandra Park expressed understanding and wishes to d/c daily aspirin.  - D/c daily aspirin - Tdap when insured

## 2021-01-11 ENCOUNTER — Telehealth: Payer: Self-pay | Admitting: Internal Medicine

## 2021-01-11 LAB — BMP8+ANION GAP
Anion Gap: 13 mmol/L (ref 10.0–18.0)
BUN/Creatinine Ratio: 15 (ref 12–28)
BUN: 12 mg/dL (ref 8–27)
CO2: 27 mmol/L (ref 20–29)
Calcium: 9.5 mg/dL (ref 8.7–10.3)
Chloride: 101 mmol/L (ref 96–106)
Creatinine, Ser: 0.8 mg/dL (ref 0.57–1.00)
Glucose: 100 mg/dL — ABNORMAL HIGH (ref 65–99)
Potassium: 3.8 mmol/L (ref 3.5–5.2)
Sodium: 141 mmol/L (ref 134–144)
eGFR: 83 mL/min/{1.73_m2} (ref >59)

## 2021-01-11 NOTE — Progress Notes (Signed)
Internal Medicine Clinic Attending  Case discussed with Dr. Lee  At the time of the visit.  We reviewed the resident's history and exam and pertinent patient test results.  I agree with the assessment, diagnosis, and plan of care documented in the resident's note.    

## 2021-01-11 NOTE — Telephone Encounter (Signed)
Spoke with Cassandra Park and discussed her stable lab results. All other questions and concerns addressed.

## 2021-01-15 ENCOUNTER — Other Ambulatory Visit: Payer: Self-pay | Admitting: *Deleted

## 2021-01-15 DIAGNOSIS — I10 Essential (primary) hypertension: Secondary | ICD-10-CM

## 2021-01-15 MED ORDER — TRIAMTERENE-HCTZ 37.5-25 MG PO TABS
1.0000 | ORAL_TABLET | Freq: Every day | ORAL | 3 refills | Status: DC
  Filled 2021-03-14: qty 30, 30d supply, fill #0

## 2021-01-15 MED ORDER — AMLODIPINE BESYLATE 10 MG PO TABS
10.0000 mg | ORAL_TABLET | Freq: Every day | ORAL | 3 refills | Status: DC
  Filled 2021-03-14: qty 30, 30d supply, fill #0

## 2021-01-15 MED ORDER — CARVEDILOL 6.25 MG PO TABS
6.2500 mg | ORAL_TABLET | Freq: Two times a day (BID) | ORAL | 3 refills | Status: DC
  Filled 2021-03-14: qty 60, 30d supply, fill #0

## 2021-01-15 NOTE — Telephone Encounter (Signed)
Pt states amlodipine, carvedilol, and maxzide rxs need to be sent to the [facility]. And she gets metformin and lovastatin from [facility].

## 2021-02-02 ENCOUNTER — Encounter: Payer: Self-pay | Admitting: *Deleted

## 2021-03-14 ENCOUNTER — Telehealth: Payer: Self-pay

## 2021-03-14 ENCOUNTER — Other Ambulatory Visit: Payer: Self-pay

## 2021-03-14 NOTE — Telephone Encounter (Signed)
Returned pt'c call.  Pt is currently waiting for her renewed orange card to be mailed to her. In the meantime she cannot pickup her meds from [facility] and [facility] Pt will be out of med by July 5th.   Will call pharmacy to tx prescriptions.

## 2021-03-15 ENCOUNTER — Other Ambulatory Visit: Payer: Self-pay

## 2021-04-23 ENCOUNTER — Ambulatory Visit (INDEPENDENT_AMBULATORY_CARE_PROVIDER_SITE_OTHER): Payer: Self-pay | Admitting: Internal Medicine

## 2021-04-23 ENCOUNTER — Other Ambulatory Visit: Payer: Self-pay

## 2021-04-23 VITALS — BP 113/66 | HR 86 | Temp 98.0°F | Wt 184.6 lb

## 2021-04-23 DIAGNOSIS — I10 Essential (primary) hypertension: Secondary | ICD-10-CM

## 2021-04-23 DIAGNOSIS — Z760 Encounter for issue of repeat prescription: Secondary | ICD-10-CM

## 2021-04-23 DIAGNOSIS — K219 Gastro-esophageal reflux disease without esophagitis: Secondary | ICD-10-CM

## 2021-04-23 DIAGNOSIS — Z7984 Long term (current) use of oral hypoglycemic drugs: Secondary | ICD-10-CM

## 2021-04-23 DIAGNOSIS — E785 Hyperlipidemia, unspecified: Secondary | ICD-10-CM

## 2021-04-23 DIAGNOSIS — E119 Type 2 diabetes mellitus without complications: Secondary | ICD-10-CM

## 2021-04-23 LAB — POCT GLYCOSYLATED HEMOGLOBIN (HGB A1C): Hemoglobin A1C: 6.2 % — AB (ref 4.0–5.6)

## 2021-04-23 LAB — GLUCOSE, CAPILLARY: Glucose-Capillary: 101 mg/dL — ABNORMAL HIGH (ref 70–99)

## 2021-04-23 MED ORDER — TRIAMTERENE-HCTZ 37.5-25 MG PO TABS
1.0000 | ORAL_TABLET | Freq: Every day | ORAL | 3 refills | Status: DC
  Filled 2021-04-23: qty 30, 30d supply, fill #0
  Filled 2021-05-27: qty 30, 30d supply, fill #1
  Filled 2021-06-25: qty 30, 30d supply, fill #2

## 2021-04-23 MED ORDER — AMLODIPINE BESYLATE 10 MG PO TABS
10.0000 mg | ORAL_TABLET | Freq: Every day | ORAL | 3 refills | Status: DC
  Filled 2021-04-23: qty 30, 30d supply, fill #0
  Filled 2021-05-27: qty 30, 30d supply, fill #1
  Filled 2021-06-25: qty 30, 30d supply, fill #2

## 2021-04-23 MED ORDER — PANTOPRAZOLE SODIUM 40 MG PO TBEC
40.0000 mg | DELAYED_RELEASE_TABLET | Freq: Every day | ORAL | 1 refills | Status: DC
  Filled 2021-04-23: qty 30, 30d supply, fill #0
  Filled 2021-05-27: qty 30, 30d supply, fill #1

## 2021-04-23 MED ORDER — CARVEDILOL 6.25 MG PO TABS
6.2500 mg | ORAL_TABLET | Freq: Two times a day (BID) | ORAL | 3 refills | Status: DC
  Filled 2021-04-23: qty 60, 30d supply, fill #0
  Filled 2021-05-27: qty 60, 30d supply, fill #1
  Filled 2021-06-25: qty 60, 30d supply, fill #2

## 2021-04-23 NOTE — Progress Notes (Addendum)
   CC: checkup/refill  HPI:[name] is a 60 y.o. female who presents for evaluation of checkup/refill. Please see individual problem based A/P for details.  Please see encounters tab for problem based charting.  Depression, PHQ-9: Based on the patients  [place] Visit from 04/23/2021 in [facility]  PHQ-9 Total Score 0      score we have 0.  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 2 pap smears since then normal. Repeat pap in 3/07 at [facility],.   HTN (hypertension)    Hx of colonic polyp 03/12/2017   7 mm hyperplastic transverse recall 2023   Hyperlipidemia    Keratoconus    f/u @ [facility] center. Not needing corneal transplant.    Osteoarthritis    Left hip   Substance abuse ([place])    tobacco, alcohol   Vaginal bleeding, abnormal June 2010   Thought to be 2/2 DUB vs endometrial CA. Gyn referral made at that time.   Review of Systems:   Review of Systems  Constitutional: Negative.   HENT: Negative.    Respiratory: Negative.    Cardiovascular: Negative.   Gastrointestinal:  Positive for heartburn.  Genitourinary: Negative.   Musculoskeletal: Negative.   Skin: Negative.   Neurological: Negative.   Psychiatric/Behavioral: Negative.      Physical Exam: Vitals:   04/23/21 1408  BP: 113/66  Pulse: 86  Temp: 98 F (36.7 C)  TempSrc: Oral  SpO2: 99%  Weight: 184 lb 9.6 oz (83.7 kg)     General: alert and oriented. No acute distress HEENT: cataract left eye. Conjunctiva nl , antiicteric sclerae, moist mucous membranes, no exudate or erythema Cardiovascular: Normal rate, regular rhythm.  No murmurs, rubs, or gallops. Pedal pulses intact Pulmonary : Equal breath sounds, No wheezes, rales, or rhonchi Abdominal: soft, nontender,   bowel sounds present Ext: No edema in lower extremities, no tenderness to palpation of lower extremities.  Skin: no obvious ulcers  Assessment & Plan:   See Encounters Tab for problem based charting.  Patient seen with Dr. Philipp Ovens

## 2021-04-23 NOTE — Assessment & Plan Note (Signed)
Lipid panel ordered, will need to f/u with results - continue lovastatin '20mg'$  for now

## 2021-04-23 NOTE — Assessment & Plan Note (Signed)
Patient reporting that her symptoms have returned after running out of her pantoprazole - refilled pantoprazole '40mg'$ 

## 2021-04-23 NOTE — Patient Instructions (Addendum)
Dear Mrs. Morse,  Thank you for trusting me with your care today.  Today we discussed the following concerns:  Medication refills: I have refilled your amlodipine, triamterene-hydrochlorothiazide, carvedilol, and pantoprazole I sent the medications to the [facility] located on [address]. [phone].  Their phone number is [phone]  Hypertension: Your blood pressure is well controlled today. We will continue with your current medications.  Hyperlipidemia: We will repeat your lipid panel and call you with the results  Diabetes: Your A1c today was 6.2. This is within our acceptable range. We will continue your metformin for now.  We will check a urine micro-albumin and call you with the results.  Reflux I have refilled your pantoprazole prescription.

## 2021-04-23 NOTE — Assessment & Plan Note (Signed)
No symptoms of hyperglycemia reported.  - foot exam with no concerning findings today. Sensation intact, no obvious ulcers. Pulses intact. - a1c 6.2 today, stable. - urine mico-albumin collected, will follow up on results - continue metformin 500

## 2021-04-24 ENCOUNTER — Encounter: Payer: Self-pay | Admitting: Internal Medicine

## 2021-04-24 LAB — LIPID PANEL
Chol/HDL Ratio: 2.2 ratio (ref 0.0–4.4)
Cholesterol, Total: 185 mg/dL (ref 100–199)
HDL: 85 mg/dL (ref >39)
LDL Chol Calc (NIH): 78 mg/dL (ref 0–99)
Triglycerides: 127 mg/dL (ref 0–149)
VLDL Cholesterol Cal: 22 mg/dL (ref 5–40)

## 2021-04-24 LAB — MICROALBUMIN / CREATININE URINE RATIO
Creatinine, Urine: 99.4 mg/dL
Microalb/Creat Ratio: 14 mg/g creat (ref 0–29)
Microalbumin, Urine: 13.7 ug/mL

## 2021-04-24 NOTE — Assessment & Plan Note (Signed)
BP well controlled today. No complaints of lightheadedness, CP, or LEE. - continue current regimen of amlodipine '10mg'$ , triamterene-hydrochlorothiazide 37.5-25, carvedilol 6.25

## 2021-04-29 NOTE — Progress Notes (Signed)
Internal Medicine Clinic Attending  I saw and evaluated the patient.  I personally confirmed the key portions of the history and exam documented by Dr. Gawaluck and I reviewed pertinent patient test results.  The assessment, diagnosis, and plan were formulated together and I agree with the documentation in the resident's note.  

## 2021-05-27 ENCOUNTER — Other Ambulatory Visit: Payer: Self-pay | Admitting: Internal Medicine

## 2021-05-27 ENCOUNTER — Other Ambulatory Visit: Payer: Self-pay

## 2021-05-27 DIAGNOSIS — E119 Type 2 diabetes mellitus without complications: Secondary | ICD-10-CM

## 2021-05-29 ENCOUNTER — Other Ambulatory Visit: Payer: Self-pay

## 2021-06-25 ENCOUNTER — Other Ambulatory Visit: Payer: Self-pay | Admitting: Internal Medicine

## 2021-06-25 ENCOUNTER — Other Ambulatory Visit: Payer: Self-pay

## 2021-06-25 DIAGNOSIS — K219 Gastro-esophageal reflux disease without esophagitis: Secondary | ICD-10-CM

## 2021-06-26 ENCOUNTER — Other Ambulatory Visit: Payer: Self-pay

## 2021-06-27 NOTE — Telephone Encounter (Signed)
pantoprazole (PROTONIX) 40 MG tablet, refill request @ [facility].

## 2021-06-28 ENCOUNTER — Other Ambulatory Visit: Payer: Self-pay

## 2021-06-28 DIAGNOSIS — K219 Gastro-esophageal reflux disease without esophagitis: Secondary | ICD-10-CM

## 2021-06-28 MED ORDER — PANTOPRAZOLE SODIUM 40 MG PO TBEC
40.0000 mg | DELAYED_RELEASE_TABLET | Freq: Every day | ORAL | 1 refills | Status: DC
  Filled 2021-06-28: qty 30, 30d supply, fill #0

## 2021-06-28 NOTE — Telephone Encounter (Signed)
Patient calling again for refill on pantoprazole. Will route to Attending Pool.

## 2021-06-28 NOTE — Telephone Encounter (Signed)
Patient notified refill has been sent. She is very Patent attorney.

## 2021-07-23 ENCOUNTER — Other Ambulatory Visit: Payer: Self-pay | Admitting: Internal Medicine

## 2021-07-23 DIAGNOSIS — E119 Type 2 diabetes mellitus without complications: Secondary | ICD-10-CM

## 2021-08-01 ENCOUNTER — Other Ambulatory Visit: Payer: Self-pay

## 2021-08-01 ENCOUNTER — Encounter: Payer: Self-pay | Admitting: Internal Medicine

## 2021-08-01 ENCOUNTER — Ambulatory Visit: Payer: Self-pay | Admitting: Internal Medicine

## 2021-08-01 VITALS — BP 123/57 | HR 77 | Temp 98.2°F | Ht 69.0 in | Wt 183.5 lb

## 2021-08-01 DIAGNOSIS — E785 Hyperlipidemia, unspecified: Secondary | ICD-10-CM

## 2021-08-01 DIAGNOSIS — Z0000 Encounter for general adult medical examination without abnormal findings: Secondary | ICD-10-CM

## 2021-08-01 DIAGNOSIS — E119 Type 2 diabetes mellitus without complications: Secondary | ICD-10-CM

## 2021-08-01 DIAGNOSIS — K219 Gastro-esophageal reflux disease without esophagitis: Secondary | ICD-10-CM

## 2021-08-01 DIAGNOSIS — I10 Essential (primary) hypertension: Secondary | ICD-10-CM

## 2021-08-01 LAB — POCT GLYCOSYLATED HEMOGLOBIN (HGB A1C): Hemoglobin A1C: 6 % — AB (ref 4.0–5.6)

## 2021-08-01 LAB — GLUCOSE, CAPILLARY: Glucose-Capillary: 89 mg/dL (ref 70–99)

## 2021-08-01 MED ORDER — AMLODIPINE BESYLATE 10 MG PO TABS
10.0000 mg | ORAL_TABLET | Freq: Every day | ORAL | 0 refills | Status: DC
  Filled 2021-08-01: qty 30, 30d supply, fill #0
  Filled 2021-08-30: qty 30, 30d supply, fill #1
  Filled 2021-10-01: qty 30, 30d supply, fill #0
  Filled 2021-10-01: qty 30, 30d supply, fill #2

## 2021-08-01 MED ORDER — METFORMIN HCL 500 MG PO TABS: 500.0000 mg | ORAL_TABLET | Freq: Two times a day (BID) | ORAL | 0 refills | Status: DC

## 2021-08-01 MED ORDER — LOVASTATIN 20 MG PO TABS: 20.0000 mg | ORAL_TABLET | Freq: Every day | ORAL | 3 refills | Status: DC

## 2021-08-01 MED ORDER — TRIAMTERENE-HCTZ 37.5-25 MG PO TABS
1.0000 | ORAL_TABLET | Freq: Every day | ORAL | 3 refills | Status: DC
  Filled 2021-08-01: qty 30, 30d supply, fill #0
  Filled 2021-08-30: qty 30, 30d supply, fill #1
  Filled 2021-10-02: qty 30, 30d supply, fill #0
  Filled 2021-10-31: qty 30, 30d supply, fill #1

## 2021-08-01 MED ORDER — PANTOPRAZOLE SODIUM 40 MG PO TBEC
40.0000 mg | DELAYED_RELEASE_TABLET | Freq: Every day | ORAL | 2 refills | Status: DC
  Filled 2021-08-01: qty 30, 30d supply, fill #0
  Filled 2021-08-30: qty 30, 30d supply, fill #1
  Filled 2021-10-01: qty 30, 30d supply, fill #2
  Filled 2021-10-01: qty 30, 30d supply, fill #0
  Filled 2021-10-31: qty 30, 30d supply, fill #1
  Filled 2021-12-06: qty 30, 30d supply, fill #2
  Filled 2022-01-02: qty 90, 90d supply, fill #3

## 2021-08-01 MED ORDER — CARVEDILOL 6.25 MG PO TABS
6.2500 mg | ORAL_TABLET | Freq: Two times a day (BID) | ORAL | 3 refills | Status: DC
  Filled 2021-08-01 – 2021-10-01 (×2): qty 60, 30d supply, fill #0
  Filled 2021-10-01 – 2021-10-31 (×2): qty 60, 30d supply, fill #1

## 2021-08-01 NOTE — Assessment & Plan Note (Signed)
A1c today of 6, down from 6.2 three months ago.  Glucose 89.  P: Continue metformin 500 mg twice daily

## 2021-08-01 NOTE — Assessment & Plan Note (Signed)
The patient is here today for medication refills only.  She does not have any specific complaints or concerns at this time.  BP in acceptable limits at 123/57.  P: Medications refilled in the room.  Follow-up in 3 months for routine follow-up.

## 2021-08-01 NOTE — Progress Notes (Signed)
   CC: medication refills  HPI:  [name] is a 60 y.o. with past medical history as noted below who presents to the clinic today for medication refills. Please see problem-based list for further details, assessments, and plans.  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 2 pap smears since then normal. Repeat pap in 3/07 at women's normal,.   HTN (hypertension)    Hx of colonic polyp 03/12/2017   7 mm hyperplastic transverse recall 2023   Hyperlipidemia    Keratoconus    f/u @ [facility]. Not needing corneal transplant.    Osteoarthritis    Left hip   Substance abuse ([place])    tobacco, alcohol   Vaginal bleeding, abnormal June 2010   Thought to be 2/2 DUB vs endometrial CA. Gyn referral made at that time.   Review of Systems:   All systems were reviewed and are negative.  Physical Exam:  Vitals:   08/01/21 1520  BP: (!) 123/57  Pulse: 77  Temp: 98.2 F (36.8 C)  TempSrc: Oral  SpO2: 100%  Weight: 183 lb 8 oz (83.2 kg)  Height: 5\' 9"  (1.753 m)   Physical Exam Constitutional:      General: She is not in acute distress.    Appearance: Normal appearance.  HENT:     Head: Normocephalic and atraumatic.  Eyes:     Extraocular Movements: Extraocular movements intact.     Pupils: Pupils are equal, round, and reactive to light.  Cardiovascular:     Rate and Rhythm: Normal rate and regular rhythm.     Heart sounds: No murmur heard.   No friction rub. No gallop.  Pulmonary:     Effort: Pulmonary effort is normal. No respiratory distress.     Breath sounds: Normal breath sounds. No wheezing, rhonchi or rales.  Abdominal:     General: Abdomen is flat. There is no distension.  Musculoskeletal:        General: Normal range of motion.  Skin:    General: Skin is warm and  dry.  Neurological:     General: No focal deficit present.     Mental Status: She is alert and oriented to person, place, and time. Mental status is at baseline.  Psychiatric:        Mood and Affect: Mood normal.        Behavior: Behavior normal.     Assessment & Plan:   See Encounters Tab for problem based charting.  Patient seen with Dr. Jimmye Norman

## 2021-08-01 NOTE — Patient Instructions (Addendum)
Thank you, [name] for allowing [place] to provide your care today. Today we discussed your medication refills.     1) I refilled your medications with you in the room. I sent two of them to [facility] and the other meds to [facility].  2) Your A1c is better and your sugars look good!  I have ordered the following labs for you:   Lab Orders         Glucose, capillary         POC Hbg A1C      Referrals ordered today:   Referral Orders  No referral(s) requested today     I have ordered the following medication/changed the following medications:   Stop the following medications: Medications Discontinued During This Encounter  Medication Reason   lovastatin (MEVACOR) 20 MG tablet Reorder   metFORMIN (GLUCOPHAGE) 500 MG tablet Reorder   amLODipine (NORVASC) 10 MG tablet Reorder   carvedilol (COREG) 6.25 MG tablet Reorder   triamterene-hydrochlorothiazide (MAXZIDE-25) 37.5-25 MG tablet Reorder   pantoprazole (PROTONIX) 40 MG tablet Reorder     Start the following medications: Meds ordered this encounter  Medications   metFORMIN (GLUCOPHAGE) 500 MG tablet    Sig: Take 1 tablet (500 mg total) by mouth 2 (two) times daily with a meal.    Dispense:  180 tablet    Refill:  0   lovastatin (MEVACOR) 20 MG tablet    Sig: Take 1 tablet (20 mg total) by mouth at bedtime.    Dispense:  90 tablet    Refill:  3   carvedilol (COREG) 6.25 MG tablet    Sig: Take 1 tablet (6.25 mg total) by mouth 2 (two) times daily with a meal.    Dispense:  180 tablet    Refill:  3   triamterene-hydrochlorothiazide (MAXZIDE-25) 37.5-25 MG tablet    Sig: Take 1 tablet by mouth daily.    Dispense:  90 tablet    Refill:  3   amLODipine (NORVASC) 10 MG tablet    Sig: Take 1 tablet (10 mg total) by mouth daily.    Dispense:  90 tablet    Refill:  0   pantoprazole (PROTONIX) 40 MG tablet    Sig: Take 1 tablet (40 mg total) by mouth daily.    Dispense:  90 tablet    Refill:  2      Follow up: 3 months for routine follow-up   Should you have any questions or concerns please call the internal medicine clinic at [phone].

## 2021-08-01 NOTE — Assessment & Plan Note (Signed)
BP of 123/57 today.  Continue carvedilol 6.5 mg BID, triamterene-HCTZ 37.5-25mg  daily, and amlodipine 10 mg daily.

## 2021-08-02 ENCOUNTER — Other Ambulatory Visit: Payer: Self-pay

## 2021-08-06 NOTE — Progress Notes (Signed)
Internal Medicine Clinic Attending ° °Case discussed with Dr. Bonanno  °  At the time of the visit.  We reviewed the resident’s history and exam and pertinent patient test results.  I agree with the assessment, diagnosis, and plan of care documented in the resident’s note. ° °

## 2021-08-30 ENCOUNTER — Other Ambulatory Visit: Payer: Self-pay

## 2021-09-02 ENCOUNTER — Other Ambulatory Visit: Payer: Self-pay

## 2021-10-01 ENCOUNTER — Other Ambulatory Visit: Payer: Self-pay | Admitting: Obstetrics and Gynecology

## 2021-10-01 ENCOUNTER — Other Ambulatory Visit: Payer: Self-pay

## 2021-10-02 ENCOUNTER — Other Ambulatory Visit: Payer: Self-pay

## 2021-10-07 ENCOUNTER — Other Ambulatory Visit: Payer: Self-pay

## 2021-10-07 DIAGNOSIS — Z1231 Encounter for screening mammogram for malignant neoplasm of breast: Secondary | ICD-10-CM

## 2021-10-31 ENCOUNTER — Other Ambulatory Visit: Payer: Self-pay | Admitting: Internal Medicine

## 2021-10-31 ENCOUNTER — Other Ambulatory Visit: Payer: Self-pay

## 2021-10-31 DIAGNOSIS — I10 Essential (primary) hypertension: Secondary | ICD-10-CM

## 2021-10-31 MED ORDER — AMLODIPINE BESYLATE 10 MG PO TABS
10.0000 mg | ORAL_TABLET | Freq: Every day | ORAL | 0 refills | Status: DC
  Filled 2021-10-31: qty 30, 30d supply, fill #0

## 2021-10-31 NOTE — Telephone Encounter (Signed)
amLODipine (NORVASC) 10 MG tablet, REFILL REQUEST @ [facility] at [facility].

## 2021-11-01 ENCOUNTER — Other Ambulatory Visit: Payer: Self-pay

## 2021-11-04 ENCOUNTER — Other Ambulatory Visit: Payer: Self-pay

## 2021-11-18 ENCOUNTER — Other Ambulatory Visit (HOSPITAL_COMMUNITY): Payer: Self-pay

## 2021-11-19 ENCOUNTER — Other Ambulatory Visit: Payer: Self-pay

## 2021-11-19 ENCOUNTER — Ambulatory Visit: Payer: PRIVATE HEALTH INSURANCE

## 2021-11-19 ENCOUNTER — Ambulatory Visit (INDEPENDENT_AMBULATORY_CARE_PROVIDER_SITE_OTHER): Payer: Self-pay | Admitting: Internal Medicine

## 2021-11-19 ENCOUNTER — Encounter: Payer: PRIVATE HEALTH INSURANCE | Admitting: Internal Medicine

## 2021-11-19 VITALS — BP 137/68 | HR 82 | Temp 97.9°F | Ht 69.0 in | Wt 171.9 lb

## 2021-11-19 DIAGNOSIS — E119 Type 2 diabetes mellitus without complications: Secondary | ICD-10-CM

## 2021-11-19 DIAGNOSIS — I10 Essential (primary) hypertension: Secondary | ICD-10-CM

## 2021-11-19 LAB — POCT GLYCOSYLATED HEMOGLOBIN (HGB A1C): Hemoglobin A1C: 5.8 % — AB (ref 4.0–5.6)

## 2021-11-19 LAB — GLUCOSE, CAPILLARY: Glucose-Capillary: 101 mg/dL — ABNORMAL HIGH (ref 70–99)

## 2021-11-19 MED ORDER — AMLODIPINE BESYLATE 10 MG PO TABS: 10.0000 mg | ORAL_TABLET | Freq: Every day | ORAL | 2 refills | Status: DC

## 2021-11-19 MED ORDER — CARVEDILOL 6.25 MG PO TABS
6.2500 mg | ORAL_TABLET | Freq: Two times a day (BID) | ORAL | 3 refills | Status: DC
Start: 2021-11-19 — End: 2022-02-24
  Filled 2021-11-19 – 2021-12-03 (×2): qty 180, 90d supply, fill #0

## 2021-11-19 MED ORDER — TRIAMTERENE-HCTZ 37.5-25 MG PO TABS
1.0000 | ORAL_TABLET | Freq: Every day | ORAL | 3 refills | Status: DC
  Filled 2021-11-19: qty 90, 90d supply, fill #0
  Filled 2021-12-03 – 2021-12-06 (×2): qty 30, 30d supply, fill #0
  Filled 2021-12-23: qty 30, 30d supply, fill #1
  Filled 2022-01-30: qty 30, 30d supply, fill #2

## 2021-11-19 MED ORDER — CARVEDILOL 6.25 MG PO TABS: 6.2500 mg | ORAL_TABLET | Freq: Two times a day (BID) | ORAL | 3 refills | Status: DC

## 2021-11-19 MED ORDER — AMLODIPINE BESYLATE 10 MG PO TABS
10.0000 mg | ORAL_TABLET | Freq: Every day | ORAL | 2 refills | Status: DC
  Filled 2021-11-19 – 2021-12-03 (×2): qty 30, 30d supply, fill #0
  Filled 2022-01-30: qty 30, 30d supply, fill #1

## 2021-11-19 MED ORDER — TRIAMTERENE-HCTZ 37.5-25 MG PO TABS: 1.0000 | ORAL_TABLET | Freq: Every day | ORAL | 3 refills | Status: DC

## 2021-11-19 NOTE — Patient Instructions (Signed)
Thank you, Cassandra Park for allowing [place] to provide your care today. Today we discussed your diabetes and blood pressures. ? ?Blood pressure: I have sent you refills to [facility] ? ?Diabetes: Your diabetes lab looks great today! (5.8). We will stop metformin for now and try healthy diet and exercise to control your diabetes. I want to see you in 3 months to repeat this lab.   ? ?I have ordered the following labs for you: ? ? ?Lab Orders    ?     Glucose, capillary    ?     POC Hbg A1C     ? ?Referrals ordered today:  ? ?Referral Orders  ?No referral(s) requested today  ?  ? ?I have ordered the following medication/changed the following medications:  ? ? ?Start the following medications: ?Meds ordered this encounter  ?Medications  ? DISCONTD: amLODipine (NORVASC) 10 MG tablet  ?  Sig: Take 1 tablet (10 mg total) by mouth daily.  ?  Dispense:  30 tablet  ?  Refill:  2  ? DISCONTD: carvedilol (COREG) 6.25 MG tablet  ?  Sig: Take 1 tablet (6.25 mg total) by mouth 2 (two) times daily with a meal.  ?  Dispense:  180 tablet  ?  Refill:  3  ? DISCONTD: triamterene-hydrochlorothiazide (MAXZIDE-25) 37.5-25 MG tablet  ?  Sig: Take 1 tablet by mouth daily.  ?  Dispense:  90 tablet  ?  Refill:  3  ? amLODipine (NORVASC) 10 MG tablet  ?  Sig: Take 1 tablet (10 mg total) by mouth daily.  ?  Dispense:  30 tablet  ?  Refill:  2  ? carvedilol (COREG) 6.25 MG tablet  ?  Sig: Take 1 tablet (6.25 mg total) by mouth 2 (two) times daily with a meal.  ?  Dispense:  180 tablet  ?  Refill:  3  ? triamterene-hydrochlorothiazide (MAXZIDE-25) 37.5-25 MG tablet  ?  Sig: Take 1 tablet by mouth daily.  ?  Dispense:  90 tablet  ?  Refill:  3  ?  ? ?Follow up: 3 months  ? ? ?Should you have any questions or concerns please call the internal medicine clinic at [phone].   ? ? ? ?

## 2021-11-19 NOTE — Assessment & Plan Note (Signed)
BP Readings from Last 3 Encounters:  ?11/19/21 137/68  ?08/01/21 (!) 123/57  ?04/23/21 113/66  ? ?The patient is here for a 42-monthfollow-up. She endorses compliance with her medications at home and is requesting more refills. ? ?Plan:  ?Refilled amlodipine, triamterene-HCTZ, and coreg regimens today. ?

## 2021-11-19 NOTE — Assessment & Plan Note (Addendum)
The patient is on metformin 500 mg twice daily for her DM. A1c today of 5.8. Family is inquiring whether she needs to be on it with her A1c at goal. Pt says would prefer to be off of it so that she is taking fewer meds every day. ? ?Plan: ?A1c is at goal today, therefore can hold off on taking metformin and continue to implement lifestyle changes. Pt counseled on healthy diet and regular exercise. Follow-up in 3 months with repeat A1c.  ?

## 2021-11-19 NOTE — Progress Notes (Signed)
? ?  CC: Routine follow-up ? ?HPI: ? ?[name] Cassandra Park is a 61 y.o. with past medical history as noted below who presents to the clinic today for a routine follow-up/medication refills. Please see problem-based list for further details, assessments, and plans. ? ?Past Medical History:  ?Diagnosis Date  ? Allergic rhinitis   ? Allergy   ? Diabetes mellitus without complication ([place])   ? GERD (gastroesophageal reflux disease)   ? Heart murmur   ? HPV in female   ? high risk on pap smear in 2005. 2 pap smears since then normal. [facility]  ? HTN (hypertension)   ? Hx of colonic polyp 03/12/2017  ? 7 mm hyperplastic transverse recall 2023  ? Hyperlipidemia   ? Keratoconus   ? f/u @ [facility]. Not needing corneal transplant.   ? Osteoarthritis   ? Left hip  ? Substance abuse ([place])   ? tobacco, alcohol  ? Vaginal bleeding, abnormal June 2010  ? Thought to be 2/2 DUB vs endometrial CA. Gyn referral made at that time.  ? ?Review of Systems: Negative aside from that listed in individualized problem based charting. ? ?Physical Exam: ? ?Vitals:  ? 11/19/21 0907  ?BP: 137/68  ?Pulse: 82  ?Temp: 97.9 ?F (36.6 ?C)  ?TempSrc: Oral  ?SpO2: 100%  ?Weight: 171 lb 14.4 oz (78 kg)  ?Height: '5\' 9"'$  (1.753 m)  ? ?General: NAD, nl appearance ?HE: Normocephalic, atraumatic, EOMI, Conjunctivae normal ?ENT: No congestion, no rhinorrhea, no exudate or erythema  ?Cardiovascular: Normal rate, regular rhythm. No murmurs, rubs, or gallops ?Pulmonary: Effort normal, breath sounds normal. No wheezes, rales, or rhonchi ?Abdominal: soft, nontender, bowel sounds present ?Musculoskeletal: no swelling, deformity, injury or tenderness in extremities ?Skin: Warm, dry, no bruising, erythema, or rash ?Psychiatric/Behavioral: normal mood, normal behavior   ? ? ?Assessment & Plan:  ? ?See Encounters Tab for problem based charting. ? ?Patient discussed with Dr.  Saverio Danker ? ?

## 2021-11-20 NOTE — Progress Notes (Signed)
Internal Medicine Clinic Attending ? ?Case discussed with Dr. Lorin Glass  At the time of the visit.  We reviewed the resident?s history and exam and pertinent patient test results.  I agree with the assessment, diagnosis, and plan of care documented in the resident?s note. Will need BMP at follow-up visit. ? ?

## 2021-11-20 NOTE — Addendum Note (Signed)
Addended by: Charise Killian on: 11/20/2021 02:39 PM ? ? Modules accepted: Level of Service ? ?

## 2021-11-20 NOTE — Addendum Note (Signed)
Addended by: Charise Killian on: 11/20/2021 02:34 PM ? ? Modules accepted: Orders ? ?

## 2021-11-21 ENCOUNTER — Telehealth: Payer: Self-pay | Admitting: *Deleted

## 2021-11-21 ENCOUNTER — Other Ambulatory Visit: Payer: Self-pay

## 2021-11-21 NOTE — Telephone Encounter (Signed)
Call from patient stating meds she received on last visit did not go to [place] as asked.  Call to [facility] prescriptions for Amlodipine, Coreg and Maxide were received.  Patient had available refills and it is too early to fill was the reason she  did not receive a call to pick up meds.  Patient was called and informed of .  Voiced understanding of . ?

## 2021-11-26 ENCOUNTER — Telehealth: Payer: Self-pay | Admitting: Internal Medicine

## 2021-11-26 NOTE — Telephone Encounter (Signed)
Called patient to discuss weight loss of 20 pounds in the last year.  Patient states that she has been cooking less and believes that her weight loss is related to eating less food recently.  Patient was advised to return sooner than scheduled follow-up in June if she continues to lose weight. ?

## 2021-12-03 ENCOUNTER — Other Ambulatory Visit: Payer: Self-pay

## 2021-12-06 ENCOUNTER — Other Ambulatory Visit: Payer: Self-pay

## 2021-12-23 ENCOUNTER — Other Ambulatory Visit: Payer: Self-pay

## 2021-12-24 ENCOUNTER — Other Ambulatory Visit: Payer: Self-pay

## 2022-01-02 ENCOUNTER — Other Ambulatory Visit: Payer: Self-pay

## 2022-01-03 ENCOUNTER — Other Ambulatory Visit: Payer: Self-pay

## 2022-01-14 ENCOUNTER — Ambulatory Visit
Admission: RE | Admit: 2022-01-14 | Discharge: 2022-01-14 | Disposition: A | Discharge status: Home / Self Care | Payer: No Typology Code available for payment source | Source: Ambulatory Visit | Attending: Obstetrics and Gynecology | Admitting: Obstetrics and Gynecology

## 2022-01-14 ENCOUNTER — Ambulatory Visit: Payer: Self-pay | Admitting: *Deleted

## 2022-01-14 VITALS — BP 120/70 | Wt 174.1 lb

## 2022-01-14 DIAGNOSIS — Z1231 Encounter for screening mammogram for malignant neoplasm of breast: Secondary | ICD-10-CM

## 2022-01-14 DIAGNOSIS — Z1239 Encounter for other screening for malignant neoplasm of breast: Secondary | ICD-10-CM

## 2022-01-14 NOTE — Patient Instructions (Signed)
Explained breast self awareness with Cassandra Park. Patient did not need a Pap smear today due to last Pap smear and HPV typing was 03/16/2020. Let her know BCCCP will cover Pap smears and HPV typing every 5 years unless has a history of abnormal Pap smears. Referred patient to the [facility] for a screening mammogram on the mobile unit. Appointment scheduled Thursday, Jan 14, 2022 at 0920. Patient aware of appointment and will be there. Let patient the [facility] know will follow up with her within the next couple weeks with results of her mammogram by letter or phone. Cassandra Park verbalized understanding. ? ?Cassandra Park, [name], RN ?8:46 AM ? ? ? ? ?

## 2022-01-14 NOTE — Progress Notes (Addendum)
[name] is a 61 y.o. female who presents to [facility] clinic today with no complaints.  ?  ?Pap Smear: Pap smear not completed today. Last Pap smear was 03/16/2020 at [facility] and normal with negative HPV. Per patient has a history of an abnormal Pap smear when she was around 61 years old that cryotherapy was completed for follow up. Per patient all Pap smears have been normal since cryotherapy. Last Pap smear result is in EPIC. ?  ?Physical exam: ?Breasts ?Breasts symmetrical. No skin abnormalities bilateral breasts. No nipple retraction bilateral breasts. No nipple discharge bilateral breasts. No lymphadenopathy. No lumps palpated bilateral breasts. No complaints of pain or tenderness on exam.     ? ?MS DIGITAL SCREENING BILATERAL ? ?Result Date: 04/28/2017 ?CLINICAL DATA:  Screening. EXAM: DIGITAL SCREENING BILATERAL MAMMOGRAM WITH CAD COMPARISON:  Previous exam(s). ACR Breast Density Category b: There are scattered areas of fibroglandular density. FINDINGS: There are no findings suspicious for malignancy. Images were processed with CAD. IMPRESSION: No mammographic evidence of malignancy. A result letter of this screening mammogram will be mailed directly to the patient. RECOMMENDATION: Screening mammogram in one year. (Code:SM-B-01Y) BI-RADS CATEGORY  1: Negative. Electronically Signed   By: Dorise Bullion III M.D   On: 04/28/2017 16:51  ? ?MS DIGITAL SCREENING TOMO BILATERAL ? ?Result Date: 11/02/2020 ?CLINICAL DATA:  Screening. EXAM: DIGITAL SCREENING BILATERAL MAMMOGRAM WITH TOMOSYNTHESIS AND CAD TECHNIQUE: Bilateral screening digital craniocaudal and mediolateral oblique mammograms were obtained. Bilateral screening digital breast tomosynthesis was performed. The images were evaluated with computer-aided detection. COMPARISON:  Previous exam(s). ACR Breast Density Category b: There are scattered areas of fibroglandular density. FINDINGS: There are no findings suspicious for  malignancy. IMPRESSION: No mammographic evidence of malignancy. A result letter of this screening mammogram will be mailed directly to the patient. RECOMMENDATION: Screening mammogram in one year. (Code:SM-B-01Y) BI-RADS CATEGORY  1: Negative. Electronically Signed   By: Nolon Nations M.D.   On: 11/02/2020 16:06  ? ?MS DIGITAL SCREENING TOMO BILATERAL ? ?Result Date: 08/03/2019 ?CLINICAL DATA:  Screening. EXAM: DIGITAL SCREENING BILATERAL MAMMOGRAM WITH TOMO AND CAD COMPARISON:  Previous exam(s). ACR Breast Density Category b: There are scattered areas of fibroglandular density. FINDINGS: There are no findings suspicious for malignancy. Images were processed with CAD. IMPRESSION: No mammographic evidence of malignancy. A result letter of this screening mammogram will be mailed directly to the patient. RECOMMENDATION: Screening mammogram in one year. (Code:SM-B-01Y) BI-RADS CATEGORY  1: Negative. Electronically Signed   By: Kristopher Oppenheim M.D.   On: 08/03/2019 11:18  ? ?MS DIGITAL SCREENING TOMO BILATERAL ? ?Result Date: 06/29/2018 ?CLINICAL DATA:  Screening. EXAM: DIGITAL SCREENING BILATERAL MAMMOGRAM WITH TOMO AND CAD COMPARISON:  Previous exam(s). ACR Breast Density Category b: There are scattered areas of fibroglandular density. FINDINGS: There are no findings suspicious for malignancy. Images were processed with CAD. IMPRESSION: No mammographic evidence of malignancy. A result letter of this screening mammogram will be mailed directly to the patient. RECOMMENDATION: Screening mammogram in one year. (Code:SM-B-01Y) BI-RADS CATEGORY  1: Negative. Electronically Signed   By: Ammie Ferrier M.D.   On: 06/29/2018 16:16   ? ?Pelvic/Bimanual ?Pap is not indicated today per BCCCP guidelines. ?  ?Smoking History: ?Patient is a former smoker that quit 03/29/2013.  ?  ?Patient Navigation: ?Patient education provided. Access to services provided for patient through [facility] program.  ? ?Colorectal Cancer  Screening: ?Patient has had colonoscopy completed on 03/06/2017 at [facility]. Patient completed a FIT  Test 03/22/2015 that was negative. No complaints today.  ?  ?Breast and Cervical Cancer Risk Assessment: ?Patient has a family history of a maternal aunt and three cousins having breast cancer. Patient has no known genetic mutations or history of radiation treatment to the chest before age 54. Per patient has a history of cervical dysplasia. Patient has no history of being immunocompromised or DES exposure in-utero. ? ?Risk Assessment   ? ? Risk Scores   ? ?   01/14/2022 10/30/2020  ? Last edited by: Demetrius Revel, LPN [place], Karlton Lemon, LPN  ? 5-year risk: 1.2 % 1.2 %  ? Lifetime risk: 4.9 % 5 %  ? ?  ?  ? ?  ? ? ?A: ?BCCCP exam without pap smear ?No complaints. ? ?P: ?Referred patient to the [facility] for a screening mammogram on the mobile unit. Appointment scheduled Tuesday, Jan 14, 2022 at 0920. ? ?Loletta Parish, RN ?01/14/2022 8:45 AM   ?

## 2022-01-30 ENCOUNTER — Other Ambulatory Visit: Payer: Self-pay

## 2022-01-31 ENCOUNTER — Other Ambulatory Visit: Payer: Self-pay

## 2022-02-24 ENCOUNTER — Encounter: Payer: Self-pay | Admitting: Internal Medicine

## 2022-02-24 ENCOUNTER — Other Ambulatory Visit: Payer: Self-pay

## 2022-02-24 ENCOUNTER — Ambulatory Visit (INDEPENDENT_AMBULATORY_CARE_PROVIDER_SITE_OTHER): Payer: BC Managed Care – PPO | Admitting: Internal Medicine

## 2022-02-24 VITALS — BP 126/66 | HR 75 | Wt 174.7 lb

## 2022-02-24 DIAGNOSIS — Z87891 Personal history of nicotine dependence: Secondary | ICD-10-CM

## 2022-02-24 DIAGNOSIS — K219 Gastro-esophageal reflux disease without esophagitis: Secondary | ICD-10-CM

## 2022-02-24 DIAGNOSIS — E119 Type 2 diabetes mellitus without complications: Secondary | ICD-10-CM | POA: Diagnosis not present

## 2022-02-24 DIAGNOSIS — E785 Hyperlipidemia, unspecified: Secondary | ICD-10-CM | POA: Diagnosis not present

## 2022-02-24 DIAGNOSIS — I10 Essential (primary) hypertension: Secondary | ICD-10-CM | POA: Diagnosis not present

## 2022-02-24 DIAGNOSIS — Z0000 Encounter for general adult medical examination without abnormal findings: Secondary | ICD-10-CM

## 2022-02-24 DIAGNOSIS — Z7984 Long term (current) use of oral hypoglycemic drugs: Secondary | ICD-10-CM

## 2022-02-24 LAB — POCT GLYCOSYLATED HEMOGLOBIN (HGB A1C): Hemoglobin A1C: 6.1 % — AB (ref 4.0–5.6)

## 2022-02-24 LAB — GLUCOSE, CAPILLARY: Glucose-Capillary: 114 mg/dL — ABNORMAL HIGH (ref 70–99)

## 2022-02-24 MED ORDER — LOVASTATIN 20 MG PO TABS: 20.0000 mg | ORAL_TABLET | Freq: Every day | ORAL | 3 refills | Status: DC

## 2022-02-24 MED ORDER — TRIAMTERENE-HCTZ 37.5-25 MG PO TABS
1.0000 | ORAL_TABLET | Freq: Every day | ORAL | 3 refills | Status: DC
  Filled 2022-02-24: qty 90, 90d supply, fill #0
  Filled 2022-07-17: qty 90, 90d supply, fill #1
  Filled 2022-11-12: qty 90, 90d supply, fill #2

## 2022-02-24 MED ORDER — AMLODIPINE BESYLATE 10 MG PO TABS
10.0000 mg | ORAL_TABLET | Freq: Every day | ORAL | 2 refills | Status: DC
  Filled 2022-02-24: qty 30, 30d supply, fill #0
  Filled 2022-05-05: qty 30, 30d supply, fill #1
  Filled 2022-06-20: qty 30, 30d supply, fill #2

## 2022-02-24 MED ORDER — PANTOPRAZOLE SODIUM 40 MG PO TBEC
40.0000 mg | DELAYED_RELEASE_TABLET | Freq: Every day | ORAL | 2 refills | Status: DC
  Filled 2022-02-24 – 2022-03-31 (×2): qty 90, 90d supply, fill #0
  Filled 2022-06-23: qty 90, 90d supply, fill #1
  Filled 2022-09-29: qty 90, 90d supply, fill #2

## 2022-02-24 MED ORDER — CARVEDILOL 6.25 MG PO TABS
6.2500 mg | ORAL_TABLET | Freq: Two times a day (BID) | ORAL | 3 refills | Status: DC
  Filled 2022-02-24: qty 180, 90d supply, fill #0
  Filled 2022-08-28 – 2022-11-12 (×2): qty 180, 90d supply, fill #1

## 2022-02-24 NOTE — Assessment & Plan Note (Signed)
-  Declined Tdap today though stated she may be amenable to it in the future. Please address at next visit.

## 2022-02-24 NOTE — Assessment & Plan Note (Addendum)
BP Readings from Last 3 Encounters:  02/24/22 126/66  01/14/22 120/70  11/19/21 137/68   The patient is here for a routine follow-up. She endorses compliance with her medications at home.  Plan: Continue amlodipine, traimterene-HCTZ, and coreg regimens. -Checking BMP today

## 2022-02-24 NOTE — Patient Instructions (Signed)
Thank you, [name] for allowing [place] to provide your care today. Today we discussed your diabetes and blood pressure.  Your blood pressure looks great today! I will check some labs today and will call you if the results are abnormal.  Your A1c is 6.1 today, which is higher than last time, but for now we will continue lifestyle changes.    I have ordered the following labs for you:  Lab Orders         Glucose, capillary         BMP8+Anion Gap         POC Hbg A1C       Referrals ordered today:   Referral Orders  No referral(s) requested today     I have ordered the following medication/changed the following medications:   Stop the following medications: Medications Discontinued During This Encounter  Medication Reason   lovastatin (MEVACOR) 20 MG tablet Reorder   pantoprazole (PROTONIX) 40 MG tablet Reorder   amLODipine (NORVASC) 10 MG tablet Reorder   carvedilol (COREG) 6.25 MG tablet Reorder   triamterene-hydrochlorothiazide (MAXZIDE-25) 37.5-25 MG tablet Reorder     Start the following medications: Meds ordered this encounter  Medications   amLODipine (NORVASC) 10 MG tablet    Sig: Take 1 tablet (10 mg total) by mouth daily.    Dispense:  30 tablet    Refill:  2   pantoprazole (PROTONIX) 40 MG tablet    Sig: Take 1 tablet (40 mg total) by mouth daily.    Dispense:  90 tablet    Refill:  2   lovastatin (MEVACOR) 20 MG tablet    Sig: Take 1 tablet (20 mg total) by mouth at bedtime.    Dispense:  90 tablet    Refill:  3   triamterene-hydrochlorothiazide (MAXZIDE-25) 37.5-25 MG tablet    Sig: Take 1 tablet by mouth daily.    Dispense:  90 tablet    Refill:  3   carvedilol (COREG) 6.25 MG tablet    Sig: Take 1 tablet (6.25 mg total) by mouth 2 (two) times daily with a meal.    Dispense:  180 tablet    Refill:  3     Follow up: 3 months    Should you have any questions or concerns please call the internal medicine clinic at [phone].

## 2022-02-24 NOTE — Progress Notes (Signed)
   CC: routine follow-up  HPI:  [name] is a 61 y.o. with past medical history as noted below who presents to the clinic today for a routine follow-up. Please see problem-based list for further details, assessments, and plans.   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 2 pap smears since then normal. [facility] 3/07 [facility]   HTN (hypertension)    Hx of colonic polyp 03/12/2017   7 mm hyperplastic transverse recall 2023   Hyperlipidemia    Keratoconus    f/u @ [facility] center. Not needing corneal transplant.    Osteoarthritis    Left hip   Substance abuse ([place])    tobacco, alcohol   Vaginal bleeding, abnormal June 2010   Thought to be 2/2 DUB vs endometrial CA. Gyn referral made at that time.   Review of Systems: Negative aside from that listed in individualized problem based charting.   Physical Exam:  Vitals:   02/24/22 0913  BP: 126/66  Pulse: 75  SpO2: 100%  Weight: 174 lb 11.2 oz (79.2 kg)   General: NAD, nl appearance HE: Normocephalic, atraumatic, EOMI, Conjunctivae normal ENT: No congestion, no rhinorrhea, no exudate or erythema  Cardiovascular: Normal rate, regular rhythm. No murmurs, rubs, or gallops Pulmonary: Effort normal, breath sounds normal. No wheezes, rales, or rhonchi Abdominal: soft, nontender, bowel sounds present Musculoskeletal: no swelling, deformity, injury or tenderness in extremities Skin: Warm, dry, no bruising, erythema, or rash Psychiatric/Behavioral: normal mood, normal behavior      Assessment & Plan:   See Encounters Tab for problem based charting.  Patient discussed with Dr. Jimmye Norman

## 2022-02-24 NOTE — Assessment & Plan Note (Signed)
During last visit, metformin was discontinued and pt chose to manage DM with lifestyle changes. Today, she states that her diet has been good overall, eating mostly chicken and sandwiches or whatever her children cook for her. She walks 3x a week for exercise.  A1c of 6.1 today, up from 5.8 three months ago.  Plan: -Continue lifestyle changes -Follow-up in 3 months for A1c check

## 2022-02-25 ENCOUNTER — Other Ambulatory Visit: Payer: Self-pay

## 2022-02-25 LAB — BMP8+ANION GAP
Anion Gap: 15 mmol/L (ref 10.0–18.0)
BUN/Creatinine Ratio: 16 (ref 12–28)
BUN: 10 mg/dL (ref 8–27)
CO2: 25 mmol/L (ref 20–29)
Calcium: 9.7 mg/dL (ref 8.7–10.3)
Chloride: 98 mmol/L (ref 96–106)
Creatinine, Ser: 0.62 mg/dL (ref 0.57–1.00)
Glucose: 119 mg/dL — ABNORMAL HIGH (ref 70–99)
Potassium: 3.7 mmol/L (ref 3.5–5.2)
Sodium: 138 mmol/L (ref 134–144)
eGFR: 100 mL/min/{1.73_m2} (ref >59)

## 2022-02-26 NOTE — Progress Notes (Signed)
Internal Medicine Clinic Attending ° °Case discussed with Dr. Bonanno  °  At the time of the visit.  We reviewed the resident’s history and exam and pertinent patient test results.  I agree with the assessment, diagnosis, and plan of care documented in the resident’s note. ° °

## 2022-02-28 ENCOUNTER — Other Ambulatory Visit: Payer: Self-pay

## 2022-03-08 ENCOUNTER — Encounter: Payer: Self-pay | Admitting: *Deleted

## 2022-03-31 ENCOUNTER — Other Ambulatory Visit: Payer: Self-pay

## 2022-04-02 ENCOUNTER — Encounter: Payer: Self-pay | Admitting: Internal Medicine

## 2022-04-02 ENCOUNTER — Other Ambulatory Visit: Payer: Self-pay

## 2022-05-05 ENCOUNTER — Other Ambulatory Visit: Payer: Self-pay

## 2022-05-09 ENCOUNTER — Other Ambulatory Visit: Payer: Self-pay

## 2022-05-23 LAB — HM DIABETES EYE EXAM

## 2022-06-17 ENCOUNTER — Encounter: Payer: Self-pay | Admitting: Dietician

## 2022-06-19 ENCOUNTER — Encounter: Payer: Self-pay | Admitting: Family Medicine

## 2022-06-19 ENCOUNTER — Ambulatory Visit: Payer: BC Managed Care – PPO | Admitting: Family Medicine

## 2022-06-19 VITALS — BP 135/69 | HR 81 | Temp 98.1°F | Ht 69.0 in | Wt 182.3 lb

## 2022-06-19 DIAGNOSIS — E785 Hyperlipidemia, unspecified: Secondary | ICD-10-CM | POA: Diagnosis not present

## 2022-06-19 DIAGNOSIS — I10 Essential (primary) hypertension: Secondary | ICD-10-CM

## 2022-06-19 DIAGNOSIS — E119 Type 2 diabetes mellitus without complications: Secondary | ICD-10-CM | POA: Diagnosis not present

## 2022-06-19 DIAGNOSIS — Z86010 Personal history of colonic polyps: Secondary | ICD-10-CM

## 2022-06-19 DIAGNOSIS — Z0000 Encounter for general adult medical examination without abnormal findings: Secondary | ICD-10-CM

## 2022-06-19 LAB — POCT GLYCOSYLATED HEMOGLOBIN (HGB A1C): Hemoglobin A1C: 6.3 % — AB (ref 4.0–5.6)

## 2022-06-19 LAB — GLUCOSE, CAPILLARY: Glucose-Capillary: 129 mg/dL — ABNORMAL HIGH (ref 70–99)

## 2022-06-19 NOTE — Assessment & Plan Note (Addendum)
Patient's blood pressure is well controlled and at goal.  Her blood pressure in the clinic is 135/69.  Patient is taking all her medications as prescribed without adverse effects.  We will continue her with the same antihypertensive medications with same dosage.  Patient was encouraged to monitor blood pressure at home. Plan: BMP to check electrolytes and kidney function.

## 2022-06-19 NOTE — Assessment & Plan Note (Addendum)
Colonoscopy done in 2018 showed 7 mm transverse hyperplastic polyp.  It is recommended to repeat colonoscopy in 2023.  I spoke with the patient about the colonoscopy.  Patient is scheduled for eye surgery and she would like to hold off colonoscopy until she get eye surgery.  Patient have appointment with ophthalmologist next week.  I have advised the patient that we can put the order in the referral and she can schedule the appointment at her earliest convenience.  Patient agrees with the plan of care.

## 2022-06-19 NOTE — Assessment & Plan Note (Signed)
Patient currently on lovastatin 20 mg her lipid profile done in August 2022 showed LDL of 67.  Patient is LDL is at goal with prescribed medication.  We will continue her on lovastatin 20 mg.

## 2022-06-19 NOTE — Assessment & Plan Note (Signed)
Metformin was discontinued in March 2023 secondary to hemoglobin A1c of 5.8.  Her repeat hemoglobin A1c in June, 2023 increased to 6.1 and today her hemoglobin A1c noted to be 6.3.  We have addressed the hemoglobin A1c change with the patient.  Patient was encouraged to exercise as tolerated and modify her diet to low-carb and limited processed foods from her diet.  We will repeat the hemoglobin A1c in 3 months with anticipation the number will look better as patient agreed to work on diet and exercise.  We will hold off adding metformin to her treatment regimen today.  Patient agrees with the plan of care.

## 2022-06-19 NOTE — Progress Notes (Signed)
   CC: 83-monthfollow-up for hypertension and diabetes  HPI: [name] a 61y.o. female with past medical history listed below presenting for 35-monthollow-up for hypertension and diabetes management.   Patient did not have physical complaints today.Her blood pressure is at goal.  Patient is taking all her blood pressure medications as prescribed. Patient's hemoglobin A1c in March 2023 was 5.8.  She was taken off metformin.  For details of today's visit and the status of his chronic medical issues please refer to the assessment and plan.   Past Medical History:  Diagnosis Date  . Allergic rhinitis   . Allergy   . Diabetes mellitus without complication ([place]  . GERD (gastroesophageal reflux disease)   . Heart murmur   . 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 2 pap smears since then normal. [facility] in 3/07 [facility]  . HTN (hypertension)   . Hx of colonic polyp 03/12/2017   7 mm hyperplastic transverse recall 2023  . Hyperlipidemia   . Keratoconus    [facility] Not needing corneal transplant.   . Osteoarthritis    Left hip  . Substance abuse ([place]   tobacco, alcohol  . Vaginal bleeding, abnormal June 2010   Thought to be 2/2 DUB vs endometrial CA. Gyn referral made at that time.   Review of Systems:  Review of Systems  Constitutional: Negative.   Respiratory:  Negative for shortness of breath.   Cardiovascular:  Negative for chest pain.  Neurological:  Negative for dizziness and headaches.     Physical Exam:Physical Exam Vitals and nursing note reviewed.  Constitutional:      Appearance: Normal appearance.  Eyes:     Conjunctiva/sclera: Conjunctivae normal.  Cardiovascular:     Rate and Rhythm: Normal rate and regular rhythm.     Pulses: Normal pulses.          Dorsalis pedis pulses are 2+ on the right side and 2+ on the left side.     Heart sounds: Normal heart sounds.  Pulmonary:     Effort: Pulmonary effort is  normal.     Breath sounds: Normal breath sounds. No wheezing, rhonchi or rales.  Musculoskeletal:     Cervical back: Neck supple.     Right lower leg: No edema.     Left lower leg: No edema.  Skin:    General: Skin is warm.     Capillary Refill: Capillary refill takes less than 2 seconds.  Neurological:     Mental Status: She is alert and oriented to person, place, and time.     Vitals:   06/19/22 0927  BP: 135/69  Pulse: 81  Temp: 98.1 F (36.7 C)  TempSrc: Oral  SpO2: 100%  Weight: 182 lb 4.8 oz (82.7 kg)  Height: '5\' 9"'$  (1.753 m)     Assessment & Plan:   See Encounters Tab for problem based charting.  Patient seen with Dr. NaDareen Piano

## 2022-06-19 NOTE — Assessment & Plan Note (Signed)
I spoke about the preventative health care.  Patient agreed to get COVID-vaccine at the pharmacy.  Her foot exam was performed today which was normal.  Patient declined to get flu vaccine.  She will get her Tdap on her next visit.  Patient requested to hold off colonoscopy for now as she is scheduled for eye surgery.  We plan to put the referral in and patient will for colonoscopy in January 2024.

## 2022-06-20 ENCOUNTER — Other Ambulatory Visit: Payer: Self-pay

## 2022-06-20 LAB — MICROALBUMIN / CREATININE URINE RATIO
Creatinine, Urine: 244.2 mg/dL
Microalb/Creat Ratio: 20 mg/g creat (ref 0–29)
Microalbumin, Urine: 48.5 ug/mL

## 2022-06-20 NOTE — Progress Notes (Signed)
Internal Medicine Clinic Attending  I saw and evaluated the patient.  I personally confirmed the key portions of the history and exam documented by Dr. Multani and I reviewed pertinent patient test results.  The assessment, diagnosis, and plan were formulated together and I agree with the documentation in the resident's note.  

## 2022-06-21 LAB — BMP8+ANION GAP
Anion Gap: 17 mmol/L (ref 10.0–18.0)
BUN/Creatinine Ratio: 18 (ref 12–28)
BUN: 12 mg/dL (ref 8–27)
CO2: 25 mmol/L (ref 20–29)
Calcium: 9.7 mg/dL (ref 8.7–10.3)
Chloride: 97 mmol/L (ref 96–106)
Creatinine, Ser: 0.67 mg/dL (ref 0.57–1.00)
Glucose: 119 mg/dL — ABNORMAL HIGH (ref 70–99)
Potassium: 3.9 mmol/L (ref 3.5–5.2)
Sodium: 139 mmol/L (ref 134–144)
eGFR: 98 mL/min/{1.73_m2} (ref >59)

## 2022-06-21 LAB — LIPID PANEL
Chol/HDL Ratio: 2.4 ratio (ref 0.0–4.4)
Cholesterol, Total: 218 mg/dL — ABNORMAL HIGH (ref 100–199)
HDL: 90 mg/dL (ref >39)
LDL Chol Calc (NIH): 115 mg/dL — ABNORMAL HIGH (ref 0–99)
Triglycerides: 77 mg/dL (ref 0–149)
VLDL Cholesterol Cal: 13 mg/dL (ref 5–40)

## 2022-06-23 ENCOUNTER — Other Ambulatory Visit: Payer: Self-pay

## 2022-06-24 ENCOUNTER — Other Ambulatory Visit: Payer: Self-pay | Admitting: Family Medicine

## 2022-06-24 DIAGNOSIS — E785 Hyperlipidemia, unspecified: Secondary | ICD-10-CM

## 2022-06-24 MED ORDER — ATORVASTATIN CALCIUM 20 MG PO TABS: 20.0000 mg | ORAL_TABLET | Freq: Every day | ORAL | 2 refills | Status: DC

## 2022-07-17 ENCOUNTER — Other Ambulatory Visit: Payer: Self-pay

## 2022-07-18 ENCOUNTER — Other Ambulatory Visit: Payer: Self-pay

## 2022-07-21 ENCOUNTER — Other Ambulatory Visit: Payer: Self-pay | Admitting: Internal Medicine

## 2022-07-21 ENCOUNTER — Other Ambulatory Visit: Payer: Self-pay

## 2022-07-21 DIAGNOSIS — I10 Essential (primary) hypertension: Secondary | ICD-10-CM

## 2022-07-21 MED ORDER — AMLODIPINE BESYLATE 10 MG PO TABS
10.0000 mg | ORAL_TABLET | Freq: Every day | ORAL | 3 refills | Status: DC
  Filled 2022-07-21: qty 90, 90d supply, fill #0
  Filled 2022-11-12: qty 90, 90d supply, fill #1
  Filled 2023-03-03: qty 90, 90d supply, fill #2
  Filled 2023-06-10: qty 90, 90d supply, fill #3

## 2022-07-22 ENCOUNTER — Other Ambulatory Visit: Payer: Self-pay

## 2022-08-28 ENCOUNTER — Other Ambulatory Visit: Payer: Self-pay

## 2022-09-04 ENCOUNTER — Other Ambulatory Visit: Payer: Self-pay

## 2022-09-16 ENCOUNTER — Encounter: Payer: Self-pay | Admitting: Student

## 2022-09-16 ENCOUNTER — Ambulatory Visit: Payer: BC Managed Care – PPO | Admitting: Student

## 2022-09-16 ENCOUNTER — Other Ambulatory Visit: Payer: Self-pay

## 2022-09-16 VITALS — BP 130/83 | HR 83 | Temp 98.2°F | Ht 69.0 in | Wt 191.9 lb

## 2022-09-16 DIAGNOSIS — K219 Gastro-esophageal reflux disease without esophagitis: Secondary | ICD-10-CM | POA: Diagnosis not present

## 2022-09-16 DIAGNOSIS — Z86010 Personal history of colonic polyps: Secondary | ICD-10-CM

## 2022-09-16 DIAGNOSIS — Z87891 Personal history of nicotine dependence: Secondary | ICD-10-CM

## 2022-09-16 DIAGNOSIS — I10 Essential (primary) hypertension: Secondary | ICD-10-CM

## 2022-09-16 DIAGNOSIS — E119 Type 2 diabetes mellitus without complications: Secondary | ICD-10-CM | POA: Diagnosis not present

## 2022-09-16 NOTE — Assessment & Plan Note (Signed)
Patient continues to feel well.  She walks for exercise.  Generally, she eats a healthy diet, citing example from yesterday of peas, collard greens, and baked chicken.  She eats little to no fried food.  She does snack on chips occasionally.  She was advised to continue walking for exercise and eating a healthy diet.  Will defer hemoglobin A1c today and check at follow-up in 3 months.

## 2022-09-16 NOTE — Assessment & Plan Note (Signed)
Symptoms well controlled on pantoprazole

## 2022-09-16 NOTE — Patient Instructions (Addendum)
Today we discussed health promotion and prevention of diabetes.  Return 3 months for routine follow-up and hemoglobin A1c.    Expect a call from the following department(s):  Referral Orders         Ambulatory referral to Gastroenterology      Please call our clinic at [phone] Monday through Friday from 9 am to 4 pm if you have questions or concerns about your health. If after hours or on the weekend, call the main hospital number and ask for the Internal Medicine Resident On-Call. If you need medication refills, please notify your pharmacy one week in advance and they will send [place] a request.   Best, Nani Gasser, [place]

## 2022-09-16 NOTE — Assessment & Plan Note (Signed)
Asymptomatic.  She expresses a desire to schedule screening colonoscopy.  Referral to gastroenterology placed today.

## 2022-09-16 NOTE — Progress Notes (Signed)
Subjective:  Ms. Cassandra Cassandra is a 62 y.o. who presents to clinic for routine follow-up and health promotion and.  She has no concerns today.  She wonders about refills of her medications.  She would like to schedule screening colonoscopy.  She has had surgery on her right eye to correct cataracts.  Review of systems is normal.  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 2 pap smears since then normal. [facility]   HTN (hypertension)    Hx of colonic polyp 03/12/2017   7 mm hyperplastic transverse recall 2023   Hyperlipidemia    Keratoconus    [facility]. Not needing corneal transplant.    Osteoarthritis    Left hip   Substance abuse ([place] [place])    tobacco, alcohol   Vaginal bleeding, abnormal June 2010   Thought to be 2/2 DUB vs endometrial CA. Gyn referral made at that time.    Past Surgical History:  Procedure Laterality Date   TONSILLECTOMY      Allergies  Allergen Reactions   Lisinopril Swelling    Angioedema     Current Outpatient Medications on File Prior to Visit  Medication Sig Dispense Refill   amLODipine (NORVASC) 10 MG tablet Take 1 tablet (10 mg total) by mouth once daily. 90 tablet 3   atorvastatin (LIPITOR) 20 MG tablet Take 1 tablet (20 mg total) by mouth daily. 30 tablet 2   b complex vitamins capsule Take 1 capsule by mouth daily. 90 capsule 2   calcium-vitamin D (OSCAL WITH D 500-200) 500-200 MG-UNIT per tablet Take 1 tablet by mouth 2 (two) times daily.     carvedilol (COREG) 6.25 MG tablet Take 1 tablet (6.25 mg total) by mouth 2 (two) times daily with a meal. 180 tablet 3   cetirizine (ZYRTEC) 10 MG tablet Take 1 tablet (10 mg total) by mouth daily. 30 tablet 2   pantoprazole (PROTONIX) 40 MG tablet Take 1 tablet (40 mg total)  by mouth once daily. 90 tablet 2   triamterene-hydrochlorothiazide (MAXZIDE-25) 37.5-25 MG tablet Take 1 tablet by mouth once daily. 90 tablet 3   No current facility-administered medications on file prior to visit.    Family History  Problem Relation Age of Onset   Appendicitis Father        deceased   Dementia Mother    Colon cancer Neg Hx    Esophageal cancer Neg Hx    Rectal cancer Neg Hx    Stomach cancer Neg Hx    Pancreatic cancer Neg Hx    Breast cancer Neg Hx     Social History   Socioeconomic History   Marital status: Single    Spouse name: Not on file   Number of children: 3   Years of education: Not on file   Highest education level: High school graduate  Occupational History   Not on file  Tobacco Use   Smoking status: Former    Packs/day: 0.50    Types: Cigarettes   Smokeless tobacco: Former    Quit date: 03/29/2013   Tobacco comments:    already quit on 03/29/13   Vaping Use   Vaping Use: Never used  Substance and  Sexual Activity   Alcohol use: No    Alcohol/week: 0.0 standard drinks of alcohol   Drug use: No   Sexual activity: Not Currently  Other Topics Concern   Not on file  Social History Narrative   Divorced   3 children   Works at [employer] as a Programme researcher, 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 [facility] at [facility] and has [facility], [facility]            Social Determinants of Health   Financial Resource Strain: Low Risk  (09/16/2022)   Overall Financial Resource Strain (CARDIA)    Difficulty of Paying Living Expenses: Not hard at all  Food Insecurity: No Food Insecurity (09/16/2022)   Hunger Vital Sign    Worried About Running Out of Food in the Last Year: Never true    Ran Out of Food in the Last Year: Never true  Transportation Needs: No Transportation Needs (09/16/2022)   PRAPARE - Hydrologist (Medical): No    Lack of Transportation (Non-Medical): No  Physical  Activity: Insufficiently Active (09/16/2022)   Exercise Vital Sign    Days of Exercise per Week: 3 days    Minutes of Exercise per Session: 30 min  Stress: No Stress Concern Present (09/16/2022)   [facility]    Feeling of Stress : Not at all  Social Connections: Moderately Isolated (09/16/2022)   Social Connection and Isolation Panel [NHANES]    Frequency of Communication with Friends and Family: More than three times a week    Frequency of Social Gatherings with Friends and Family: More than three times a week    Attends Religious Services: More than 4 times per year    Active Member of [employer] or Organizations: No    Attends Archivist Meetings: Never    Marital Status: Separated  Intimate Partner Violence: Not At Risk (09/16/2022)   Humiliation, Afraid, Rape, and Kick questionnaire    Fear of Current or Ex-Partner: No    Emotionally Abused: No    Physically Abused: No    Sexually Abused: No    Objective:   Vitals:   09/16/22 1346  BP: 130/83  Pulse: 83  Temp: 98.2 F (36.8 C)  TempSrc: Oral  SpO2: 98%  Weight: 191 lb 14.4 oz (87 kg)  Height: '5\' 9"'$  (1.753 m)   Well-appearing female Heart rate and rhythm are regular.  Bilateral dorsalis pedis pulses 2+. Breathing is regular and unlabored.  Lungs clear to auscultation bilaterally. Skin is warm and dry. Alert and oriented.  No gross neurologic deficits. Pleasant.  Mood and affect are concordant.  Assessment & Plan:  The primary encounter diagnosis was Hx of colonic polyp. Diagnoses of Type 2 diabetes mellitus without complication, without long-term current use of insulin ([facility]), Gastroesophageal reflux disease, unspecified whether esophagitis present, and Essential hypertension were also pertinent to this visit.  Type 2 diabetes mellitus ([facility]) Patient continues to feel well.  She walks for exercise.  Generally, she eats a healthy diet, citing example from  yesterday of peas, collard greens, and baked chicken.  She eats little to no fried food.  She does snack on chips occasionally.  She was advised to continue walking for exercise and eating a healthy diet.  Will defer hemoglobin A1c today and check at follow-up in 3 months.  GERD (gastroesophageal reflux disease) Symptoms well-controlled on pantoprazole.  Essential hypertension BP 130/83 today.  Patient checks her blood pressure at home occasionally and reports numbers consistent with today's clinic measurement.  She reports adherence to her home blood pressure regimen.  She has adequate refills.  Hx of colonic polyp Asymptomatic.  She expresses a desire to schedule screening colonoscopy.  Referral to gastroenterology placed today.    Return 3 months for routine follow-up and hemoglobin A1c.  Patient discussed with Dr. Archie Endo MD 09/16/2022, 2:32 PM  Pager: [phone]

## 2022-09-16 NOTE — Assessment & Plan Note (Signed)
BP 130/83 today.  Patient checks her blood pressure at home occasionally and reports numbers consistent with today's clinic measurement.  She reports adherence to her home blood pressure regimen.  She has adequate refills.

## 2022-09-18 NOTE — Progress Notes (Signed)
Internal Medicine Clinic Attending  Case discussed with the resident at the time of the visit.  We reviewed the resident's history and exam and pertinent patient test results.  I agree with the assessment, diagnosis, and plan of care documented in the resident's note.  

## 2022-09-21 ENCOUNTER — Other Ambulatory Visit: Payer: Self-pay | Admitting: Family Medicine

## 2022-09-21 DIAGNOSIS — E785 Hyperlipidemia, unspecified: Secondary | ICD-10-CM

## 2022-09-29 ENCOUNTER — Other Ambulatory Visit: Payer: Self-pay

## 2022-10-01 ENCOUNTER — Other Ambulatory Visit: Payer: Self-pay

## 2022-11-12 ENCOUNTER — Other Ambulatory Visit: Payer: Self-pay

## 2022-11-13 ENCOUNTER — Other Ambulatory Visit: Payer: Self-pay

## 2022-11-14 ENCOUNTER — Other Ambulatory Visit: Payer: Self-pay

## 2022-12-15 ENCOUNTER — Encounter: Payer: Self-pay | Admitting: Student

## 2022-12-15 DIAGNOSIS — Z1231 Encounter for screening mammogram for malignant neoplasm of breast: Secondary | ICD-10-CM

## 2022-12-16 ENCOUNTER — Telehealth: Payer: Self-pay

## 2022-12-16 NOTE — Telephone Encounter (Signed)
Patient telephoned requesting appointment. Mailed mammogram scholarship application to the patient.

## 2022-12-24 ENCOUNTER — Other Ambulatory Visit: Payer: Self-pay

## 2022-12-25 ENCOUNTER — Other Ambulatory Visit: Payer: Self-pay

## 2022-12-25 DIAGNOSIS — K219 Gastro-esophageal reflux disease without esophagitis: Secondary | ICD-10-CM

## 2022-12-26 ENCOUNTER — Other Ambulatory Visit: Payer: Self-pay | Admitting: Internal Medicine

## 2022-12-26 ENCOUNTER — Other Ambulatory Visit: Payer: Self-pay

## 2022-12-26 DIAGNOSIS — Z1231 Encounter for screening mammogram for malignant neoplasm of breast: Secondary | ICD-10-CM

## 2022-12-26 MED ORDER — PANTOPRAZOLE SODIUM 40 MG PO TBEC
40.0000 mg | DELAYED_RELEASE_TABLET | Freq: Every day | ORAL | 2 refills | Status: DC
  Filled 2022-12-26: qty 90, 90d supply, fill #0

## 2022-12-29 ENCOUNTER — Other Ambulatory Visit: Payer: Self-pay

## 2023-01-05 ENCOUNTER — Ambulatory Visit (INDEPENDENT_AMBULATORY_CARE_PROVIDER_SITE_OTHER): Payer: Self-pay | Admitting: Student

## 2023-01-05 ENCOUNTER — Other Ambulatory Visit (HOSPITAL_COMMUNITY): Payer: Self-pay

## 2023-01-05 ENCOUNTER — Encounter: Payer: Self-pay | Admitting: Student

## 2023-01-05 VITALS — BP 130/70 | HR 64 | Temp 98.2°F | Ht 69.0 in | Wt 193.0 lb

## 2023-01-05 DIAGNOSIS — K219 Gastro-esophageal reflux disease without esophagitis: Secondary | ICD-10-CM

## 2023-01-05 DIAGNOSIS — R3589 Other polyuria: Secondary | ICD-10-CM | POA: Insufficient documentation

## 2023-01-05 DIAGNOSIS — R350 Frequency of micturition: Secondary | ICD-10-CM

## 2023-01-05 DIAGNOSIS — E785 Hyperlipidemia, unspecified: Secondary | ICD-10-CM

## 2023-01-05 DIAGNOSIS — E119 Type 2 diabetes mellitus without complications: Secondary | ICD-10-CM

## 2023-01-05 DIAGNOSIS — Z86010 Personal history of colonic polyps: Secondary | ICD-10-CM

## 2023-01-05 LAB — POCT URINALYSIS DIPSTICK
Bilirubin, UA: NEGATIVE
Blood, UA: NEGATIVE
Glucose, UA: NEGATIVE
Ketones, UA: NEGATIVE
Leukocytes, UA: NEGATIVE
Nitrite, UA: NEGATIVE
Protein, UA: NEGATIVE
Spec Grav, UA: 1.015 (ref 1.010–1.025)
Urobilinogen, UA: 1 E.U./dL
pH, UA: 7.5 (ref 5.0–8.0)

## 2023-01-05 LAB — POCT GLYCOSYLATED HEMOGLOBIN (HGB A1C): Hemoglobin A1C: 7.1 % — AB (ref 4.0–5.6)

## 2023-01-05 LAB — GLUCOSE, CAPILLARY: Glucose-Capillary: 161 mg/dL — ABNORMAL HIGH (ref 70–99)

## 2023-01-05 MED ORDER — METFORMIN HCL 500 MG PO TABS
500.0000 mg | ORAL_TABLET | Freq: Two times a day (BID) | ORAL | 0 refills | Status: DC
Start: 2023-01-05 — End: 2023-04-09
  Filled 2023-01-05: qty 60, 30d supply, fill #0
  Filled 2023-01-06: qty 180, 90d supply, fill #0

## 2023-01-05 NOTE — Assessment & Plan Note (Signed)
Has been postponing her colonoscopy until her eye surgery was complete.  Ready for colonoscopy for colonic polyp that was biopsied in 2018.  Recommendation at that time was for repeat colonoscopy in 5 years.  1 year overdue.

## 2023-01-05 NOTE — Patient Instructions (Signed)
Today we discussed increased urinary frequency and diabetes.  This after visit summary is an important review of tests, referrals, and medication changes that were discussed during your visit. If you have questions or concerns, call [phone]. Outside of clinic business hours, call the main hospital at [phone] and ask the operator for the on-call internal medicine resident.   Ernesta Amble MD 01/05/2023, 1:57 PM

## 2023-01-05 NOTE — Assessment & Plan Note (Signed)
A1c has slowly but surely been climbing since discontinuation of metformin.  Wonder if her urinary frequency is due to hyperglycemia.  Today, no signs or symptoms of neuropathy.  Vision is unchanged from last visit.  CBG around 160 and A1c 7.1%. - Restart metformin 500 mg twice daily

## 2023-01-05 NOTE — Progress Notes (Signed)
    Subjective:  [name] is a 62 y.o. who presents to clinic for the following:  Diabetes  Frequent urination - started Wednesday of last week - wonders if she drinks too much water, also wonders about urinary infection - waking up from sleep once or twice per night - Going 4-5 times per half day - normal amount, emptying bladder fully - 3-4 bottles water per day, 1 soda per day  Review of Systems  Constitutional:  Negative for chills and fever.  Genitourinary:  Positive for frequency. Negative for dysuria, flank pain, hematuria and urgency.  Musculoskeletal:  Negative for joint pain and myalgias.   Objective:   Vitals:   01/05/23 1316 01/05/23 1353  BP: 139/61 130/70  Pulse: 80 64  Temp: 98.2 F (36.8 C)   TempSrc: Oral   SpO2: 94%   Weight: 193 lb (87.5 kg)   Height:  (1.753 m)     Physical Exam Overall well-appearing Heart rate is normal, rhythm is regular, strong radial pulses, no appreciable murmurs Breathing is regular and unlabored, no wheezing or crackles Skin is warm and dry Bilateral lower extremity nonpitting edema Alert and oriented Pleasant, concordant affect  Assessment & Plan:   Type 2 diabetes mellitus (HCC) A1c has slowly but surely been climbing since discontinuation of metformin.  Wonder if her urinary frequency is due to hyperglycemia.  Today, no signs or symptoms of neuropathy.  Vision is unchanged from last visit.  CBG around 160 and A1c 7.1%. - Restart metformin 500 mg twice daily  Hx of colonic polyp Has been postponing her colonoscopy until her eye surgery was complete.  Ready for colonoscopy for colonic polyp that was biopsied in 2018.  Recommendation at that time was for repeat colonoscopy in 5 years.  1 year overdue.  GERD (gastroesophageal reflux disease) No symptoms on daily Protonix.  Has self discontinued before but will have some GERD that comes back after about a month off the med.  No dysphagia.  Recommended  discontinuing this drug to avoid negative effects on bone density and hypomagnesemia.  Can take as needed antacids or PPI.  Frequency of urination and polyuria No other signs or symptoms to suggest infection.  Will assess with a dipstick.  Suspect this is probably secondary to hyperglycemia.    Return 3 months, for diabetes.  Patient discussed with Dr. Elige Ko MD 01/05/2023, 2:09 PM  Pager: [phone]

## 2023-01-05 NOTE — Assessment & Plan Note (Signed)
No other signs or symptoms to suggest infection.  Will assess with a dipstick.  Suspect this is probably secondary to hyperglycemia.

## 2023-01-05 NOTE — Assessment & Plan Note (Signed)
No symptoms on daily Protonix.  Has self discontinued before but will have some GERD that comes back after about a month off the med.  No dysphagia.  Recommended discontinuing this drug to avoid negative effects on bone density and hypomagnesemia.  Can take as needed antacids or PPI.

## 2023-01-06 ENCOUNTER — Other Ambulatory Visit: Payer: Self-pay

## 2023-01-06 ENCOUNTER — Other Ambulatory Visit (HOSPITAL_COMMUNITY): Payer: Self-pay

## 2023-01-06 NOTE — Progress Notes (Signed)
Called.  Discussed normal urine dipstick and hemoglobin A1c.  This person picked up metformin and took the first dose this morning without side effects.

## 2023-01-07 NOTE — Progress Notes (Signed)
Internal Medicine Clinic Attending  Case discussed with the resident at the time of the visit.  We reviewed the resident's history and exam and pertinent patient test results.  I agree with the assessment, diagnosis, and plan of care documented in the resident's note.  

## 2023-01-13 ENCOUNTER — Telehealth: Payer: Self-pay

## 2023-01-13 NOTE — Telephone Encounter (Signed)
Patient called she wanted to let you know she has started taking medication for her acid reflux, patient is requesting a call back.

## 2023-01-13 NOTE — Addendum Note (Signed)
Addended by: Marrianne Mood on: 01/13/2023 10:21 AM   Modules accepted: Orders

## 2023-01-13 NOTE — Telephone Encounter (Signed)
Called to report return of reflux symptoms after discontinuing her pantoprazole.  Recommended that she try this medicine on as needed basis for now.

## 2023-01-29 ENCOUNTER — Other Ambulatory Visit: Payer: Self-pay | Admitting: Internal Medicine

## 2023-01-29 ENCOUNTER — Ambulatory Visit
Admission: RE | Admit: 2023-01-29 | Discharge: 2023-01-29 | Disposition: A | Discharge status: Home / Self Care | Payer: Medicaid Other | Source: Ambulatory Visit | Attending: Internal Medicine | Admitting: Internal Medicine

## 2023-01-29 DIAGNOSIS — Z1231 Encounter for screening mammogram for malignant neoplasm of breast: Secondary | ICD-10-CM

## 2023-03-03 ENCOUNTER — Other Ambulatory Visit: Payer: Self-pay | Admitting: Student

## 2023-03-03 ENCOUNTER — Other Ambulatory Visit: Payer: Self-pay

## 2023-03-03 DIAGNOSIS — K219 Gastro-esophageal reflux disease without esophagitis: Secondary | ICD-10-CM

## 2023-03-03 DIAGNOSIS — I10 Essential (primary) hypertension: Secondary | ICD-10-CM

## 2023-03-03 MED ORDER — TRIAMTERENE-HCTZ 37.5-25 MG PO TABS
1.0000 | ORAL_TABLET | Freq: Every day | ORAL | 3 refills | Status: DC
Start: 2023-03-03 — End: 2023-11-02
  Filled 2023-03-03: qty 90, 90d supply, fill #0
  Filled 2023-06-10: qty 90, 90d supply, fill #1
  Filled 2023-09-14: qty 90, 90d supply, fill #2

## 2023-03-03 NOTE — Telephone Encounter (Signed)
For GERD can take OTC omeprazole or famotidine.

## 2023-03-04 ENCOUNTER — Other Ambulatory Visit: Payer: Self-pay

## 2023-03-06 ENCOUNTER — Other Ambulatory Visit: Payer: Self-pay

## 2023-03-09 ENCOUNTER — Telehealth: Payer: Self-pay | Admitting: *Deleted

## 2023-03-09 MED ORDER — PANTOPRAZOLE SODIUM 40 MG PO TBEC
40.0000 mg | DELAYED_RELEASE_TABLET | Freq: Every day | ORAL | 2 refills | Status: DC | PRN
  Filled 2023-03-09 – 2023-03-20 (×2): qty 30, 30d supply, fill #0
  Filled 2023-04-21: qty 30, 30d supply, fill #1
  Filled 2023-05-20: qty 30, 30d supply, fill #2

## 2023-03-09 NOTE — Telephone Encounter (Signed)
Call from patient states the Omeprazole and Famotidine does not work would like to get the prescription for the Pantoprazole instead.  Send to he [facility] at [address].

## 2023-03-10 ENCOUNTER — Other Ambulatory Visit: Payer: Self-pay

## 2023-03-11 ENCOUNTER — Other Ambulatory Visit: Payer: Self-pay

## 2023-03-20 ENCOUNTER — Other Ambulatory Visit: Payer: Self-pay

## 2023-03-31 ENCOUNTER — Telehealth: Payer: Self-pay

## 2023-03-31 NOTE — Telephone Encounter (Signed)
Patient called and stated she never received a letter or phone call from [facility] regarding her screening mammogram in 01/2023. Patient informed negative mammogram results, may return to [facility] in 1 year to repeat, verbalized understanding.

## 2023-04-06 ENCOUNTER — Encounter: Payer: Self-pay | Admitting: Internal Medicine

## 2023-04-06 ENCOUNTER — Ambulatory Visit: Payer: Medicaid Other | Admitting: Internal Medicine

## 2023-04-06 VITALS — BP 130/67 | HR 80 | Temp 97.8°F | Wt 192.0 lb

## 2023-04-06 DIAGNOSIS — E119 Type 2 diabetes mellitus without complications: Secondary | ICD-10-CM

## 2023-04-06 DIAGNOSIS — Z7984 Long term (current) use of oral hypoglycemic drugs: Secondary | ICD-10-CM

## 2023-04-06 DIAGNOSIS — E785 Hyperlipidemia, unspecified: Secondary | ICD-10-CM | POA: Diagnosis not present

## 2023-04-06 DIAGNOSIS — K219 Gastro-esophageal reflux disease without esophagitis: Secondary | ICD-10-CM

## 2023-04-06 DIAGNOSIS — Z0000 Encounter for general adult medical examination without abnormal findings: Secondary | ICD-10-CM

## 2023-04-06 DIAGNOSIS — Z23 Encounter for immunization: Secondary | ICD-10-CM | POA: Diagnosis not present

## 2023-04-06 LAB — GLUCOSE, CAPILLARY: Glucose-Capillary: 168 mg/dL — ABNORMAL HIGH (ref 70–99)

## 2023-04-06 LAB — POCT GLYCOSYLATED HEMOGLOBIN (HGB A1C): Hemoglobin A1C: 6.2 % — AB (ref 4.0–5.6)

## 2023-04-06 MED ORDER — TETANUS-DIPHTHERIA TOXOIDS TD 5-2 LFU IM INJ
0.5000 mL | INJECTION | Freq: Once | INTRAMUSCULAR | Status: DC
Start: 2023-04-06 — End: 2023-04-06

## 2023-04-06 NOTE — Assessment & Plan Note (Signed)
She continues to have symptoms on days that she does not take Pantoprazole 40 mg. Her symptoms are well controlled when she takes PPI.

## 2023-04-06 NOTE — Assessment & Plan Note (Signed)
Goal for primary prevention. Last LDL 9 months ago at 115. She reports adherence to Atorvastatin 20 mg. Lab Results  Component Value Date   CHOL 218 (H) 06/19/2022   HDL 90 06/19/2022   LDLCALC 115 (H) 06/19/2022   TRIG 77 06/19/2022   CHOLHDL 2.4 06/19/2022   P: Check lipid panel

## 2023-04-06 NOTE — Assessment & Plan Note (Signed)
Received td

## 2023-04-06 NOTE — Patient Instructions (Signed)
Thank you, Ms.CRISTIANNA CYR for allowing [place] to provide your care today.  Diabetes Your A1c looked great today at 6.2. Please continue taking metformin.   You received tetanus shot today. I have included information about shingrix vaccine which you can get at your pharmacy.  I am checking you cholesterol today and will send your medication to [facility].  I have ordered the following labs for you:   Lab Orders         Glucose, capillary         POC Hbg A1C      I have ordered the following medication/changed the following medications:   Stop the following medications: There are no discontinued medications.   Start the following medications: Meds ordered this encounter  Medications   tetanus & diphtheria toxoids (adult) (TENIVAC) injection 0.5 mL     Follow up: 6 months   We look forward to seeing you next time. Please call our clinic at [phone] if you have any questions or concerns. The best time to call is Monday-Friday from 9am-4pm, but there is someone available 24/7. If after hours or the weekend, call the main hospital number and ask for the Internal Medicine Resident On-Call. If you need medication refills, please notify your pharmacy one week in advance and they will send [place] a request.   Thank you for trusting me with your care. Wishing you the best!   Rudene Christians, DO [facility]

## 2023-04-06 NOTE — Assessment & Plan Note (Signed)
Metformin 500 BID restarted 4/24 Lab Results  Component Value Date   HGBA1C 7.1 (A) 01/05/2023  She reports adherence with medications. Repeat A1c today improved to 6.2. P: Follow-up in 6 months to repeat A1c

## 2023-04-06 NOTE — Progress Notes (Signed)
Subjective:  CC: diabetes follow-up  HPI:  [facility] Park is a 62 y.o. female with a past medical history stated below and presents today for diabetes follow-up.   Please see problem based assessment and plan for additional details.  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 2 pap smears since then normal. [facility]   HTN (hypertension)    Hx of colonic polyp 03/12/2017   7 mm hyperplastic transverse recall 2023   Hyperlipidemia    Keratoconus    f/u @ [facility]. Not needing corneal transplant.    Osteoarthritis    Left hip   Substance abuse (HCC)    tobacco, alcohol   Vaginal bleeding, abnormal June 2010   Thought to be 2/2 DUB vs endometrial CA. Gyn referral made at that time.    Current Outpatient Medications on File Prior to Visit  Medication Sig Dispense Refill   amLODipine (NORVASC) 10 MG tablet Take 1 tablet (10 mg total) by mouth once daily. 90 tablet 3   atorvastatin (LIPITOR) 20 MG tablet Take 1 tablet by mouth once daily 90 tablet 3   b complex vitamins capsule Take 1 capsule by mouth daily. 90 capsule 2   calcium-vitamin D (OSCAL WITH D 500-200) 500-200 MG-UNIT per tablet Take 1 tablet by mouth 2 (two) times daily.     carvedilol (COREG) 6.25 MG tablet Take 1 tablet (6.25 mg total) by mouth 2 (two) times daily with a meal. 180 tablet 3   cetirizine (ZYRTEC) 10 MG tablet Take 1 tablet (10 mg total) by mouth daily. 30 tablet 2   metFORMIN (GLUCOPHAGE) 500 MG tablet Take 1 tablet (500 mg total) by mouth 2 (two) times daily with a meal. 180 tablet 0   pantoprazole (PROTONIX) 40 MG tablet Take 1 tablet (40 mg total) by mouth daily as needed (for heartburn). 30 tablet 2   triamterene-hydrochlorothiazide (MAXZIDE-25) 37.5-25 MG tablet Take 1  tablet by mouth once daily. 90 tablet 3   No current facility-administered medications on file prior to visit.    Family History  Problem Relation Age of Onset   Appendicitis Father        deceased   Dementia Mother    Colon cancer Neg Hx    Esophageal cancer Neg Hx    Rectal cancer Neg Hx    Stomach cancer Neg Hx    Pancreatic cancer Neg Hx    Breast cancer Neg Hx     Social History   Socioeconomic History   Marital status: Single    Spouse name: Not on file   Number of children: 3   Years of education: Not on file   Highest education level: High school graduate  Occupational History   Not on file  Tobacco Use   Smoking status: Former    Current packs/day: 0.50    Types: Cigarettes   Smokeless tobacco: Former    Quit date: 03/29/2013   Tobacco comments:    already quit on 03/29/13   Vaping Use   Vaping status: Never Used  Substance and Sexual Activity   Alcohol use: No    Alcohol/week: 0.0 standard drinks of alcohol   Drug use: No   Sexual  activity: Not Currently  Other Topics Concern   Not on file  Social History Narrative   Divorced   3 children   Works at [employer] as a Production assistant, 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 at [employer] and has [facility] card, [employer] September 24, 2010 4.31pm            Social Determinants of Health   Financial Resource Strain: Low Risk  (09/16/2022)   Overall Financial Resource Strain (CARDIA)    Difficulty of Paying Living Expenses: Not hard at all  Food Insecurity: No Food Insecurity (09/16/2022)   Hunger Vital Sign    Worried About Running Out of Food in the Last Year: Never true    Ran Out of Food in the Last Year: Never true  Transportation Needs: No Transportation Needs (09/16/2022)   PRAPARE - Administrator, Civil Service (Medical): No    Lack of Transportation (Non-Medical): No  Physical Activity: Insufficiently Active (09/16/2022)   Exercise Vital Sign    Days of Exercise per Week:  3 days    Minutes of Exercise per Session: 30 min  Stress: No Stress Concern Present (09/16/2022)   [employer] of Occupational Health - Occupational Stress Questionnaire    Feeling of Stress : Not at all  Social Connections: Moderately Isolated (09/16/2022)   Social Connection and Isolation Panel [NHANES]    Frequency of Communication with Friends and Family: More than three times a week    Frequency of Social Gatherings with Friends and Family: More than three times a week    Attends Religious Services: More than 4 times per year    Active Member of [employer] or Organizations: No    Attends Banker Meetings: Never    Marital Status: Separated  Intimate Partner Violence: Not At Risk (09/16/2022)   Humiliation, Afraid, Rape, and Kick questionnaire    Fear of Current or Ex-Partner: No    Emotionally Abused: No    Physically Abused: No    Sexually Abused: No    Review of Systems: ROS negative except for what is noted on the assessment and plan.  Objective:   Vitals:   04/06/23 0911  BP: 130/67  Pulse: 80  Temp: 97.8 F (36.6 C)  TempSrc: Oral  SpO2: 100%  Weight: 192 lb (87.1 kg)    Physical Exam: Constitutional: well-appearing  Cardiovascular: regular rate and rhythm, no m/r/g Pulmonary/Chest: normal work of breathing on room air, lungs clear to auscultation bilaterally Abdominal: soft, non-tender, non-distended MSK: normal bulk and tone  Assessment & Plan:  Type 2 diabetes mellitus (HCC) Metformin 500 BID restarted 4/24 Lab Results  Component Value Date   HGBA1C 7.1 (A) 01/05/2023  She reports adherence with medications. Repeat A1c today improved to 6.2. P: Follow-up in 6 months to repeat A1c  GERD (gastroesophageal reflux disease) She continues to have symptoms on days that she does not take Pantoprazole 40 mg. Her symptoms are well controlled when she takes PPI.  Hyperlipidemia Goal for primary prevention. Last LDL 9 months ago at 115. She reports  adherence to Atorvastatin 20 mg. Lab Results  Component Value Date   CHOL 218 (H) 06/19/2022   HDL 90 06/19/2022   LDLCALC 115 (H) 06/19/2022   TRIG 77 06/19/2022   CHOLHDL 2.4 06/19/2022   P: Check lipid panel  Preventative health care Received td     Patient discussed with Dr. Percell Boston Tedra Coppernoll, D.O. [place]  Internal Medicine  PGY-3 Pager: [phone]  Phone: [phone] Date 04/06/2023  Time 5:31 PM

## 2023-04-07 LAB — LIPID PANEL
Chol/HDL Ratio: 1.9 ratio (ref 0.0–4.4)
Cholesterol, Total: 171 mg/dL (ref 100–199)
HDL: 91 mg/dL (ref >39)
LDL Chol Calc (NIH): 66 mg/dL (ref 0–99)
Triglycerides: 78 mg/dL (ref 0–149)
VLDL Cholesterol Cal: 14 mg/dL (ref 5–40)

## 2023-04-08 NOTE — Progress Notes (Signed)
Internal Medicine Clinic Attending  Case discussed with the resident at the time of the visit.  We reviewed the resident's history and exam and pertinent patient test results.  I agree with the assessment, diagnosis, and plan of care documented in the resident's note.  

## 2023-04-09 ENCOUNTER — Other Ambulatory Visit: Payer: Self-pay

## 2023-04-09 ENCOUNTER — Other Ambulatory Visit: Payer: Self-pay | Admitting: Student

## 2023-04-09 DIAGNOSIS — E119 Type 2 diabetes mellitus without complications: Secondary | ICD-10-CM

## 2023-04-09 MED ORDER — METFORMIN HCL 500 MG PO TABS
500.0000 mg | ORAL_TABLET | Freq: Two times a day (BID) | ORAL | 0 refills | Status: DC
  Filled 2023-04-09: qty 180, 90d supply, fill #0

## 2023-04-10 ENCOUNTER — Other Ambulatory Visit: Payer: Self-pay

## 2023-04-21 ENCOUNTER — Other Ambulatory Visit: Payer: Self-pay

## 2023-04-24 ENCOUNTER — Other Ambulatory Visit: Payer: Self-pay

## 2023-05-20 ENCOUNTER — Other Ambulatory Visit: Payer: Self-pay

## 2023-05-21 ENCOUNTER — Other Ambulatory Visit: Payer: Self-pay

## 2023-06-10 ENCOUNTER — Other Ambulatory Visit: Payer: Self-pay

## 2023-06-11 ENCOUNTER — Other Ambulatory Visit: Payer: Self-pay

## 2023-06-17 ENCOUNTER — Other Ambulatory Visit: Payer: Self-pay

## 2023-06-17 ENCOUNTER — Other Ambulatory Visit: Payer: Self-pay | Admitting: Student

## 2023-06-18 ENCOUNTER — Other Ambulatory Visit: Payer: Self-pay | Admitting: Student

## 2023-06-18 ENCOUNTER — Other Ambulatory Visit: Payer: Self-pay

## 2023-06-19 ENCOUNTER — Other Ambulatory Visit: Payer: Self-pay

## 2023-06-19 MED ORDER — PANTOPRAZOLE SODIUM 40 MG PO TBEC
40.0000 mg | DELAYED_RELEASE_TABLET | Freq: Every day | ORAL | 2 refills | Status: DC | PRN
  Filled 2023-06-19: qty 30, 30d supply, fill #0
  Filled 2023-07-20: qty 30, 30d supply, fill #1
  Filled 2023-08-19: qty 30, 30d supply, fill #2

## 2023-06-22 ENCOUNTER — Other Ambulatory Visit: Payer: Self-pay

## 2023-07-01 ENCOUNTER — Other Ambulatory Visit: Payer: Self-pay

## 2023-07-03 ENCOUNTER — Other Ambulatory Visit: Payer: Self-pay

## 2023-07-06 ENCOUNTER — Other Ambulatory Visit: Payer: Self-pay

## 2023-07-06 DIAGNOSIS — I10 Essential (primary) hypertension: Secondary | ICD-10-CM

## 2023-07-06 MED ORDER — CARVEDILOL 6.25 MG PO TABS
6.2500 mg | ORAL_TABLET | Freq: Two times a day (BID) | ORAL | 3 refills | Status: DC
  Filled 2023-07-06: qty 180, 90d supply, fill #0
  Filled 2023-11-17: qty 180, 90d supply, fill #1
  Filled 2024-04-13: qty 180, 90d supply, fill #2

## 2023-07-07 ENCOUNTER — Other Ambulatory Visit: Payer: Self-pay

## 2023-07-07 ENCOUNTER — Other Ambulatory Visit: Payer: Self-pay | Admitting: Student

## 2023-07-07 DIAGNOSIS — E119 Type 2 diabetes mellitus without complications: Secondary | ICD-10-CM

## 2023-07-07 MED ORDER — METFORMIN HCL 500 MG PO TABS
500.0000 mg | ORAL_TABLET | Freq: Two times a day (BID) | ORAL | 0 refills | Status: DC
Start: 2023-07-07 — End: 2023-10-09
  Filled 2023-07-07: qty 180, 90d supply, fill #0

## 2023-07-09 ENCOUNTER — Other Ambulatory Visit: Payer: Self-pay

## 2023-07-10 ENCOUNTER — Other Ambulatory Visit: Payer: Self-pay

## 2023-07-13 ENCOUNTER — Other Ambulatory Visit: Payer: Self-pay

## 2023-07-23 ENCOUNTER — Other Ambulatory Visit: Payer: Self-pay

## 2023-08-20 ENCOUNTER — Other Ambulatory Visit: Payer: Self-pay

## 2023-09-14 ENCOUNTER — Other Ambulatory Visit: Payer: Self-pay | Admitting: Student

## 2023-09-14 ENCOUNTER — Other Ambulatory Visit: Payer: Self-pay

## 2023-09-14 ENCOUNTER — Other Ambulatory Visit: Payer: Self-pay | Admitting: Internal Medicine

## 2023-09-14 DIAGNOSIS — I10 Essential (primary) hypertension: Secondary | ICD-10-CM

## 2023-09-14 MED ORDER — PANTOPRAZOLE SODIUM 40 MG PO TBEC
40.0000 mg | DELAYED_RELEASE_TABLET | Freq: Every day | ORAL | 2 refills | Status: DC | PRN
  Filled 2023-09-14: qty 30, 30d supply, fill #0
  Filled 2023-10-19: qty 30, 30d supply, fill #1

## 2023-09-14 MED ORDER — AMLODIPINE BESYLATE 10 MG PO TABS
10.0000 mg | ORAL_TABLET | Freq: Every day | ORAL | 3 refills | Status: DC
  Filled 2023-09-14: qty 90, 90d supply, fill #0
  Filled 2023-12-15: qty 90, 90d supply, fill #1
  Filled 2024-03-15: qty 90, 90d supply, fill #2
  Filled 2024-06-15: qty 90, 90d supply, fill #3

## 2023-09-15 ENCOUNTER — Other Ambulatory Visit: Payer: Self-pay

## 2023-09-17 ENCOUNTER — Other Ambulatory Visit: Payer: Self-pay

## 2023-09-27 ENCOUNTER — Other Ambulatory Visit: Payer: Self-pay | Admitting: Student

## 2023-09-27 DIAGNOSIS — E785 Hyperlipidemia, unspecified: Secondary | ICD-10-CM

## 2023-10-07 ENCOUNTER — Encounter: Payer: No Typology Code available for payment source | Admitting: Student

## 2023-10-09 ENCOUNTER — Other Ambulatory Visit: Payer: Self-pay | Admitting: Student

## 2023-10-09 ENCOUNTER — Other Ambulatory Visit: Payer: Self-pay

## 2023-10-09 DIAGNOSIS — E119 Type 2 diabetes mellitus without complications: Secondary | ICD-10-CM

## 2023-10-09 MED ORDER — METFORMIN HCL 500 MG PO TABS
500.0000 mg | ORAL_TABLET | Freq: Two times a day (BID) | ORAL | 0 refills | Status: DC
  Filled 2023-10-09: qty 180, 90d supply, fill #0

## 2023-10-13 ENCOUNTER — Other Ambulatory Visit: Payer: Self-pay

## 2023-10-19 ENCOUNTER — Other Ambulatory Visit: Payer: Self-pay

## 2023-10-19 ENCOUNTER — Encounter: Payer: No Typology Code available for payment source | Admitting: Student

## 2023-10-19 NOTE — Progress Notes (Deleted)
   Established Patient Office Visit  Subjective   Patient ID: Cassandra Park, female    DOB: [birth year]  Age: 63 y.o. MRN: [id number]  No chief complaint on file.   HPI  {History (Optional):23778}  ROS    Objective:     LMP 02/28/2012  {Vitals History (Optional):23777}  Physical Exam   No results found for any visits on 10/19/23.  {Labs (Optional):23779}  The 10-year ASCVD risk score (Arnett DK, et al., 2019) is: 16.7%    Assessment & Plan:   Problem List Items Addressed This Visit   None  T2DM   No follow-ups on file.    Faith Rogue, DO

## 2023-10-27 ENCOUNTER — Encounter: Payer: No Typology Code available for payment source | Admitting: Student

## 2023-11-02 ENCOUNTER — Other Ambulatory Visit (HOSPITAL_COMMUNITY): Payer: Self-pay

## 2023-11-02 ENCOUNTER — Ambulatory Visit: Payer: Medicare (Managed Care) | Admitting: Student

## 2023-11-02 VITALS — BP 127/65 | HR 81 | Temp 98.7°F | Ht 69.0 in | Wt 192.1 lb

## 2023-11-02 DIAGNOSIS — K219 Gastro-esophageal reflux disease without esophagitis: Secondary | ICD-10-CM

## 2023-11-02 DIAGNOSIS — E119 Type 2 diabetes mellitus without complications: Secondary | ICD-10-CM

## 2023-11-02 DIAGNOSIS — Z1211 Encounter for screening for malignant neoplasm of colon: Secondary | ICD-10-CM

## 2023-11-02 DIAGNOSIS — E785 Hyperlipidemia, unspecified: Secondary | ICD-10-CM | POA: Diagnosis not present

## 2023-11-02 DIAGNOSIS — I10 Essential (primary) hypertension: Secondary | ICD-10-CM | POA: Diagnosis not present

## 2023-11-02 DIAGNOSIS — Z0000 Encounter for general adult medical examination without abnormal findings: Secondary | ICD-10-CM

## 2023-11-02 DIAGNOSIS — Z7984 Long term (current) use of oral hypoglycemic drugs: Secondary | ICD-10-CM

## 2023-11-02 LAB — POCT GLYCOSYLATED HEMOGLOBIN (HGB A1C): Hemoglobin A1C: 6.6 % — AB (ref 4.0–5.6)

## 2023-11-02 LAB — GLUCOSE, CAPILLARY: Glucose-Capillary: 125 mg/dL — ABNORMAL HIGH (ref 70–99)

## 2023-11-02 MED ORDER — PANTOPRAZOLE SODIUM 40 MG PO TBEC
40.0000 mg | DELAYED_RELEASE_TABLET | Freq: Every day | ORAL | 2 refills | Status: DC | PRN
Start: 2023-11-02 — End: 2024-02-15
  Filled 2023-11-02 – 2023-11-17 (×3): qty 30, 30d supply, fill #0
  Filled 2023-12-15: qty 30, 30d supply, fill #1
  Filled 2024-01-15: qty 30, 30d supply, fill #2

## 2023-11-02 MED ORDER — ATORVASTATIN CALCIUM 20 MG PO TABS
20.0000 mg | ORAL_TABLET | Freq: Every day | ORAL | 3 refills | Status: AC
Start: 2023-11-02 — End: ?
  Filled 2023-11-02 – 2024-02-05 (×3): qty 90, 90d supply, fill #0
  Filled 2024-06-15: qty 90, 90d supply, fill #1

## 2023-11-02 MED ORDER — METFORMIN HCL 1000 MG PO TABS
1000.0000 mg | ORAL_TABLET | Freq: Two times a day (BID) | ORAL | 3 refills | Status: AC
Start: 2023-11-09 — End: ?
  Filled 2023-11-02 – 2023-11-03 (×2): qty 180, 90d supply, fill #0
  Filled 2024-02-01: qty 180, 90d supply, fill #1
  Filled 2024-02-02: qty 180, 90d supply, fill #0

## 2023-11-02 MED ORDER — TRIAMTERENE-HCTZ 37.5-25 MG PO TABS
1.0000 | ORAL_TABLET | Freq: Every day | ORAL | 3 refills | Status: AC
  Filled 2023-11-02 – 2024-03-15 (×4): qty 90, 90d supply, fill #0
  Filled 2024-06-15: qty 90, 90d supply, fill #1
  Filled 2024-09-13: qty 90, 90d supply, fill #2

## 2023-11-02 NOTE — Progress Notes (Signed)
CC:  Chief Complaint  Patient presents with   Follow-up    Routine office visit with medication refill / dm   HPI:  [name] is a 63 y.o. female living with a history stated below and presents today for Fu on diabetes and HTN. Please see problem based assessment and plan for additional details.  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 2 pap smears since then normal. [facility]   HTN (hypertension)    Hx of colonic polyp 03/12/2017   7 mm hyperplastic transverse recall 2023   Hyperlipidemia    Keratoconus    f/u @ [facility] eye center. Not needing corneal transplant.    Osteoarthritis    Left hip   Substance abuse (HCC)    tobacco, alcohol   Vaginal bleeding, abnormal June 2010   Thought to be 2/2 DUB vs endometrial CA. Gyn referral made at that time.    Current Outpatient Medications on File Prior to Visit  Medication Sig Dispense Refill   amLODipine (NORVASC) 10 MG tablet Take 1 tablet (10 mg total) by mouth once daily. 90 tablet 3   b complex vitamins capsule Take 1 capsule by mouth daily. 90 capsule 2   calcium-vitamin D (OSCAL WITH D 500-200) 500-200 MG-UNIT per tablet Take 1 tablet by mouth 2 (two) times daily.     carvedilol (COREG) 6.25 MG tablet Take 1 tablet (6.25 mg total) by mouth 2 (two) times daily with a meal. 180 tablet 3   No current facility-administered medications on file prior to visit.    Family History  Problem Relation Age of Onset   Appendicitis Father        deceased   Dementia Mother    Colon cancer Neg Hx    Esophageal cancer Neg Hx    Rectal cancer Neg Hx    Stomach cancer Neg Hx    Pancreatic cancer Neg Hx    Breast cancer Neg Hx     Social History   Socioeconomic History   Marital status: Single    Spouse  name: Not on file   Number of children: 3   Years of education: Not on file   Highest education level: High school graduate  Occupational History   Not on file  Tobacco Use   Smoking status: Former    Current packs/day: 0.50    Types: Cigarettes   Smokeless tobacco: Former    Quit date: 03/29/2013   Tobacco comments:    already quit on 03/29/13   Vaping Use   Vaping status: Never Used  Substance and Sexual Activity   Alcohol use: No    Alcohol/week: 0.0 standard drinks of alcohol   Drug use: No   Sexual activity: Not Currently  Other Topics Concern   Not on file  Social History Narrative   Divorced   3 children   Works at [employer] as a Production assistant, 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 at [facility] and has [facility] card, [employer] September 24, 2010 4.31pm            Social Drivers of Health   Financial Resource Strain: Low Risk  (09/16/2022)   Overall Financial  Resource Strain (CARDIA)    Difficulty of Paying Living Expenses: Not hard at all  Food Insecurity: No Food Insecurity (09/16/2022)   Hunger Vital Sign    Worried About Running Out of Food in the Last Year: Never true    Ran Out of Food in the Last Year: Never true  Transportation Needs: No Transportation Needs (09/16/2022)   PRAPARE - Administrator, Civil Service (Medical): No    Lack of Transportation (Non-Medical): No  Physical Activity: Insufficiently Active (09/16/2022)   Exercise Vital Sign    Days of Exercise per Week: 3 days    Minutes of Exercise per Session: 30 min  Stress: No Stress Concern Present (09/16/2022)   [employer] of Occupational Health - Occupational Stress Questionnaire    Feeling of Stress : Not at all  Social Connections: Moderately Isolated (09/16/2022)   Social Connection and Isolation Panel [NHANES]    Frequency of Communication with Friends and Family: More than three times a week    Frequency of Social Gatherings with Friends and Family: More than  three times a week    Attends Religious Services: More than 4 times per year    Active Member of [employer] or Organizations: No    Attends Banker Meetings: Never    Marital Status: Separated  Intimate Partner Violence: Not At Risk (09/16/2022)   Humiliation, Afraid, Rape, and Kick questionnaire    Fear of Current or Ex-Partner: No    Emotionally Abused: No    Physically Abused: No    Sexually Abused: No    Review of Systems: ROS negative except for what is noted on the assessment and plan.  Vitals:   11/02/23 0850  BP: 127/65  Pulse: 81  Temp: 98.7 F (37.1 C)  TempSrc: Oral  SpO2: 100%  Weight: 192 lb 1.6 oz (87.1 kg)  Height: 5\' 9"  (1.753 m)    Physical Exam: Constitutional: well-appearing in NAD HENT: normocephalic atraumatic, mucous membranes moist Eyes: conjunctiva non-erythematous Cardiovascular: regular rate and rhythm, no m/r/g Pulmonary/Chest: normal work of breathing on room air, lungs clear to auscultation bilaterally Abdominal: soft, non-tender, non-distended MSK: normal bulk and tone Neurological: alert & oriented x 3, no focal deficit Skin: warm and dry Psych: normal mood and behavior  Assessment & Plan:   Patient discussed with Dr.  Deirdre Priest  Essential hypertension On amlodipine 10mg  daily and triamterene-hydrochlorothiazide 37.5mg -25mg , coreg 6.25mg  daily at goal 127/65. Reports no dizziness, feeling weak or tired, no chest pain or palpitations. Pt to continue taking the same.   GERD (gastroesophageal reflux disease) Continued to have GERD symptoms. Cw Pantoprazole 4mg  daily. Refill sent.   Type 2 diabetes mellitus (HCC) Currently on Metformin 500mg  BID, reports taking this medication as prescribed. Trend for Hgb A1C is below  Lab Results  Component Value Date   HGBA1C 6.6 (A) 11/02/2023   HGBA1C 6.2 (A) 04/06/2023   HGBA1C 7.1 (A) 01/05/2023   Increase from last visit, but stable. Has been tolerating metfoming with no GI side  effects. Would increase Metformin aiming for 1000mg  BID for next visit, going up to 1000mg  in the AM and 500mg  at night for 7 days and then increase to 1000mg  BID.  FU in 3 months. Consider GLP-1 agonist if patient agreeable to at next visit.   Optho referral not due since she last saw them in August of 2024.   Hyperlipidemia ASCVD risk is 15.8%. Currently on Lipitor 20mg  daily. With T2DM pt is to continue with  same.   Preventative health care Due for colonoscopy. Referral has already been sent.    Manuela Neptune, MD [facility] Internal Medicine, PGY-1 Phone: [phone] Date 11/02/2023 Time 11:35 AM

## 2023-11-02 NOTE — Patient Instructions (Signed)
Thank you, Ms.KENDEL PESNELL for allowing [place] to provide your care today.  I have ordered the following labs for you:  Lab Orders         Microalbumin / Creatinine Urine Ratio         Glucose, capillary         BMP8+Anion Gap         POC Hbg A1C       Referrals ordered today:   Referral Orders         Ambulatory referral to Gastroenterology      I have ordered the following medication/changed the following medications:   Stop the following medications: Medications Discontinued During This Encounter  Medication Reason   cetirizine (ZYRTEC) 10 MG tablet    triamterene-hydrochlorothiazide (MAXZIDE-25) 37.5-25 MG tablet Reorder   pantoprazole (PROTONIX) 40 MG tablet Reorder   atorvastatin (LIPITOR) 20 MG tablet Reorder   metFORMIN (GLUCOPHAGE) 500 MG tablet Reorder     Start the following medications: Meds ordered this encounter  Medications   atorvastatin (LIPITOR) 20 MG tablet    Sig: Take 1 tablet (20 mg total) by mouth daily.    Dispense:  90 tablet    Refill:  3   triamterene-hydrochlorothiazide (MAXZIDE-25) 37.5-25 MG tablet    Sig: Take 1 tablet by mouth once daily.    Dispense:  90 tablet    Refill:  3   pantoprazole (PROTONIX) 40 MG tablet    Sig: Take 1 tablet (40 mg total) by mouth daily as needed (for heartburn).    Dispense:  30 tablet    Refill:  2   metFORMIN (GLUCOPHAGE) 1000 MG tablet    Sig: Take 1 tablet (1,000 mg total) by mouth 2 (two) times daily with a meal.    Dispense:  180 tablet    Refill:  3    IM program     Follow up: 3 months    Remember:   For your metformin:   Start taking 2 tablets (1000mg ) in the morning and then 1 tablet (500mg ) at night for 7 days.  After that, take 1000mg  twice a day it will be 2 tablets of your current bottle because you have the 500mg  tablets but once you run out of this bottle, the new bottle will have a pill that is 1000mg .  Once you get your new bottle, just take ONE tablet in the morning and ONE tablet  at night for a total of 1000mg  daily.   Should you have any questions or concerns please call the internal medicine clinic at [phone].     Manuela Neptune, MD [facility] Center

## 2023-11-02 NOTE — Assessment & Plan Note (Addendum)
Currently on Metformin 500mg  BID, reports taking this medication as prescribed. Trend for Hgb A1C is below  Lab Results  Component Value Date   HGBA1C 6.6 (A) 11/02/2023   HGBA1C 6.2 (A) 04/06/2023   HGBA1C 7.1 (A) 01/05/2023   Increase from last visit, but stable. Has been tolerating metfoming with no GI side effects. Would increase Metformin aiming for 1000mg  BID for next visit, going up to 1000mg  in the AM and 500mg  at night for 7 days and then increase to 1000mg  BID.  FU in 3 months. Consider GLP-1 agonist if patient agreeable to at next visit.   Optho referral not due since she last saw them in August of 2024.

## 2023-11-02 NOTE — Assessment & Plan Note (Addendum)
On amlodipine 10mg  daily and triamterene-hydrochlorothiazide 37.5mg -25mg , coreg 6.25mg  daily at goal 127/65. Reports no dizziness, feeling weak or tired, no chest pain or palpitations. Pt to continue taking the same.

## 2023-11-02 NOTE — Assessment & Plan Note (Signed)
Continued to have GERD symptoms. Cw Pantoprazole 4mg  daily. Refill sent.

## 2023-11-02 NOTE — Assessment & Plan Note (Addendum)
Due for colonoscopy. Referral has already been sent.

## 2023-11-02 NOTE — Assessment & Plan Note (Signed)
ASCVD risk is 15.8%. Currently on Lipitor 20mg  daily. With T2DM pt is to continue with same.

## 2023-11-02 NOTE — Progress Notes (Signed)
 Internal Medicine Clinic Attending  Case discussed with the resident at the time of the visit.  We reviewed the resident's history and exam and pertinent patient test results.  I agree with the assessment, diagnosis, and plan of care documented in the resident's note.

## 2023-11-03 ENCOUNTER — Other Ambulatory Visit (HOSPITAL_COMMUNITY): Payer: Self-pay

## 2023-11-03 ENCOUNTER — Other Ambulatory Visit: Payer: Self-pay

## 2023-11-03 LAB — BMP8+ANION GAP
Anion Gap: 17 mmol/L (ref 10.0–18.0)
BUN/Creatinine Ratio: 14 (ref 12–28)
BUN: 10 mg/dL (ref 8–27)
CO2: 24 mmol/L (ref 20–29)
Calcium: 9.9 mg/dL (ref 8.7–10.3)
Chloride: 97 mmol/L (ref 96–106)
Creatinine, Ser: 0.7 mg/dL (ref 0.57–1.00)
Glucose: 120 mg/dL — ABNORMAL HIGH (ref 70–99)
Potassium: 3.6 mmol/L (ref 3.5–5.2)
Sodium: 138 mmol/L (ref 134–144)
eGFR: 96 mL/min/{1.73_m2} (ref >59)

## 2023-11-03 LAB — MICROALBUMIN / CREATININE URINE RATIO
Creatinine, Urine: 132.8 mg/dL
Microalb/Creat Ratio: 21 mg/g{creat} (ref 0–29)
Microalbumin, Urine: 27.8 ug/mL

## 2023-11-03 NOTE — Progress Notes (Signed)
Cr stable. CTM.

## 2023-11-17 ENCOUNTER — Other Ambulatory Visit: Payer: Self-pay

## 2023-11-17 ENCOUNTER — Other Ambulatory Visit (HOSPITAL_COMMUNITY): Payer: Self-pay

## 2023-11-19 ENCOUNTER — Other Ambulatory Visit: Payer: Self-pay

## 2023-12-15 ENCOUNTER — Other Ambulatory Visit: Payer: Self-pay

## 2023-12-16 ENCOUNTER — Other Ambulatory Visit: Payer: Self-pay

## 2023-12-16 ENCOUNTER — Other Ambulatory Visit (HOSPITAL_COMMUNITY): Payer: Self-pay

## 2023-12-17 ENCOUNTER — Other Ambulatory Visit: Payer: Self-pay

## 2023-12-28 ENCOUNTER — Encounter: Payer: Self-pay | Admitting: Student

## 2023-12-28 ENCOUNTER — Other Ambulatory Visit: Payer: Self-pay | Admitting: Internal Medicine

## 2023-12-28 ENCOUNTER — Telehealth: Payer: Self-pay | Admitting: Student

## 2023-12-28 DIAGNOSIS — Z1231 Encounter for screening mammogram for malignant neoplasm of breast: Secondary | ICD-10-CM

## 2023-12-28 NOTE — Telephone Encounter (Signed)
 Rec'd a call from [facility].  Pt is requesting to get her yearly Mammogram scheduled with [facility] which is due in May  Please place a a New order for this patient and we will get her Sch.   Last mammogram was   MM 3D SCREENING MAMMOGRAM BILATERAL BREAST (Accession [id number]) (Order [id number]) Imaging Date: 01/29/2023 Department: [facility] Mobile Mammography   Authorizing: Priscella Brooms, DO

## 2023-12-28 NOTE — Telephone Encounter (Signed)
 Spoke with the patient again and she is aware of the following and will wait fro our office to cal her back with the new Facility to use.  A new order will need to be placed again for an "External Order",  as the pt's Ins per verification will not be accepted at [facility] last [facility].  Please place an External Order Instead of the current one.    Copied from CRM [id number]. Topic: Appointments - Scheduling Inquiry for Clinic >> Dec 28, 2023  8:53 AM Jayson Michael wrote: Reason for CRM: Patient called regarding difficulty scheduling a mammogram. She stated the mammogram clinic informed her that her PCP was not listed. Contacted [facility], who advised that the mammogram clinic does not accept orders from residents and that an attending provider must place the order. Patient was informed that an order would be submitted by an attending and that the clinic would contact her directly to schedule once the order is in place.Please have an attending provider place the order so the patient can proceed with scheduling. >> Dec 28, 2023  1:32 PM Tiffany H wrote: Patient has [employer] [place]. Patient advised that [employer] requires Prior Authorization for Mammogram. Patient has Medicaid Secondary. Patient was told that [facility] is out of network. Please follow up with [employer] and Medicaid:   [employer]: [phone]

## 2024-01-04 ENCOUNTER — Telehealth: Payer: Self-pay | Admitting: *Deleted

## 2024-01-04 NOTE — Telephone Encounter (Signed)
 I called pt who stated she needs a referral for a mammogram to who will accept her insurance. Sending to Chilon to f/u.

## 2024-01-04 NOTE — Telephone Encounter (Signed)
 Copied from CRM [id number]. Topic: General - Other >> Jan 04, 2024  8:43 AM Shelby Dessert H wrote: Reason for CRM: Patient called and wanted an update on your mammogram, went through the notes and gave the patient a summary of what's going on, patient would like a call back once her mammogram is made or an update, patients callback number is [phone]

## 2024-01-11 ENCOUNTER — Telehealth: Payer: Self-pay | Admitting: *Deleted

## 2024-01-11 NOTE — Telephone Encounter (Unsigned)
 Copied from CRM [id number]. Topic: General - Other >> Jan 04, 2024  8:43 AM Shelby Dessert H wrote: Reason for CRM: Patient called and wanted an update on your mammogram, went through the notes and gave the patient a summary of what's going on, patient would like a call back once her mammogram is made or an update, patients callback number is [phone] >> Jan 11, 2024  9:16 AM Valeri Gate H wrote: Patient is calling back for update on mammogram. Says no one has called her back. I let patient know mammogram order is to be faxed to [facility] per her ins plan (per Chilon's note to Mrs. Stevens Eland). Patient asked is that in [place]? Says she doesn't drive and wanted to know if it could be sent someone closer. Patient would like a call back today.

## 2024-01-14 ENCOUNTER — Telehealth: Payer: Self-pay | Admitting: *Deleted

## 2024-01-14 NOTE — Telephone Encounter (Signed)
 Mammogram appointment May 13.2025 @ 8:30 am / [facility] on [address]

## 2024-01-18 ENCOUNTER — Other Ambulatory Visit: Payer: Self-pay

## 2024-01-29 ENCOUNTER — Encounter: Payer: No Typology Code available for payment source | Admitting: Student

## 2024-02-02 ENCOUNTER — Other Ambulatory Visit: Payer: Self-pay

## 2024-02-02 ENCOUNTER — Other Ambulatory Visit (HOSPITAL_COMMUNITY): Payer: Self-pay

## 2024-02-02 ENCOUNTER — Ambulatory Visit: Payer: Medicare (Managed Care)

## 2024-02-02 LAB — HM MAMMOGRAPHY

## 2024-02-05 ENCOUNTER — Other Ambulatory Visit: Payer: Self-pay

## 2024-02-09 ENCOUNTER — Encounter: Admitting: Student

## 2024-02-15 ENCOUNTER — Other Ambulatory Visit (HOSPITAL_COMMUNITY): Payer: Self-pay

## 2024-02-15 ENCOUNTER — Other Ambulatory Visit: Payer: Self-pay | Admitting: Student

## 2024-02-15 ENCOUNTER — Other Ambulatory Visit: Payer: Self-pay

## 2024-02-15 DIAGNOSIS — K219 Gastro-esophageal reflux disease without esophagitis: Secondary | ICD-10-CM

## 2024-02-15 MED ORDER — PANTOPRAZOLE SODIUM 40 MG PO TBEC
40.0000 mg | DELAYED_RELEASE_TABLET | Freq: Every day | ORAL | 2 refills | Status: DC | PRN
Start: 2024-02-15 — End: 2024-05-17
  Filled 2024-02-15: qty 30, 30d supply, fill #0
  Filled 2024-03-15: qty 30, 30d supply, fill #1
  Filled 2024-04-13: qty 30, 30d supply, fill #2

## 2024-02-15 NOTE — Telephone Encounter (Signed)
 Medication sent to pharmacy

## 2024-02-17 ENCOUNTER — Encounter: Payer: Self-pay | Admitting: Internal Medicine

## 2024-02-17 ENCOUNTER — Other Ambulatory Visit: Payer: Self-pay

## 2024-03-01 ENCOUNTER — Ambulatory Visit: Payer: Medicare (Managed Care) | Admitting: Student

## 2024-03-01 VITALS — BP 137/80 | HR 88 | Temp 97.9°F | Ht 69.0 in | Wt 181.4 lb

## 2024-03-01 DIAGNOSIS — Z7984 Long term (current) use of oral hypoglycemic drugs: Secondary | ICD-10-CM

## 2024-03-01 DIAGNOSIS — E785 Hyperlipidemia, unspecified: Secondary | ICD-10-CM

## 2024-03-01 DIAGNOSIS — E119 Type 2 diabetes mellitus without complications: Secondary | ICD-10-CM | POA: Diagnosis not present

## 2024-03-01 DIAGNOSIS — Z122 Encounter for screening for malignant neoplasm of respiratory organs: Secondary | ICD-10-CM

## 2024-03-01 DIAGNOSIS — Z13820 Encounter for screening for osteoporosis: Secondary | ICD-10-CM

## 2024-03-01 DIAGNOSIS — Z0000 Encounter for general adult medical examination without abnormal findings: Secondary | ICD-10-CM

## 2024-03-01 DIAGNOSIS — I10 Essential (primary) hypertension: Secondary | ICD-10-CM

## 2024-03-01 LAB — POCT GLYCOSYLATED HEMOGLOBIN (HGB A1C): Hemoglobin A1C: 5.7 % — AB (ref 4.0–5.6)

## 2024-03-01 LAB — GLUCOSE, CAPILLARY: Glucose-Capillary: 98 mg/dL (ref 70–99)

## 2024-03-01 MED ORDER — METFORMIN HCL 1000 MG PO TABS: ORAL_TABLET | ORAL | Status: DC

## 2024-03-01 NOTE — Assessment & Plan Note (Addendum)
 A1c 5.7. Excellent control on metformin  1000 mg BID. Because of the GI side-effects I'm reducing her morning dose to 500 mg, continue 1000 mg with supper.

## 2024-03-01 NOTE — Patient Instructions (Signed)
 Start taking 1/2 tablet of metformin  in the morning and a full tablet with supper.  You'll receive calls about bone density testing and lung cancer screening.  Remember to bring all of the medications that you take (including over the counter medications and supplements) with you to every clinic visit.  This after visit summary is an important review of tests, referrals, and medication changes that were discussed during your visit. If you have questions or concerns, call [phone]. Outside of clinic business hours, call the main hospital at [phone] and ask the operator for the on-call internal medicine resident.   Adria Hopkins MD 03/01/2024, 2:53 PM

## 2024-03-01 NOTE — Assessment & Plan Note (Signed)
 137/80 today. Last several encounter BP reviewed, range between mid 120 systolic to 140 systolic. She sees similar numbers at home when she checks, albeit infrequently. Never > 140 mmHg. Continue amlodipine  10 mg, carvedilol  6.25 mg BID, and triamterene -hydrochlorothiazide  37.5-25 mg daily.

## 2024-03-01 NOTE — Assessment & Plan Note (Signed)
 Cholesterol well-controlled on atorvastatin  20 mg.

## 2024-03-01 NOTE — Progress Notes (Signed)
 Patient name: Cassandra Park Date of birth: 1961/01/02 Date of visit: 03/01/24  Subjective   Chief concern: metformin  making me run to bathroom  Since increasing metformin  at last visit she has been having some diarrhea. She feels full after she takes it in the morning and then has diarrhea shortly thereafter.  Review of Systems  Constitutional:  Negative for malaise/fatigue and weight loss (intentional weight loss).  Respiratory:  Negative for cough and shortness of breath.   Cardiovascular:  Negative for chest pain.  Gastrointestinal:  Negative for abdominal pain, constipation, diarrhea and vomiting.  Genitourinary:  Negative for frequency and hematuria.  Musculoskeletal:  Negative for falls.  Psychiatric/Behavioral:  Negative for depression. The patient does not have insomnia.     Current Outpatient Medications  Medication Instructions   amLODipine  (NORVASC ) 10 MG tablet Take 1 tablet (10 mg total) by mouth once daily.   atorvastatin  (LIPITOR) 20 mg, Oral, Daily   b complex vitamins capsule 1 capsule, Oral, Daily   calcium -vitamin D (OSCAL WITH D 500-200) 500-200 MG-UNIT per tablet 1 tablet, Oral, 2 times daily,     carvedilol  (COREG ) 6.25 mg, Oral, 2 times daily with meals   metFORMIN  (GLUCOPHAGE ) 1,000 mg, Oral, 2 times daily with meals   pantoprazole  (PROTONIX ) 40 mg, Oral, Daily PRN   triamterene -hydrochlorothiazide  (MAXZIDE -25) 37.5-25 MG tablet Take 1 tablet by mouth once daily.     Objective  Today's Vitals   03/01/24 1417  BP: 137/80  Pulse: 88  Temp: 97.9 F (36.6 C)  TempSrc: Oral  SpO2: 91%  Weight: 181 lb 6.4 oz (82.3 kg)  Height: 5' 9 (1.753 m)  PainSc: 0-No pain  Body mass index is 26.79 kg/m.   Physical Exam Constitutional:      General: She is not in acute distress.    Appearance: Normal appearance.  HENT:     Right Ear: Tympanic membrane, ear canal and external ear normal.     Left Ear: Tympanic membrane, ear canal and external ear normal.      Mouth/Throat:     Mouth: Mucous membranes are moist.     Pharynx: No oropharyngeal exudate or posterior oropharyngeal erythema.   Eyes:     Extraocular Movements: Extraocular movements intact.     Conjunctiva/sclera: Conjunctivae normal.     Pupils: Pupils are equal, round, and reactive to light.   Neck:     Thyroid: No thyroid mass, thyromegaly or thyroid tenderness.     Vascular: No carotid bruit.   Cardiovascular:     Rate and Rhythm: Normal rate and regular rhythm.     Pulses: Normal pulses.     Heart sounds: Murmur (systolic) heard.  Pulmonary:     Effort: Pulmonary effort is normal.     Breath sounds: Normal breath sounds. No wheezing or rales.  Abdominal:     Palpations: Abdomen is soft. There is no hepatomegaly, splenomegaly or mass.     Tenderness: There is no abdominal tenderness.   Musculoskeletal:     Right lower leg: No edema.     Left lower leg: No edema.  Lymphadenopathy:     Cervical: No cervical adenopathy.   Skin:    General: Skin is warm and dry.   Neurological:     Mental Status: She is alert. Mental status is at baseline.     Cranial Nerves: No facial asymmetry.     Motor: No tremor.     Deep Tendon Reflexes: Reflexes normal.   Psychiatric:  Mood and Affect: Mood normal.        Behavior: Behavior normal.      Assessment & Plan  Problem List Items Addressed This Visit     Hyperlipidemia (Chronic)   Cholesterol well-controlled on atorvastatin  20 mg.      Essential hypertension (Chronic)   137/80 today. Last several encounter BP reviewed, range between mid 120 systolic to 140 systolic. She sees similar numbers at home when she checks, albeit infrequently. Never > 140 mmHg. Continue amlodipine  10 mg, carvedilol  6.25 mg BID, and triamterene -hydrochlorothiazide  37.5-25 mg daily.      Preventative health care (Chronic)   Referrals for DXA and low dose chest CT for lung cancer screening placed. She plans to make appointment for  follow-up colonoscopy on her own.      Type 2 diabetes mellitus (HCC) - Primary (Chronic)   A1c 5.7. Excellent control on metformin  1000 mg BID. Because of the GI side-effects I'm reducing her morning dose to 500 mg, continue 1000 mg with supper.      Relevant Medications   metFORMIN  (GLUCOPHAGE ) 1000 MG tablet   Other Relevant Orders   POC Hbg A1C (Completed)   Glucose, capillary (Completed)   Other Visit Diagnoses       Osteoporosis screening       Relevant Orders   DG Bone Density     Encounter for screening for lung cancer       Relevant Orders   CT CHEST LUNG CA SCREEN LOW DOSE W/O CM      Return in about 3 months (around 06/01/2024) for routine follow-up.  Adria Hopkins MD 03/01/2024, 4:01 PM

## 2024-03-01 NOTE — Assessment & Plan Note (Signed)
 Referrals for DXA and low dose chest CT for lung cancer screening placed. She plans to make appointment for follow-up colonoscopy on her own.

## 2024-03-10 NOTE — Progress Notes (Signed)
 Internal Medicine Clinic Attending  Case discussed with the resident at the time of the visit.  We reviewed the resident's history and exam and pertinent patient test results.  I agree with the assessment, diagnosis, and plan of care documented in the resident's note.

## 2024-03-16 ENCOUNTER — Other Ambulatory Visit: Payer: Self-pay

## 2024-03-17 ENCOUNTER — Other Ambulatory Visit: Payer: Self-pay

## 2024-04-14 ENCOUNTER — Other Ambulatory Visit: Payer: Self-pay

## 2024-04-22 ENCOUNTER — Ambulatory Visit (HOSPITAL_BASED_OUTPATIENT_CLINIC_OR_DEPARTMENT_OTHER): Payer: Medicare (Managed Care)

## 2024-05-17 ENCOUNTER — Other Ambulatory Visit: Payer: Self-pay | Admitting: Student

## 2024-05-17 ENCOUNTER — Other Ambulatory Visit (HOSPITAL_COMMUNITY): Payer: Self-pay

## 2024-05-17 ENCOUNTER — Other Ambulatory Visit: Payer: Self-pay

## 2024-05-17 DIAGNOSIS — K219 Gastro-esophageal reflux disease without esophagitis: Secondary | ICD-10-CM

## 2024-05-17 DIAGNOSIS — E119 Type 2 diabetes mellitus without complications: Secondary | ICD-10-CM

## 2024-05-17 MED ORDER — PANTOPRAZOLE SODIUM 40 MG PO TBEC
40.0000 mg | DELAYED_RELEASE_TABLET | Freq: Every day | ORAL | 2 refills | Status: DC | PRN
  Filled 2024-05-17: qty 30, 30d supply, fill #0
  Filled 2024-06-15: qty 30, 30d supply, fill #1
  Filled 2024-07-18: qty 30, 30d supply, fill #2

## 2024-05-17 NOTE — Telephone Encounter (Signed)
 Medication sent to pharmacy

## 2024-05-18 ENCOUNTER — Other Ambulatory Visit: Payer: Self-pay

## 2024-05-18 MED ORDER — METFORMIN HCL 1000 MG PO TABS
1000.0000 mg | ORAL_TABLET | Freq: Two times a day (BID) | ORAL | 3 refills | Status: DC
Start: 2024-05-18 — End: 2024-05-18
  Filled 2024-05-18: qty 180, 90d supply, fill #0

## 2024-05-19 ENCOUNTER — Other Ambulatory Visit: Payer: Self-pay | Admitting: Student

## 2024-05-19 ENCOUNTER — Other Ambulatory Visit: Payer: Self-pay

## 2024-05-19 DIAGNOSIS — E119 Type 2 diabetes mellitus without complications: Secondary | ICD-10-CM

## 2024-05-20 ENCOUNTER — Other Ambulatory Visit (HOSPITAL_COMMUNITY): Payer: Self-pay

## 2024-05-20 ENCOUNTER — Other Ambulatory Visit: Payer: Self-pay

## 2024-05-20 MED ORDER — METFORMIN HCL 1000 MG PO TABS
ORAL_TABLET | ORAL | 5 refills | Status: AC
  Filled 2024-05-20: qty 90, 60d supply, fill #0
  Filled 2024-07-18: qty 90, 60d supply, fill #1
  Filled 2024-09-13: qty 90, 60d supply, fill #2

## 2024-05-23 ENCOUNTER — Other Ambulatory Visit: Payer: Self-pay

## 2024-05-30 ENCOUNTER — Telehealth: Payer: Self-pay | Admitting: Student

## 2024-05-30 DIAGNOSIS — Z13820 Encounter for screening for osteoporosis: Secondary | ICD-10-CM

## 2024-05-30 NOTE — Telephone Encounter (Signed)
 Rec'd a message for [facility] they no longer can sch for Bone density Test.  Please place a new External Bone Density Test and we will get her sch as soon as possible.  The patient currently uses a [facility] site for her Mammograms due to her ins.

## 2024-05-30 NOTE — Telephone Encounter (Signed)
 A message has been sent to the pt's provider for a new External order to be placed.  Copied from CRM [id number]. Topic: Referral - Question >> May 20, 2024  8:17 AM Merlynn A wrote: Reason for CRM: Patient called in stating that she received phone call yesterday for bone density testing. Patient was advised by facility that they are no longer doing those tests. Please schedule patient for different location. Patient can be reached at [phone].

## 2024-06-01 ENCOUNTER — Ambulatory Visit: Payer: Medicare (Managed Care) | Admitting: Student

## 2024-06-01 VITALS — BP 129/71 | HR 83 | Temp 97.7°F | Ht 69.0 in | Wt 183.2 lb

## 2024-06-01 DIAGNOSIS — I10 Essential (primary) hypertension: Secondary | ICD-10-CM

## 2024-06-01 DIAGNOSIS — Z87891 Personal history of nicotine dependence: Secondary | ICD-10-CM

## 2024-06-01 DIAGNOSIS — Z7984 Long term (current) use of oral hypoglycemic drugs: Secondary | ICD-10-CM

## 2024-06-01 DIAGNOSIS — E119 Type 2 diabetes mellitus without complications: Secondary | ICD-10-CM

## 2024-06-01 DIAGNOSIS — Z79899 Other long term (current) drug therapy: Secondary | ICD-10-CM | POA: Diagnosis not present

## 2024-06-01 LAB — POCT GLYCOSYLATED HEMOGLOBIN (HGB A1C): HbA1c, POC (controlled diabetic range): 6 % (ref 0.0–7.0)

## 2024-06-01 LAB — GLUCOSE, CAPILLARY: Glucose-Capillary: 113 mg/dL — ABNORMAL HIGH (ref 70–99)

## 2024-06-01 NOTE — Addendum Note (Signed)
 Addended by: NORRINE SHARPER on: 06/01/2024 09:46 AM   Modules accepted: Orders

## 2024-06-01 NOTE — Progress Notes (Signed)
 Subjective:  CC: Diabetes follow up  HPI:  Cassandra Park is a 63 y.o. female with a past medical history stated below and presents today for diabetes and hypertension follow up. Please see problem based assessment and plan for additional details.  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 2 pap smears since then normal. [facility]07 [facility]   HTN (hypertension)    Hx of colonic polyp 03/12/2017   7 mm hyperplastic transverse recall 2023   Hyperlipidemia    Keratoconus    f/u @ [facility]. Not needing corneal transplant.    Osteoarthritis    Left hip   Substance abuse (HCC)    tobacco, alcohol   Vaginal bleeding, abnormal June 2010   Thought to be 2/2 DUB vs endometrial CA. Gyn referral made at that time.    Current Outpatient Medications on File Prior to Visit  Medication Sig Dispense Refill   amLODipine  (NORVASC ) 10 MG tablet Take 1 tablet (10 mg total) by mouth once daily. 90 tablet 3   atorvastatin  (LIPITOR) 20 MG tablet Take 1 tablet (20 mg total) by mouth daily. 90 tablet 3   b complex vitamins capsule Take 1 capsule by mouth daily. 90 capsule 2   calcium -vitamin D (OSCAL WITH D 500-200) 500-200 MG-UNIT per tablet Take 1 tablet by mouth 2 (two) times daily.     carvedilol  (COREG ) 6.25 MG tablet Take 1 tablet (6.25 mg total) by mouth 2 (two) times daily with a meal. 180 tablet 3   metFORMIN  (GLUCOPHAGE ) 1000 MG tablet Take 0.5 tablets (500 mg total) by mouth daily with breakfast AND 1 tablet (1,000 mg total) daily before supper. 90 tablet 5   pantoprazole  (PROTONIX ) 40 MG tablet Take 1 tablet (40 mg total) by mouth daily as needed (for heartburn). 30 tablet 2   triamterene -hydrochlorothiazide  (MAXZIDE -25) 37.5-25 MG tablet Take 1 tablet by mouth once daily.  90 tablet 3   No current facility-administered medications on file prior to visit.    Family History  Problem Relation Age of Onset   Appendicitis Father        deceased   Dementia Mother    Colon cancer Neg Hx    Esophageal cancer Neg Hx    Rectal cancer Neg Hx    Stomach cancer Neg Hx    Pancreatic cancer Neg Hx    Breast cancer Neg Hx     Social History   Socioeconomic History   Marital status: Single    Spouse name: Not on file   Number of children: 3   Years of education: Not on file   Highest education level: High school graduate  Occupational History   Not on file  Tobacco Use   Smoking status: Former    Current packs/day: 0.50    Types: Cigarettes   Smokeless tobacco: Former    Quit date: 03/29/2013   Tobacco comments:    already quit on 03/29/13   Vaping Use   Vaping status: Never Used  Substance and Sexual Activity   Alcohol use: No    Alcohol/week: 0.0 standard drinks of alcohol   Drug use: No   Sexual activity: Not Currently  Other Topics Concern   Not  on file  Social History Narrative   Divorced   3 children   Works at [employer] as a Production assistant, 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 and has [facility] card, [employer] September 24, 2010 4.31pm            Social Drivers of Corporate investment banker Strain: Low Risk  (09/16/2022)   Overall Financial Resource Strain (CARDIA)    Difficulty of Paying Living Expenses: Not hard at all  Food Insecurity: No Food Insecurity (09/16/2022)   Hunger Vital Sign    Worried About Running Out of Food in the Last Year: Never true    Ran Out of Food in the Last Year: Never true  Transportation Needs: No Transportation Needs (09/16/2022)   PRAPARE - Administrator, Civil Service (Medical): No    Lack of Transportation (Non-Medical): No  Physical Activity: Insufficiently Active (09/16/2022)   Exercise Vital Sign    Days of Exercise per Week: 3 days    Minutes of Exercise  per Session: 30 min  Stress: No Stress Concern Present (09/16/2022)   [employer] of Occupational Health - Occupational Stress Questionnaire    Feeling of Stress : Not at all  Social Connections: Moderately Isolated (09/16/2022)   Social Connection and Isolation Panel    Frequency of Communication with Friends and Family: More than three times a week    Frequency of Social Gatherings with Friends and Family: More than three times a week    Attends Religious Services: More than 4 times per year    Active Member of [employer] or Organizations: No    Attends Banker Meetings: Never    Marital Status: Separated  Intimate Partner Violence: Not At Risk (09/16/2022)   Humiliation, Afraid, Rape, and Kick questionnaire    Fear of Current or Ex-Partner: No    Emotionally Abused: No    Physically Abused: No    Sexually Abused: No    Review of Systems: ROS negative except for what is noted on the assessment and plan.  Objective:   Vitals:   06/01/24 1457 06/01/24 1534  BP: (!) 143/69 129/71  Pulse: 88 83  Temp: 97.7 F (36.5 C)   TempSrc: Oral   SpO2: 100%   Weight: 183 lb 3.2 oz (83.1 kg)   Height: 5' 9 (1.753 m)     Physical Exam: Constitutional: well-appearing woman sitting in chair, in no acute distress HENT: normocephalic atraumatic, mucous membranes moist Eyes: conjunctiva non-erythematous Neck: supple Cardiovascular: regular rate and rhythm, no m/r/g Pulmonary/Chest: normal work of breathing on room air, lungs clear to auscultation bilaterally Abdominal: soft, non-tender, non-distended MSK: normal bulk and tone Neurological: alert & oriented x 3, SkinL warm and dry Psych: Pleasant mood and affect       03/01/2024    2:20 PM  Depression screen PHQ 2/9  Decreased Interest 0  Down, Depressed, Hopeless 0  PHQ - 2 Score 0        No data to display           Assessment & Plan:   Essential hypertension Vitals:   06/01/24 1457 06/01/24 1534  BP: (!)  143/69 129/71      Latest Ref Rng & Units 11/02/2023    9:57 AM 06/19/2022   10:44 AM 02/24/2022    9:30 AM  BMP  Glucose 70 - 99 mg/dL 879  880  880   BUN 8 -  27 mg/dL 10  12  10    Creatinine 0.57 - 1.00 mg/dL 9.29  9.32  9.37   BUN/Creat Ratio 12 - 28 14  18  16    Sodium 134 - 144 mmol/L 138  139  138   Potassium 3.5 - 5.2 mmol/L 3.6  3.9  3.7   Chloride 96 - 106 mmol/L 97  97  98   CO2 20 - 29 mmol/L 24  25  25    Calcium  8.7 - 10.3 mg/dL 9.9  9.7  9.7     Stable on the following regimen: Carvedilol  6.25 mg bid  Amlodipine  10 mg daily Triamterene - hydrochlorothiazide  37.5 - 25 Statin 20 mg atorvastatin  - BMP at follow up in 6 months  Type 2 diabetes mellitus (HCC) Lab Results  Component Value Date   HGBA1C 6.0 06/01/2024   HGBA1C 5.7 (A) 03/01/2024   HGBA1C 6.6 (A) 11/02/2023   Has been doing 1000 mg daily. uACR 6 months ago wnl. On statin. - Follow up  6 months    Return in about 6 months (around 11/29/2024) for Diabetes and hypertension.  Patient discussed with Dr. Shawn Hadassah Kristy Rosario, MD [facility]  06/04/2024, 9:09 PM

## 2024-06-01 NOTE — Patient Instructions (Addendum)
 Thank you, Cassandra Park for allowing us  to provide your care today. Today we discussed   Your blood pressure and your diet Your diabetes - congratulations. Continue working on lifestyle modifications.    Referrals: - [name] will check on your referral to DEXA scan. Please call for your CT of the chest      I have ordered the following labs for you:   Lab Orders         Glucose, capillary         POC Hbg A1C      I will call if any are abnormal. All of your labs can be accessed through My Chart.   My Chart Access: [email or web address]  Please follow-up in: 6 months for diabetes and blood pressure check. You will need to call us  back    We look forward to seeing you next time. Please call our clinic at [phone] if you have any questions or concerns. The best time to call is Monday-Friday from 9am-4pm, but there is someone available 24/7. If after hours or the weekend, call the main hospital number and ask for the Internal Medicine Resident On-Call. If you need medication refills, please notify your pharmacy one week in advance and they will send us  a request.   Thank you for letting us  take part in your care. Wishing you the best!  [name], MD 06/01/2024, 3:20 PM [facility]

## 2024-06-04 ENCOUNTER — Encounter: Payer: Self-pay | Admitting: Student

## 2024-06-04 NOTE — Assessment & Plan Note (Signed)
 Vitals:   06/01/24 1457 06/01/24 1534  BP: (!) 143/69 129/71      Latest Ref Rng & Units 11/02/2023    9:57 AM 06/19/2022   10:44 AM 02/24/2022    9:30 AM  BMP  Glucose 70 - 99 mg/dL 879  880  880   BUN 8 - 27 mg/dL 10  12  10    Creatinine 0.57 - 1.00 mg/dL 9.29  9.32  9.37   BUN/Creat Ratio 12 - 28 14  18  16    Sodium 134 - 144 mmol/L 138  139  138   Potassium 3.5 - 5.2 mmol/L 3.6  3.9  3.7   Chloride 96 - 106 mmol/L 97  97  98   CO2 20 - 29 mmol/L 24  25  25    Calcium  8.7 - 10.3 mg/dL 9.9  9.7  9.7     Stable on the following regimen: Carvedilol  6.25 mg bid  Amlodipine  10 mg daily Triamterene - hydrochlorothiazide  37.5 - 25 Statin 20 mg atorvastatin  - BMP at follow up in 6 months

## 2024-06-04 NOTE — Assessment & Plan Note (Signed)
 Lab Results  Component Value Date   HGBA1C 6.0 06/01/2024   HGBA1C 5.7 (A) 03/01/2024   HGBA1C 6.6 (A) 11/02/2023   Has been doing 1000 mg daily. uACR 6 months ago wnl. On statin. - Follow up  6 months

## 2024-06-13 NOTE — Progress Notes (Signed)
 Internal Medicine Clinic Attending  Case discussed with the resident at the time of the visit.  We reviewed the resident's history and exam and pertinent patient test results.  I agree with the assessment, diagnosis, and plan of care documented in the resident's note.

## 2024-06-15 ENCOUNTER — Other Ambulatory Visit: Payer: Self-pay

## 2024-06-16 ENCOUNTER — Other Ambulatory Visit: Payer: Self-pay

## 2024-06-17 ENCOUNTER — Other Ambulatory Visit: Payer: Self-pay

## 2024-07-05 ENCOUNTER — Telehealth: Payer: Self-pay | Admitting: *Deleted

## 2024-07-05 NOTE — Telephone Encounter (Signed)
 Call from pt regarding appt for colonoscopy Pt was on recall with [facility] for 2023  Pt given contact number to [facility] and will call them directly for colonoscopy. Pt instructed to call [facility] back if she has trouble scheduling.   Pt also inquired about a bone density. Pt states it has to [facility] (same place she got her mammogram) because of her insurance.

## 2024-07-18 ENCOUNTER — Other Ambulatory Visit: Payer: Self-pay

## 2024-07-20 ENCOUNTER — Other Ambulatory Visit: Payer: Self-pay

## 2024-08-15 ENCOUNTER — Telehealth: Payer: Self-pay | Admitting: Student

## 2024-08-15 NOTE — Telephone Encounter (Signed)
 Patient's Bone density scan has been placed and to be signed by an attending and faxed to [facility] in [place].  Copied from CRM [id number]. Topic: Appointments - Scheduling Inquiry for Clinic >> Aug 15, 2024 10:45 AM Cassandra Park wrote: Reason for CRM: Patient is calling in to schedule her bone density scan, patient only wanted to speak to the office schedulers as she stated they schedule these testings for her. Please advise the patient.

## 2024-08-17 ENCOUNTER — Other Ambulatory Visit: Payer: Self-pay

## 2024-08-17 ENCOUNTER — Other Ambulatory Visit: Payer: Self-pay | Admitting: Student

## 2024-08-17 DIAGNOSIS — K219 Gastro-esophageal reflux disease without esophagitis: Secondary | ICD-10-CM

## 2024-08-17 MED ORDER — PANTOPRAZOLE SODIUM 40 MG PO TBEC
40.0000 mg | DELAYED_RELEASE_TABLET | Freq: Every day | ORAL | 2 refills | Status: AC | PRN
  Filled 2024-08-17: qty 30, 30d supply, fill #0
  Filled 2024-09-13: qty 30, 30d supply, fill #1
  Filled 2024-10-11: qty 30, 30d supply, fill #2

## 2024-08-17 NOTE — Telephone Encounter (Unsigned)
 Copied from CRM [id number]. Topic: Referral - Question >> Aug 16, 2024 12:04 PM DeAngela L wrote: Reason for CRM: Patient is calling to ask if the doctor office can put in a referral for a bone density test for her this was discussed previously when she spoke with the referral department and the patient states she had not heard anything additional   Pt num [phone] (M)  The patient states her insurance is accepted at the location where she had her mammogram done please schedule the bone density at the same location

## 2024-08-17 NOTE — Telephone Encounter (Signed)
 Medication sent to pharmacy

## 2024-08-30 LAB — HM DEXA SCAN: HM Dexa Scan: NORMAL

## 2024-09-05 ENCOUNTER — Telehealth: Payer: Self-pay | Admitting: *Deleted

## 2024-09-05 NOTE — Telephone Encounter (Signed)
 I called [employer] at the telephone# [phone]. The representative stated pt has applied to their Chronic Condition Plan. Office fax # given for [employer] to fax us  the paperwork they are requiring.

## 2024-09-05 NOTE — Telephone Encounter (Signed)
 Copied from CRM #[id number]. Topic: General - Other >> Sep 05, 2024  9:17 AM Cassandra Park wrote: Reason for CRM: Patient called in stating that her new insurance [employer] was trying to get Park list of all of her medications. Patient states that the insurance company had Park woman doctors name down on her paperwork instead of Dr. Norrine and she knows he is her PCP. Patients line disconnected. >> Sep 05, 2024  9:28 AM Cassandra Park wrote: Patient Cassandra Park called back and stated that her new insurance is [employer] and they need Park medication list and medical history.  [phone] is the number they gave her.  Please advise. Please call patient.

## 2024-09-05 NOTE — Telephone Encounter (Signed)
 Copied from CRM #[id number]. Topic: General - Other >> Sep 05, 2024  9:17 AM Rosaria A wrote: Reason for CRM: Patient called in stating that her new insurance [employer] was trying to get a list of all of her medications. Patient states that the insurance company had a woman doctors name down on her paperwork instead of Dr. Norrine and she knows he is her PCP. Patients line disconnected. >> Sep 05, 2024  9:28 AM Marda MATSU wrote: Patient Dohner called back and stated that her new insurance is [employer] and they need a medication list and medical history.  [phone] is the number they gave her.  Please advise. Please call patient.

## 2024-09-06 NOTE — Telephone Encounter (Signed)
 I did received the form from [facility] health, the form is in the blue team box under miscellaneous. Waiting for the doctor to complete the form.

## 2024-09-13 ENCOUNTER — Other Ambulatory Visit: Payer: Self-pay | Admitting: Student

## 2024-09-13 ENCOUNTER — Other Ambulatory Visit: Payer: Self-pay

## 2024-09-13 DIAGNOSIS — I10 Essential (primary) hypertension: Secondary | ICD-10-CM

## 2024-09-13 MED ORDER — AMLODIPINE BESYLATE 10 MG PO TABS
10.0000 mg | ORAL_TABLET | Freq: Every day | ORAL | 3 refills | Status: AC
  Filled 2024-09-13: qty 90, 90d supply, fill #0

## 2024-09-13 MED ORDER — CARVEDILOL 6.25 MG PO TABS
6.2500 mg | ORAL_TABLET | Freq: Two times a day (BID) | ORAL | 3 refills | Status: AC
  Filled 2024-09-13: qty 180, 90d supply, fill #0

## 2024-09-13 NOTE — Telephone Encounter (Signed)
 Medication sent to pharmacy

## 2024-09-14 ENCOUNTER — Other Ambulatory Visit: Payer: Self-pay

## 2024-09-16 ENCOUNTER — Telehealth: Payer: Self-pay | Admitting: *Deleted

## 2024-09-16 ENCOUNTER — Other Ambulatory Visit: Payer: Self-pay

## 2024-09-16 NOTE — Telephone Encounter (Signed)
 Copied from CRM #[id number]. Topic: General - Other >> Sep 05, 2024  9:17 AM Cassandra Park wrote: Reason for CRM: Patient called in stating that her new insurance [employer] was trying to get Park list of all of her medications. Patient states that the insurance company had Park woman doctors name down on her paperwork instead of Dr. Norrine and she knows he is her PCP. Patients line disconnected. >> Sep 16, 2024 11:41 AM Cassandra Park wrote: Cassandra Park with [employer] calling on status of patient been approved for- chronic condition specialist plan , sent request originally on 09/05/24 Please contact [employer] at  [phone] opt 1    >> Sep 05, 2024  9:28 AM Cassandra Park wrote: Patient Cassandra Park called back and stated that her new insurance is [employer] and they need Park medication list and medical history.  [phone] is the number they gave her.  Please advise. Please call patient.

## 2024-09-19 ENCOUNTER — Telehealth: Payer: Self-pay | Admitting: *Deleted

## 2024-09-19 NOTE — Telephone Encounter (Signed)
 RC to [facility].  Unable to speak to [facility] as no extension was left in the message.                                            Copied from CRM [id number]. Topic: General - Other >> Sep 16, 2024  3:15 PM Diannia H wrote: Reason for CRM: Mitzi with [employer] calling on status of patient been approved for- chronic condition specialist plan. She is needing to verify the diagnosis is of the patient. Could you assist? Callback number is [phone] Option 1

## 2024-09-19 NOTE — Telephone Encounter (Signed)
 Unable to reach an Agent at [facility].                                                Copied from CRM #[id number]. Topic: General - Other >> Sep 05, 2024  9:17 AM Cassandra Park wrote: Reason for CRM: Cassandra called in stating that her new insurance Cassandra Park was trying to get Park list of all of her medications. Cassandra states that the insurance company had Park woman doctors name down on her paperwork instead of Cassandra Park and she knows he is her PCP. Patients line disconnected. >> Sep 16, 2024 11:41 AM Cassandra Park wrote: Cassandra Park with [employer] calling on status of Cassandra been approved for- chronic condition specialist plan , sent request originally on 09/05/24 Please contact [employer] at  [phone] opt 1    >> Sep 05, 2024  9:28 AM Cassandra Park wrote: Cassandra Park called back and stated that her new insurance is [employer] and they need Park medication list and medical history.  [phone] is the number they gave her.  Please advise. Please call Cassandra.

## 2024-09-19 NOTE — Telephone Encounter (Signed)
 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 90 tablet 3   atorvastatin  (LIPITOR) 20 MG tablet Take 1 tablet (20 mg total) by mouth daily. 90 tablet 3   b complex vitamins capsule Take 1 capsule by mouth daily. 90 capsule 2   calcium -vitamin D (OSCAL WITH D 500-200) 500-200 MG-UNIT per tablet Take 1 tablet by mouth 2 (two) times daily.     carvedilol  (COREG ) 6.25 MG tablet Take 1 tablet (6.25 mg total) by mouth 2 (two) times daily with a meal. 180 tablet 3   metFORMIN  (GLUCOPHAGE ) 1000 MG tablet Take 0.5 tablets (500 mg total) by mouth daily with breakfast AND 1 tablet (1,000 mg total) daily before supper. 90 tablet 5   pantoprazole  (PROTONIX ) 40 MG tablet Take 1 tablet (40 mg total) by mouth daily as needed (for heartburn). 30 tablet 2   triamterene -hydrochlorothiazide  (MAXZIDE -25) 37.5-25 MG tablet Take 1 tablet by mouth once daily. 90 tablet 3   No current facility-administered medications on file prior to visit.   Ozell Kung MD 09/19/2024, 9:45 AM

## 2024-09-20 ENCOUNTER — Encounter: Payer: Self-pay | Admitting: Internal Medicine

## 2024-09-20 NOTE — Telephone Encounter (Signed)
 I faxed Cassandra Park medication lists to [employer], confirmation went through.

## 2024-10-10 ENCOUNTER — Ambulatory Visit: Payer: Self-pay

## 2024-10-11 ENCOUNTER — Other Ambulatory Visit: Payer: Self-pay

## 2024-10-13 ENCOUNTER — Other Ambulatory Visit: Payer: Self-pay

## 2024-10-13 MED ORDER — ACCU-CHEK GUIDE TEST VI STRP
ORAL_STRIP | 3 refills | Status: AC
  Filled 2024-10-13: qty 100, 33d supply, fill #0

## 2024-10-13 MED ORDER — ACCU-CHEK GUIDE ME W/DEVICE KIT
PACK | 0 refills | Status: AC
  Filled 2024-10-13: qty 1, 1d supply, fill #0

## 2024-10-13 MED ORDER — ACCU-CHEK FASTCLIX LANCET KIT: PACK | 3 refills | Status: AC

## 2024-10-13 MED ORDER — ACCU-CHEK SOFTCLIX LANCETS MISC
3 refills | Status: AC
  Filled 2024-10-13: qty 100, 33d supply, fill #0

## 2024-10-14 ENCOUNTER — Other Ambulatory Visit: Payer: Self-pay

## 2024-10-17 ENCOUNTER — Ambulatory Visit: Admitting: Student

## 2024-10-24 ENCOUNTER — Ambulatory Visit: Admitting: Student
